# Patient Record
Sex: Female | Born: 1970 | Race: White | Hispanic: No | Marital: Single | Smoking: Current every day smoker
Health system: Southern US, Community
[De-identification: ages and names within clinical notes are randomized; demographics above are authoritative.]

## PROBLEM LIST (undated history)

## (undated) DIAGNOSIS — F32A Depression, unspecified: Secondary | ICD-10-CM

## (undated) DIAGNOSIS — J8410 Pulmonary fibrosis, unspecified: Secondary | ICD-10-CM

## (undated) DIAGNOSIS — J189 Pneumonia, unspecified organism: Secondary | ICD-10-CM

## (undated) DIAGNOSIS — E039 Hypothyroidism, unspecified: Secondary | ICD-10-CM

## (undated) DIAGNOSIS — K219 Gastro-esophageal reflux disease without esophagitis: Secondary | ICD-10-CM

## (undated) DIAGNOSIS — N3010 Interstitial cystitis (chronic) without hematuria: Secondary | ICD-10-CM

## (undated) DIAGNOSIS — IMO0002 Reserved for concepts with insufficient information to code with codable children: Secondary | ICD-10-CM

## (undated) DIAGNOSIS — F319 Bipolar disorder, unspecified: Secondary | ICD-10-CM

## (undated) DIAGNOSIS — F419 Anxiety disorder, unspecified: Secondary | ICD-10-CM

## (undated) DIAGNOSIS — F329 Major depressive disorder, single episode, unspecified: Secondary | ICD-10-CM

## (undated) DIAGNOSIS — L409 Psoriasis, unspecified: Secondary | ICD-10-CM

## (undated) DIAGNOSIS — J45909 Unspecified asthma, uncomplicated: Secondary | ICD-10-CM

## (undated) DIAGNOSIS — R569 Unspecified convulsions: Secondary | ICD-10-CM

## (undated) HISTORY — PX: WISDOM TOOTH EXTRACTION: SHX21

## (undated) HISTORY — PX: OTHER SURGICAL HISTORY: SHX169

## (undated) HISTORY — PX: TOTAL KNEE ARTHROPLASTY: SHX125

---

## 1998-02-28 ENCOUNTER — Emergency Department (HOSPITAL_COMMUNITY): Admission: EM | Admit: 1998-02-28 | Discharge: 1998-02-28 | Payer: Self-pay | Admitting: Emergency Medicine

## 1998-04-10 ENCOUNTER — Encounter: Admission: RE | Admit: 1998-04-10 | Discharge: 1998-07-09 | Payer: Self-pay | Admitting: Orthopedic Surgery

## 1998-04-12 ENCOUNTER — Other Ambulatory Visit: Admission: RE | Admit: 1998-04-12 | Discharge: 1998-04-12 | Payer: Self-pay | Admitting: *Deleted

## 1998-04-17 ENCOUNTER — Other Ambulatory Visit: Admission: RE | Admit: 1998-04-17 | Discharge: 1998-04-17 | Payer: Self-pay | Admitting: *Deleted

## 1998-04-24 ENCOUNTER — Ambulatory Visit (HOSPITAL_COMMUNITY): Admission: RE | Admit: 1998-04-24 | Discharge: 1998-04-24 | Payer: Self-pay | Admitting: *Deleted

## 1998-06-25 ENCOUNTER — Emergency Department (HOSPITAL_COMMUNITY): Admission: EM | Admit: 1998-06-25 | Discharge: 1998-06-25 | Payer: Self-pay | Admitting: Emergency Medicine

## 1998-06-27 ENCOUNTER — Other Ambulatory Visit: Admission: RE | Admit: 1998-06-27 | Discharge: 1998-06-27 | Payer: Self-pay | Admitting: *Deleted

## 1998-07-02 ENCOUNTER — Inpatient Hospital Stay: Admission: EM | Admit: 1998-07-02 | Discharge: 1998-07-03 | Payer: Self-pay | Admitting: *Deleted

## 1998-07-04 ENCOUNTER — Ambulatory Visit (HOSPITAL_COMMUNITY): Admission: RE | Admit: 1998-07-04 | Discharge: 1998-07-04 | Payer: Self-pay | Admitting: Urology

## 1998-07-13 ENCOUNTER — Ambulatory Visit (HOSPITAL_COMMUNITY): Admission: RE | Admit: 1998-07-13 | Discharge: 1998-07-13 | Payer: Self-pay | Admitting: Urology

## 1998-07-23 ENCOUNTER — Emergency Department (HOSPITAL_COMMUNITY): Admission: EM | Admit: 1998-07-23 | Discharge: 1998-07-23 | Payer: Self-pay | Admitting: Emergency Medicine

## 1998-10-12 ENCOUNTER — Emergency Department (HOSPITAL_COMMUNITY): Admission: EM | Admit: 1998-10-12 | Discharge: 1998-10-12 | Payer: Self-pay | Admitting: Emergency Medicine

## 1998-10-12 ENCOUNTER — Encounter: Payer: Self-pay | Admitting: Emergency Medicine

## 1998-10-18 ENCOUNTER — Encounter: Payer: Self-pay | Admitting: *Deleted

## 1998-10-18 ENCOUNTER — Ambulatory Visit (HOSPITAL_COMMUNITY): Admission: RE | Admit: 1998-10-18 | Discharge: 1998-10-18 | Payer: Self-pay | Admitting: *Deleted

## 1998-11-13 ENCOUNTER — Encounter: Admission: RE | Admit: 1998-11-13 | Discharge: 1999-02-11 | Payer: Self-pay

## 1999-01-24 ENCOUNTER — Ambulatory Visit (HOSPITAL_COMMUNITY): Admission: RE | Admit: 1999-01-24 | Discharge: 1999-01-24 | Payer: Self-pay | Admitting: *Deleted

## 1999-02-01 ENCOUNTER — Ambulatory Visit (HOSPITAL_COMMUNITY): Admission: RE | Admit: 1999-02-01 | Discharge: 1999-02-01 | Payer: Self-pay | Admitting: Pulmonary Disease

## 1999-02-01 ENCOUNTER — Encounter: Payer: Self-pay | Admitting: Pulmonary Disease

## 1999-02-05 ENCOUNTER — Ambulatory Visit: Admission: RE | Admit: 1999-02-05 | Discharge: 1999-02-05 | Payer: Self-pay | Admitting: Pulmonary Disease

## 1999-04-01 ENCOUNTER — Encounter: Admission: RE | Admit: 1999-04-01 | Discharge: 1999-05-10 | Payer: Self-pay | Admitting: *Deleted

## 1999-04-04 ENCOUNTER — Ambulatory Visit (HOSPITAL_COMMUNITY): Admission: RE | Admit: 1999-04-04 | Discharge: 1999-04-04 | Payer: Self-pay | Admitting: *Deleted

## 1999-04-04 ENCOUNTER — Encounter: Payer: Self-pay | Admitting: *Deleted

## 1999-04-26 ENCOUNTER — Emergency Department (HOSPITAL_COMMUNITY): Admission: EM | Admit: 1999-04-26 | Discharge: 1999-04-26 | Payer: Self-pay | Admitting: Emergency Medicine

## 1999-08-27 ENCOUNTER — Encounter: Payer: Self-pay | Admitting: Emergency Medicine

## 1999-08-27 ENCOUNTER — Emergency Department (HOSPITAL_COMMUNITY): Admission: EM | Admit: 1999-08-27 | Discharge: 1999-08-27 | Payer: Self-pay | Admitting: Emergency Medicine

## 1999-10-08 ENCOUNTER — Encounter: Payer: Self-pay | Admitting: *Deleted

## 1999-10-08 ENCOUNTER — Inpatient Hospital Stay (HOSPITAL_COMMUNITY): Admission: RE | Admit: 1999-10-08 | Discharge: 1999-10-11 | Payer: Self-pay | Admitting: *Deleted

## 1999-10-10 ENCOUNTER — Encounter: Payer: Self-pay | Admitting: Pulmonary Disease

## 1999-10-17 ENCOUNTER — Encounter: Admission: RE | Admit: 1999-10-17 | Discharge: 2000-01-15 | Payer: Self-pay | Admitting: Specialist

## 2000-03-04 ENCOUNTER — Ambulatory Visit (HOSPITAL_COMMUNITY): Admission: RE | Admit: 2000-03-04 | Discharge: 2000-03-04 | Payer: Self-pay | Admitting: Urology

## 2000-05-04 ENCOUNTER — Ambulatory Visit (HOSPITAL_COMMUNITY): Admission: RE | Admit: 2000-05-04 | Discharge: 2000-05-04 | Payer: Self-pay | Admitting: *Deleted

## 2000-05-04 ENCOUNTER — Encounter: Payer: Self-pay | Admitting: *Deleted

## 2000-06-05 ENCOUNTER — Encounter: Admission: RE | Admit: 2000-06-05 | Discharge: 2000-06-05 | Payer: Self-pay | Admitting: Specialist

## 2000-07-27 ENCOUNTER — Encounter: Payer: Self-pay | Admitting: Specialist

## 2000-07-27 ENCOUNTER — Encounter: Admission: RE | Admit: 2000-07-27 | Discharge: 2000-07-27 | Payer: Self-pay | Admitting: Specialist

## 2000-07-30 ENCOUNTER — Encounter: Admission: RE | Admit: 2000-07-30 | Discharge: 2000-07-30 | Payer: Self-pay | Admitting: Specialist

## 2000-07-30 ENCOUNTER — Encounter: Payer: Self-pay | Admitting: Specialist

## 2000-08-25 ENCOUNTER — Emergency Department (HOSPITAL_COMMUNITY): Admission: EM | Admit: 2000-08-25 | Discharge: 2000-08-25 | Payer: Self-pay | Admitting: Emergency Medicine

## 2003-05-09 ENCOUNTER — Encounter: Admission: RE | Admit: 2003-05-09 | Discharge: 2003-06-07 | Payer: Self-pay | Admitting: Family Medicine

## 2003-07-12 ENCOUNTER — Encounter: Admission: RE | Admit: 2003-07-12 | Discharge: 2003-09-13 | Payer: Self-pay | Admitting: Orthopaedic Surgery

## 2004-03-25 ENCOUNTER — Encounter: Admission: RE | Admit: 2004-03-25 | Discharge: 2004-05-20 | Payer: Self-pay | Admitting: Orthopaedic Surgery

## 2004-05-06 ENCOUNTER — Encounter: Admission: RE | Admit: 2004-05-06 | Discharge: 2004-05-06 | Payer: Self-pay | Admitting: Orthopaedic Surgery

## 2004-05-23 ENCOUNTER — Ambulatory Visit (HOSPITAL_COMMUNITY): Admission: RE | Admit: 2004-05-23 | Discharge: 2004-05-23 | Payer: Self-pay | Admitting: *Deleted

## 2004-06-04 ENCOUNTER — Ambulatory Visit (HOSPITAL_COMMUNITY): Admission: RE | Admit: 2004-06-04 | Discharge: 2004-06-04 | Payer: Self-pay | Admitting: Urology

## 2004-06-18 ENCOUNTER — Ambulatory Visit (HOSPITAL_COMMUNITY): Admission: RE | Admit: 2004-06-18 | Discharge: 2004-06-18 | Payer: Self-pay | Admitting: Urology

## 2004-09-10 ENCOUNTER — Ambulatory Visit (HOSPITAL_COMMUNITY): Admission: RE | Admit: 2004-09-10 | Discharge: 2004-09-10 | Payer: Self-pay | Admitting: Urology

## 2004-12-24 ENCOUNTER — Encounter: Admission: RE | Admit: 2004-12-24 | Discharge: 2005-02-20 | Payer: Self-pay

## 2005-03-05 ENCOUNTER — Encounter: Admission: RE | Admit: 2005-03-05 | Discharge: 2005-03-18 | Payer: Self-pay | Admitting: Orthopaedic Surgery

## 2005-04-01 ENCOUNTER — Encounter: Admission: RE | Admit: 2005-04-01 | Discharge: 2005-05-16 | Payer: Self-pay | Admitting: Orthopaedic Surgery

## 2005-05-22 ENCOUNTER — Encounter: Admission: RE | Admit: 2005-05-22 | Discharge: 2005-06-24 | Payer: Self-pay | Admitting: Orthopaedic Surgery

## 2005-08-12 ENCOUNTER — Ambulatory Visit (HOSPITAL_COMMUNITY): Admission: RE | Admit: 2005-08-12 | Discharge: 2005-08-12 | Payer: Self-pay | Admitting: Urology

## 2005-09-19 ENCOUNTER — Ambulatory Visit (HOSPITAL_COMMUNITY): Admission: RE | Admit: 2005-09-19 | Discharge: 2005-09-19 | Payer: Self-pay | Admitting: Surgery

## 2006-03-31 ENCOUNTER — Ambulatory Visit (HOSPITAL_BASED_OUTPATIENT_CLINIC_OR_DEPARTMENT_OTHER): Admission: RE | Admit: 2006-03-31 | Discharge: 2006-03-31 | Payer: Self-pay | Admitting: Urology

## 2006-04-01 ENCOUNTER — Encounter: Payer: Self-pay | Admitting: Emergency Medicine

## 2006-04-30 ENCOUNTER — Encounter: Admission: RE | Admit: 2006-04-30 | Discharge: 2006-07-28 | Payer: Self-pay | Admitting: Orthopaedic Surgery

## 2006-10-28 ENCOUNTER — Encounter: Admission: RE | Admit: 2006-10-28 | Discharge: 2006-11-25 | Payer: Self-pay | Admitting: Orthopaedic Surgery

## 2006-11-26 ENCOUNTER — Encounter: Admission: RE | Admit: 2006-11-26 | Discharge: 2007-01-04 | Payer: Self-pay | Admitting: Orthopaedic Surgery

## 2006-12-18 ENCOUNTER — Encounter: Admission: RE | Admit: 2006-12-18 | Discharge: 2006-12-18 | Payer: Self-pay | Admitting: Orthopaedic Surgery

## 2007-03-04 ENCOUNTER — Encounter: Admission: RE | Admit: 2007-03-04 | Discharge: 2007-05-04 | Payer: Self-pay | Admitting: Orthopaedic Surgery

## 2007-12-22 ENCOUNTER — Encounter: Admission: RE | Admit: 2007-12-22 | Discharge: 2007-12-22 | Payer: Self-pay | Admitting: Orthopedic Surgery

## 2008-01-06 ENCOUNTER — Encounter: Admission: RE | Admit: 2008-01-06 | Discharge: 2008-02-02 | Payer: Self-pay | Admitting: Orthopedic Surgery

## 2009-02-21 ENCOUNTER — Emergency Department (HOSPITAL_COMMUNITY): Admission: EM | Admit: 2009-02-21 | Discharge: 2009-02-22 | Payer: Self-pay | Admitting: Emergency Medicine

## 2009-05-23 ENCOUNTER — Emergency Department (HOSPITAL_BASED_OUTPATIENT_CLINIC_OR_DEPARTMENT_OTHER): Admission: EM | Admit: 2009-05-23 | Discharge: 2009-05-23 | Payer: Self-pay | Admitting: Emergency Medicine

## 2009-06-18 ENCOUNTER — Emergency Department (HOSPITAL_BASED_OUTPATIENT_CLINIC_OR_DEPARTMENT_OTHER): Admission: EM | Admit: 2009-06-18 | Discharge: 2009-06-18 | Payer: Self-pay | Admitting: Emergency Medicine

## 2009-06-21 ENCOUNTER — Emergency Department (HOSPITAL_COMMUNITY): Admission: EM | Admit: 2009-06-21 | Discharge: 2009-06-21 | Payer: Self-pay | Admitting: Emergency Medicine

## 2009-06-28 ENCOUNTER — Emergency Department (HOSPITAL_COMMUNITY): Admission: EM | Admit: 2009-06-28 | Discharge: 2009-06-28 | Payer: Self-pay | Admitting: Emergency Medicine

## 2009-06-28 ENCOUNTER — Inpatient Hospital Stay (HOSPITAL_COMMUNITY): Admission: EM | Admit: 2009-06-28 | Discharge: 2009-07-01 | Payer: Self-pay | Admitting: Emergency Medicine

## 2009-06-29 ENCOUNTER — Ambulatory Visit: Payer: Self-pay | Admitting: Internal Medicine

## 2009-07-31 ENCOUNTER — Telehealth: Payer: Self-pay | Admitting: Internal Medicine

## 2009-09-04 ENCOUNTER — Emergency Department (HOSPITAL_BASED_OUTPATIENT_CLINIC_OR_DEPARTMENT_OTHER): Admission: EM | Admit: 2009-09-04 | Discharge: 2009-09-04 | Payer: Self-pay | Admitting: Emergency Medicine

## 2009-09-04 ENCOUNTER — Ambulatory Visit: Payer: Self-pay | Admitting: Diagnostic Radiology

## 2009-12-17 ENCOUNTER — Emergency Department (HOSPITAL_COMMUNITY): Admission: EM | Admit: 2009-12-17 | Discharge: 2009-12-17 | Payer: Self-pay | Admitting: Emergency Medicine

## 2010-01-01 ENCOUNTER — Encounter: Payer: Self-pay | Admitting: Internal Medicine

## 2010-01-01 ENCOUNTER — Encounter: Admission: RE | Admit: 2010-01-01 | Discharge: 2010-01-01 | Payer: Self-pay | Admitting: Rheumatology

## 2010-01-28 ENCOUNTER — Encounter: Admission: RE | Admit: 2010-01-28 | Discharge: 2010-03-11 | Payer: Self-pay | Admitting: Orthopaedic Surgery

## 2010-01-30 ENCOUNTER — Emergency Department (HOSPITAL_COMMUNITY): Admission: EM | Admit: 2010-01-30 | Discharge: 2010-01-30 | Payer: Self-pay | Admitting: Emergency Medicine

## 2010-03-01 ENCOUNTER — Telehealth: Payer: Self-pay | Admitting: Internal Medicine

## 2010-03-04 ENCOUNTER — Ambulatory Visit: Payer: Self-pay | Admitting: Internal Medicine

## 2010-03-04 DIAGNOSIS — J8410 Pulmonary fibrosis, unspecified: Secondary | ICD-10-CM | POA: Insufficient documentation

## 2010-03-04 DIAGNOSIS — F17210 Nicotine dependence, cigarettes, uncomplicated: Secondary | ICD-10-CM | POA: Insufficient documentation

## 2010-03-04 DIAGNOSIS — M329 Systemic lupus erythematosus, unspecified: Secondary | ICD-10-CM | POA: Insufficient documentation

## 2010-03-04 DIAGNOSIS — L408 Other psoriasis: Secondary | ICD-10-CM | POA: Insufficient documentation

## 2010-03-04 DIAGNOSIS — J45909 Unspecified asthma, uncomplicated: Secondary | ICD-10-CM | POA: Insufficient documentation

## 2010-03-10 ENCOUNTER — Emergency Department (HOSPITAL_BASED_OUTPATIENT_CLINIC_OR_DEPARTMENT_OTHER): Admission: EM | Admit: 2010-03-10 | Discharge: 2010-03-11 | Payer: Self-pay | Admitting: Emergency Medicine

## 2010-04-17 ENCOUNTER — Ambulatory Visit: Payer: Self-pay | Admitting: Internal Medicine

## 2010-04-27 ENCOUNTER — Emergency Department (HOSPITAL_BASED_OUTPATIENT_CLINIC_OR_DEPARTMENT_OTHER): Admission: EM | Admit: 2010-04-27 | Discharge: 2010-04-28 | Payer: Self-pay | Admitting: Emergency Medicine

## 2010-07-17 IMAGING — CR DG WRIST COMPLETE 3+V*R*
3 series · 3 of 3 positions shown · non-contrast
Comparison: None

CLINICAL DATA: Pain radial aspect right wrist, swelling right arm,
no known injury

RIGHT WRIST - COMPLETE 3 VIEWS

[x wrist pa right]
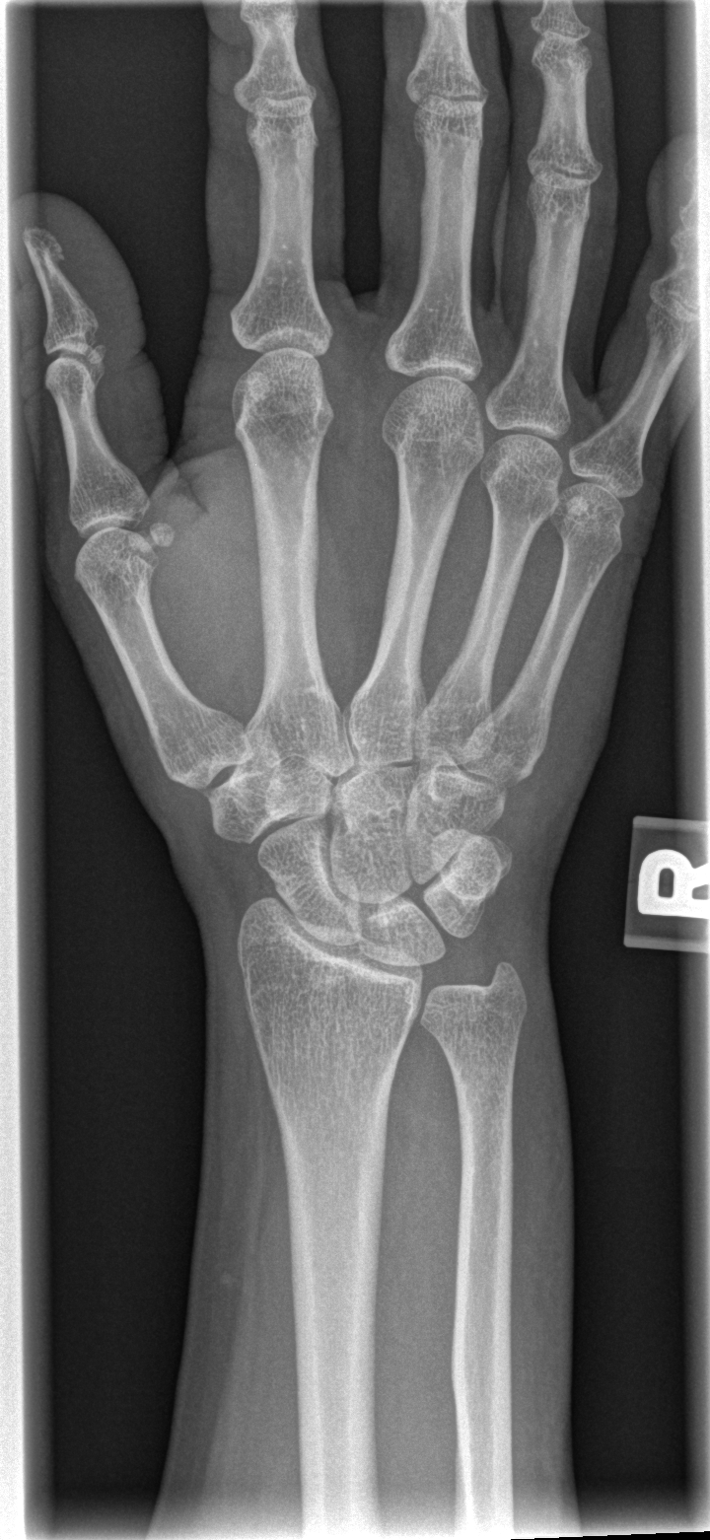

[x wrist obl right]
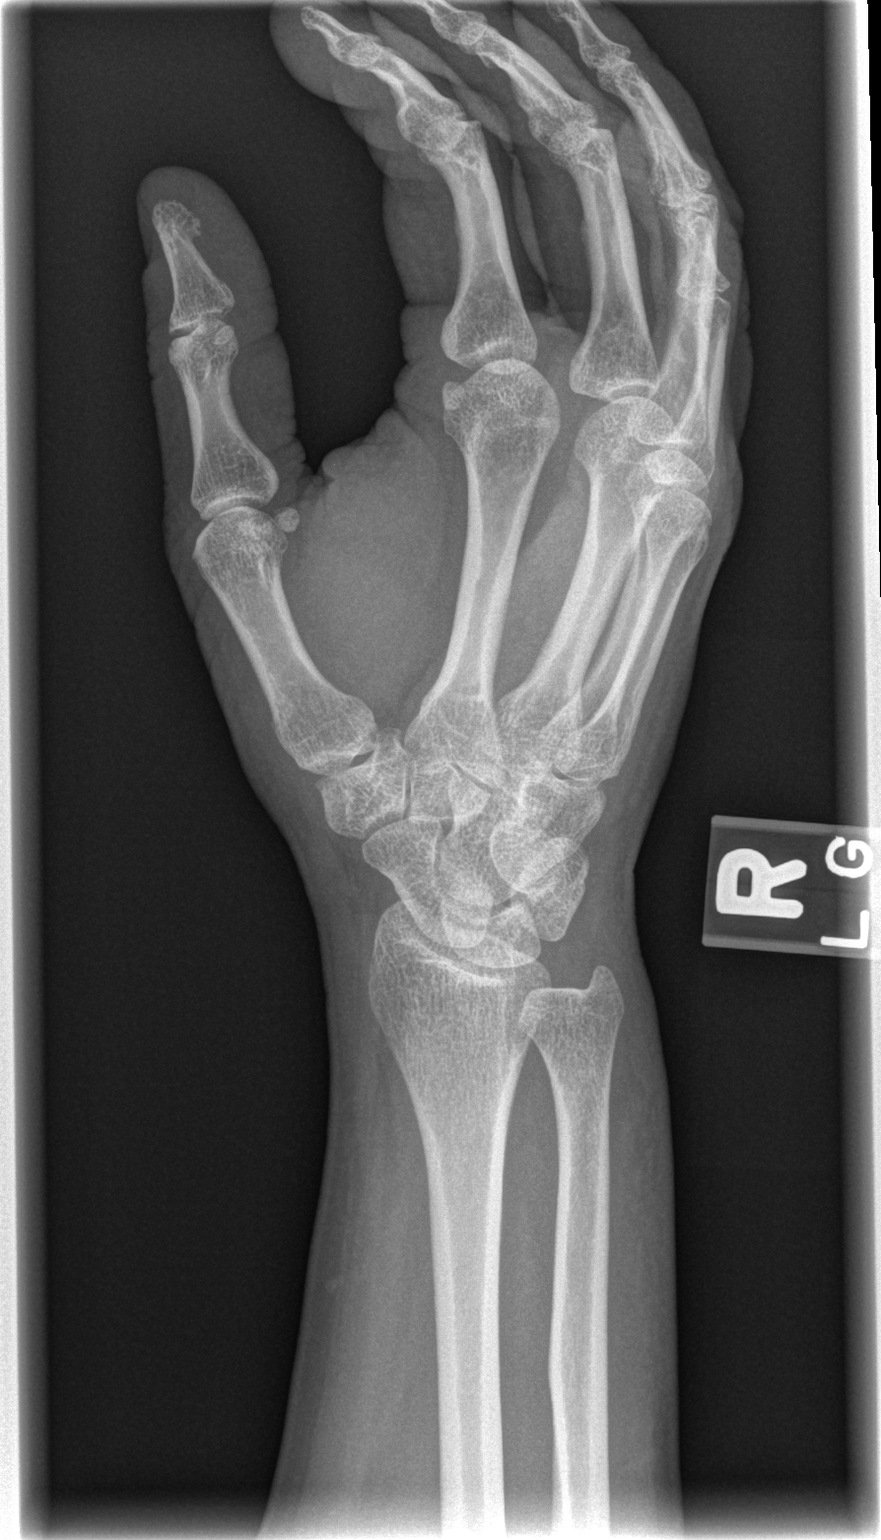

[x wrist lat right]
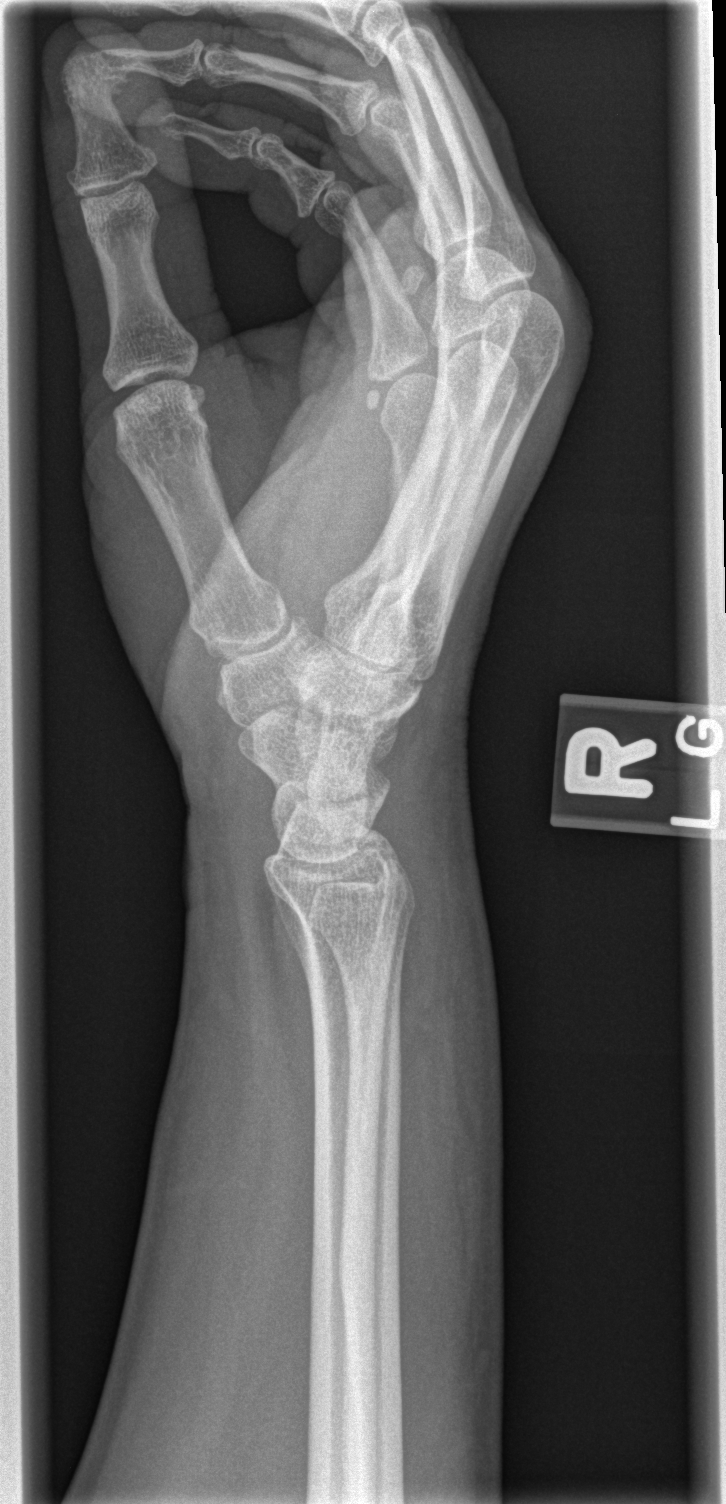

[3 of 3 positions shown; findings below may reference images not displayed]

FINDINGS: Bone mineralization normal.
Joint spaces preserved.
No acute fracture, dislocation, or bone destruction.
IMPRESSION: No acute abnormalities.

## 2010-08-14 ENCOUNTER — Encounter: Admission: RE | Admit: 2010-08-14 | Discharge: 2010-10-10 | Payer: Self-pay | Admitting: Orthopaedic Surgery

## 2010-08-14 ENCOUNTER — Encounter: Admission: RE | Admit: 2010-08-14 | Discharge: 2010-08-14 | Payer: Self-pay | Admitting: Orthopedic Surgery

## 2010-11-13 IMAGING — CR DG CHEST 2V
2 series · 2 of 2 positions shown · non-contrast
Comparison: The report of 10/10/1999

CLINICAL DATA: Chest pain.  Shortness of breath.  Possible
poisoning.  Nausea vomiting.

CHEST - 2 VIEW

[w chest pa]
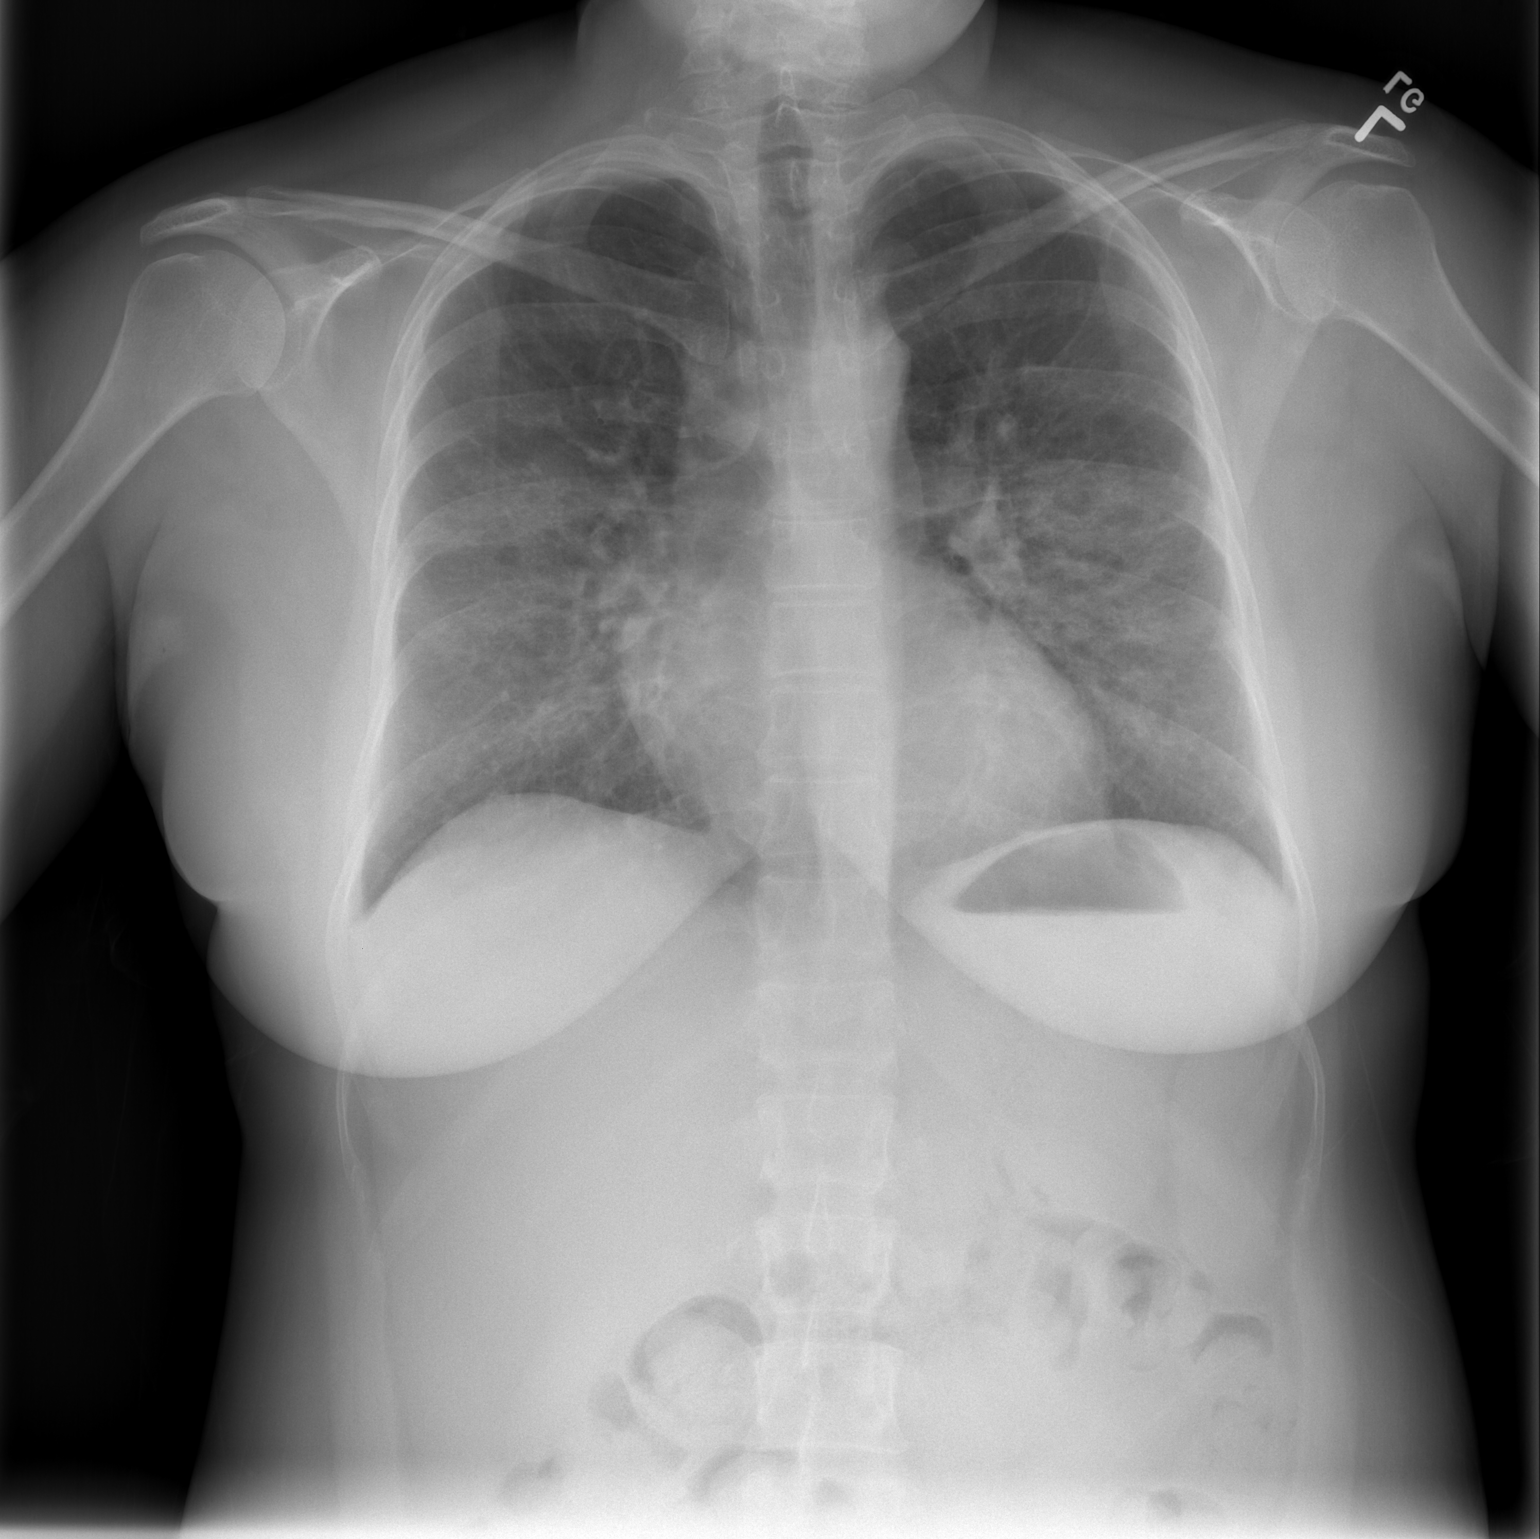

[w chest lat]
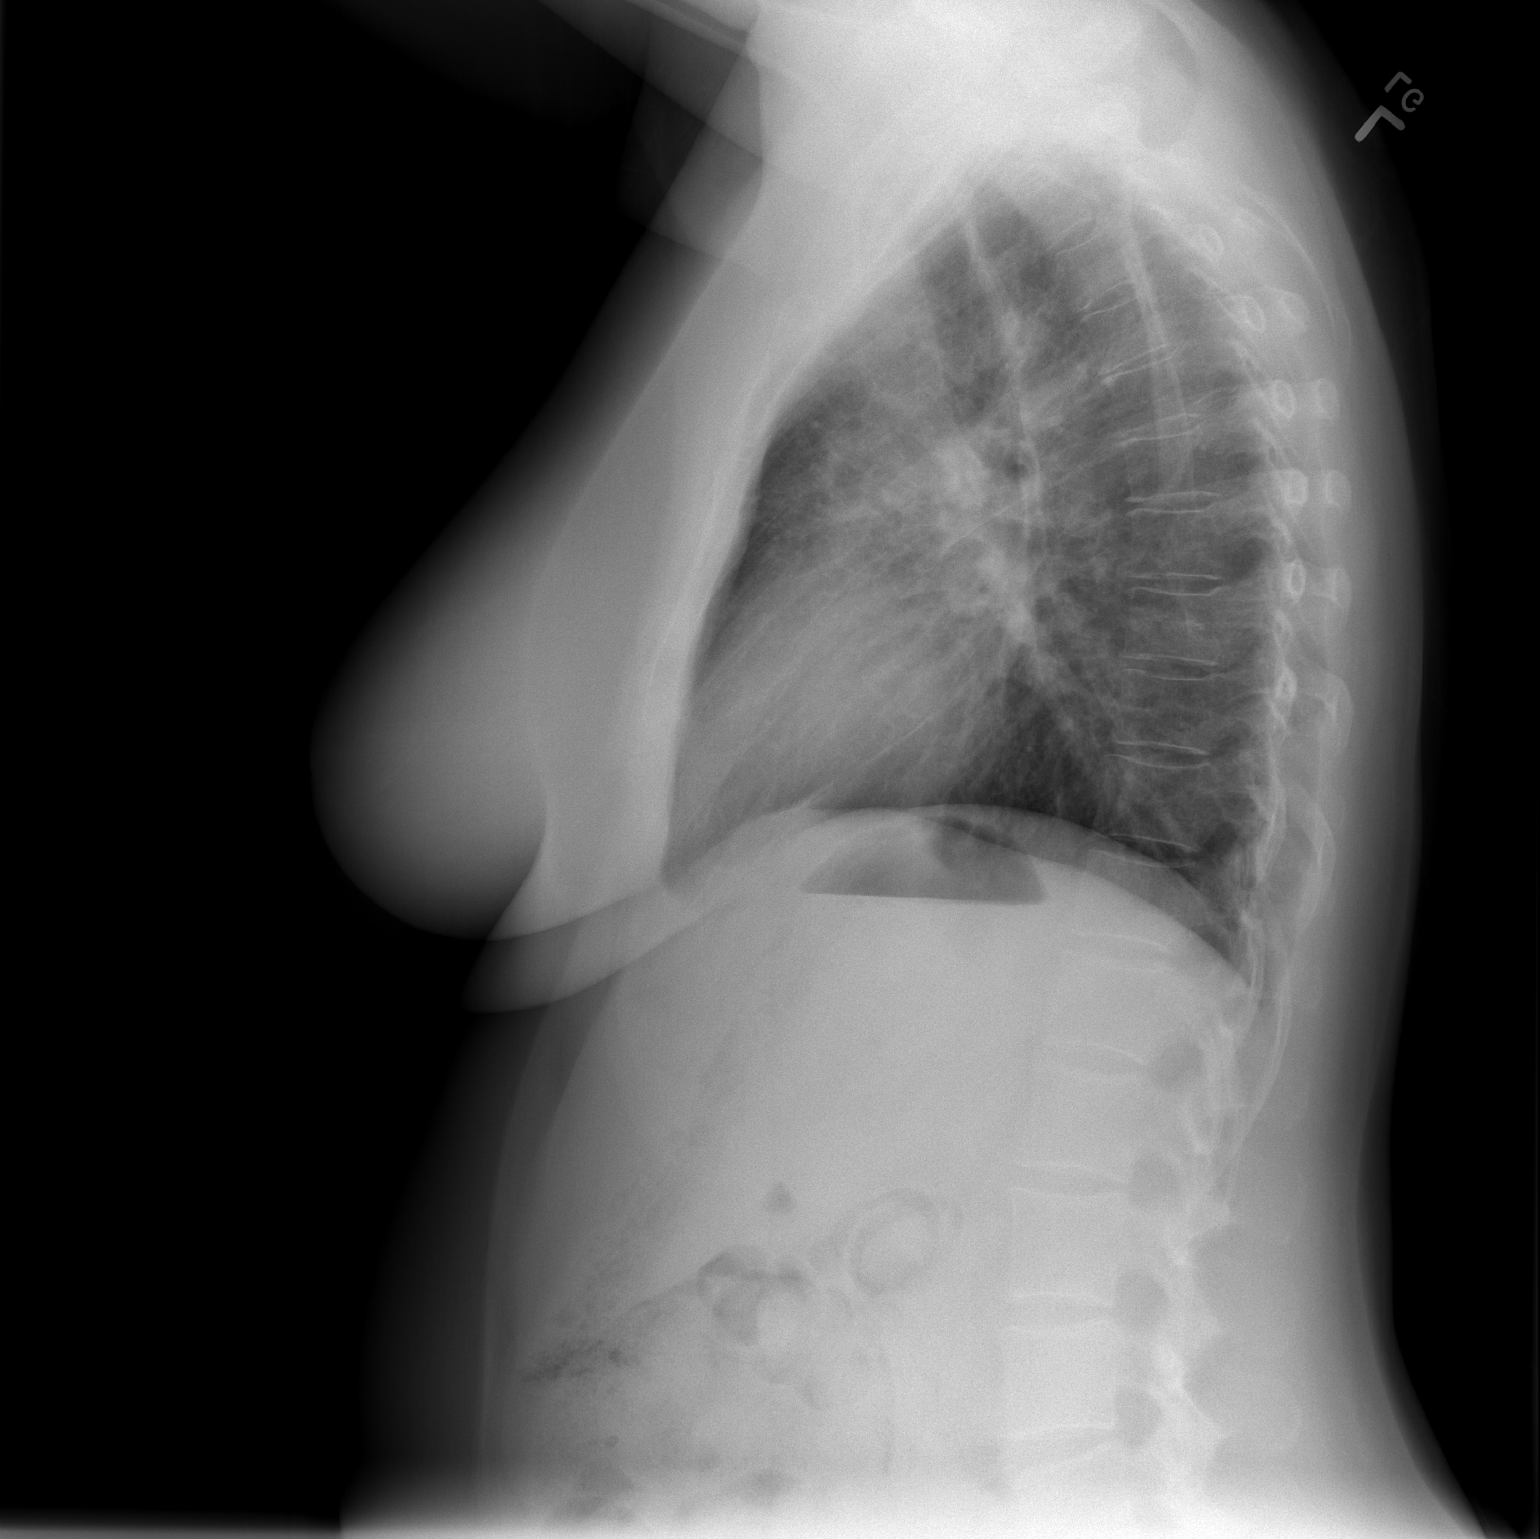

[2 of 2 positions shown; findings below may reference images not displayed]

FINDINGS: Suspect mild osteopenia. Midline trachea. Normal heart
size.  No definite mediastinal or hilar adenopathy.  Biapical
pleural thickening. No pleural effusion or pneumothorax. Bilateral,
left greater than right interstitial opacities.  These are
primarily perihilar in nature.  There may be concurrent airspace
disease in the perihilar lungs bilaterally.
IMPRESSION: Interstitial and possibly airspace opacities in a bilateral,
perihilar distribution.   Possible etiologies include  viral or
atypical bacterial (i.e. mycoplasma) infection, pulmonary edema,
and acute hypersensitivity.  If the patient's symptoms are chronic,
interstitial lung disease (sarcoidosis) should also be considered.

## 2010-11-28 ENCOUNTER — Encounter
Admission: RE | Admit: 2010-11-28 | Discharge: 2010-11-28 | Payer: Self-pay | Source: Home / Self Care | Attending: Family Medicine | Admitting: Family Medicine

## 2011-01-02 NOTE — Letter (Signed)
Summary: [facility]  [facility]   Imported By: Sherian Rein 03/13/2010 08:28:07  _____________________________________________________________________  External Attachment:    Type:   Image     Comment:   External Document

## 2011-01-02 NOTE — Progress Notes (Signed)
Summary: fax request (after 03/04/10) dr deveshwar's office  Phone Note From Other Clinic   Caller: dr deveshwar's office Call For: Tobey Lippard Summary of Call: please fax ov notes (after consult w/ dr Ada Holness 03/04/10) to dr Celine Mans deveshwar's office. fax # [phone]. contact # is [phone] Initial call taken by: Tivis Ringer, CNA,  March 01, 2010 10:25 AM  Follow-up for Phone Call        sent a reminder flag to leslie for 03-04-10 to fax ov note once pt is seen. Carron Curie CMA  March 01, 2010 10:43 AM

## 2011-01-02 NOTE — Assessment & Plan Note (Signed)
Summary: Pulmonary/ ? ILD    History of Present Illness: 38 yowf active smoker with bad arthritis being consiered for MTX with ? SLE  but ? of ILD so referred to pulmonary clinic March 04, 2010   March 04, 2010 cc ? ild no limitations can walk slow pace up hill and steps with minimal dry cough and no noct c/os.  Pt denies any significant sore throat, dysphagia, itching, sneezing,  nasal congestion or excess secretions,  fever, chills, sweats, unintended wt loss, pleuritic or exertional cp, hempoptysis, change in activity tolerance  orthopnea pnd or leg swelling.  Pt also denies any obvious fluctuation in symptoms with weather or environmental change or other alleviating or aggravating factors.    Carries dx of asthma but says can't tell ventolin helps her and doesn't use it.     Current Medications (verified): 1)  Humira Pen 40 Mg/0.80ml Kit (Adalimumab) .... As Directed 2)  Desmopressin Acetate 0.2 Mg Tabs (Desmopressin Acetate) .... 2 At Bedtime 3)  Abilify 20 Mg Tabs (Aripiprazole) .Marland Kitchen.. 1 Two Times A Day 4)  Seroquel 50 Mg Tabs (Quetiapine Fumarate) .Marland Kitchen.. 1 At Bedtime 5)  Prilosec 40 Mg Cpdr (Omeprazole) .Marland Kitchen.. 1 Once Daily 6)  Synthroid 50 Mcg Tabs (Levothyroxine Sodium) .Marland Kitchen.. 1 Once Daily 7)  Topamax 100 Mg Tabs (Topiramate) .Marland Kitchen.. 1 Once Daily 8)  Ventolin Hfa 108 (90 Base) Mcg/act Aers (Albuterol Sulfate) .... 2 Puffs Every 4-6 Hours As Needed 9)  Ms Contin 30 Mg Xr12h-Tab (Morphine Sulfate) .Marland Kitchen.. 1 Three Times A Day 10)  Norco 7.5-325 Mg Tabs (Hydrocodone-Acetaminophen) .Marland Kitchen.. 1 Every 12 Hours As Needed 11)  Xanax 0.5 Mg Tabs (Alprazolam) .Marland Kitchen.. 1 Four Times A Day  Allergies (verified): 1)  ! Doxycycline  Past History:  Past Medical History: LUPUS (ICD-710.0) ASTHMA (ICD-493.90) PSORIASIS (ICD-696.1)  Family History: Negative for respiratory diseases or atopy   Social History: Current smoker since age 37.  Smokes 1/2 ppd.  Review of Systems       The patient complains of  non-productive cough, acid heartburn, indigestion, weight change, anxiety, depression, and joint stiffness or pain.  The patient denies shortness of breath with activity, shortness of breath at rest, productive cough, coughing up blood, chest pain, irregular heartbeats, loss of appetite, abdominal pain, difficulty swallowing, sore throat, tooth/dental problems, headaches, nasal congestion/difficulty breathing through nose, sneezing, itching, ear ache, hand/feet swelling, rash, change in color of mucus, and fever.    Vital Signs:  Patient profile:   40 year old female Height:      58 inches Weight:      115.50 pounds BMI:     24.23  Vitals Entered By: Vernie Murders (March 04, 2010 11:15 AM)  O2 Flow:  Room air  Physical Exam  Additional Exam:  amb wf nad wt 115 HEENT: nl dentition, turbinates, and orophanx. Nl external ear canals without cough reflex Neck without JVD/Nodes/TM Lungs clear to A and P bilaterally without cough on insp or exp maneuvers RRR no s3 or murmur or increase in P2 Abd soft and benign with nl excursion in the supine position. 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  For now the most pressing issue is the smoking.  Needs baseline pft's     Problem # 2:  SMOKER (ICD-305.1) Discussed but not ready to committ to quit at this point - emphasized risks involved in continuing smoking and that patient should consider these in the context of the cost of smoking relative to the benefit obtained.  Had luck with chantix previously but on psych meds with roomate who smokes so reluctant to re-rx without psych/social support in place.    Medications Added to Medication List This Visit: 1)  Humira Pen 40 Mg/0.42ml Kit (Adalimumab) .... As directed 2)  Desmopressin Acetate 0.2 Mg Tabs (Desmopressin acetate) .... 2 at bedtime 3)  Abilify 20 Mg Tabs (Aripiprazole) .Marland Kitchen.. 1 two times a day 4)  Seroquel 50 Mg Tabs (Quetiapine fumarate) .Marland Kitchen.. 1 at bedtime 5)  Prilosec 40 Mg Cpdr (Omeprazole) .Marland Kitchen.. 1 once daily 6)  Synthroid 50 Mcg Tabs (Levothyroxine sodium) .Marland Kitchen.. 1 once daily 7)  Topamax 100 Mg Tabs (Topiramate) .Marland Kitchen.. 1 once daily 8)  Ventolin Hfa 108 (90 Base) Mcg/act Aers (Albuterol sulfate) .... 2 puffs every 4-6 hours as needed 9)  Ms Contin 30 Mg Xr12h-tab (Morphine sulfate) .Marland Kitchen.. 1 three times a day 10)  Norco 7.5-325 Mg Tabs (Hydrocodone-acetaminophen) .Marland Kitchen.. 1 every 12 hours as needed 11)  Xanax 0.5 Mg Tabs (Alprazolam) .Marland Kitchen.. 1 four times a day  Other Orders: New Patient Level V (40981)  Patient Instructions: 1)  Committ to quit smoking if at all possible 2)  Please schedule a follow-up appointment in 6 weeks with cxr and PFTs

## 2011-01-02 NOTE — Miscellaneous (Signed)
Summary: Orders Update pft charges  Clinical Lists Changes  Orders: Added new Service order of Spirometry (Pre & Post) (94060) - Signed 

## 2011-01-02 NOTE — Assessment & Plan Note (Signed)
Summary: Pulmonary/ ext f/u ov with no sign desats x 3 laps   Primary Provider/Referring Provider:  Dr. Knox Royalty  CC:  Followup with cxr and spirometry.  Pt states that her breathing is fine and denies any complaints today.  She stopped smoking on 03/15/10.  Marland Kitchen  History of Present Illness: 58 yowf active smoker with bad arthritis being consiered for MTX with ? SLE  but ? of ILD so referred to pulmonary clinic March 04, 2010   March 04, 2010 cc ? ild no limitations can walk slow pace up hill and steps with minimal dry cough and no noct c/os.    Carries dx of asthma but says can't tell ventolin helps her and doesn't use it much.     Apr 17, 2010 Followup with cxr and spirometry.  Pt states that her breathing is fine and denies any complaints today.  She stopped smoking on 03/15/10.  limited by arthritis.  no cough. Pt denies any significant sore throat, dysphagia, itching, sneezing,  nasal congestion or excess secretions,  fever, chills, sweats, unintended wt loss, pleuritic or exertional cp, hempoptysis, change in activity tolerance  orthopnea pnd or leg swelling. Pt also denies any obvious fluctuation in symptoms with weather or environmental change or other alleviating or aggravating factors.       . Current Medications (verified): 1)  Humira Pen 40 Mg/0.60ml Kit (Adalimumab) .... As Directed 2)  Desmopressin Acetate 0.2 Mg Tabs (Desmopressin Acetate) .... 2 At Bedtime 3)  Abilify 20 Mg Tabs (Aripiprazole) .Marland Kitchen.. 1 Two Times A Day 4)  Seroquel 50 Mg Tabs (Quetiapine Fumarate) .Marland Kitchen.. 1 At Bedtime 5)  Prilosec 40 Mg Cpdr (Omeprazole) .Marland Kitchen.. 1 Once Daily 6)  Synthroid 50 Mcg Tabs (Levothyroxine Sodium) .Marland Kitchen.. 1 Once Daily 7)  Topamax 100 Mg Tabs (Topiramate) .Marland Kitchen.. 1 Once Daily 8)  Ventolin Hfa 108 (90 Base) Mcg/act Aers (Albuterol Sulfate) .... 2 Puffs Every 4-6 Hours As Needed 9)  Ms Contin 30 Mg Xr12h-Tab (Morphine Sulfate) .Marland Kitchen.. 1 Three Times A Day 10)  Norco 7.5-325 Mg Tabs  (Hydrocodone-Acetaminophen) .Marland Kitchen.. 1 Every 12 Hours As Needed 11)  Xanax 0.5 Mg Tabs (Alprazolam) .Marland Kitchen.. 1 Four Times A Day  Allergies (verified): 1)  ! Doxycycline  Past History:  Past Medical History: LUPUS (ICD-710.0)     - Walking sats =  94 -91  rapid walk x 3 laps  Apr 17, 2010  ASTHMA (ICD-493.90)     - PFT's wnl Apr 17, 2010 no dlco PSORIASIS (ICD-696.1)  Social History: Current smoker since age 70.  Smokes 1/2 ppd. Pt states quit smoking 03/15/10-   Vital Signs:  Patient profile:   40 year old female Weight:      115 pounds BMI:     24.12 O2 Sat:      97 % on Room air Temp:     98.3 degrees F oral Pulse rate:   96 / minute BP sitting:   110 / 68  (left arm)  Vitals Entered By: Vernie Murders (Apr 17, 2010 10:56 AM)  O2 Flow:  Room air  Physical Exam  Additional Exam:  amb wf nad wt 115 > 115 Apr 17, 2010  HEENT: nl dentition, turbinates, and orophanx. Nl external ear canals without cough reflex Neck without JVD/Nodes/TM Lungs clear to A and P bilaterally without cough on insp or exp maneuvers RRR no s3 or murmur or increase in P2 Abd soft and benign with nl excursion in the supine position. 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 Contingencies discussed today including the rule of two's.   Key is no smoking  No evidence at all of copd at this point   I took this opportunity to emphasize  the consequences of smoking in airway disorders based on all the  data we have from the multiple national lung health studies indicating that smoking cessation, not choice of inhalers or physicians, is the most important aspect of care.    Other Orders: T-2 View CXR (71020TC) Pulse Oximetry, Ambulatory (16109) Est. Patient Level III (60454)  Patient Instructions: 1)  Your lung function is excellent and should serve you well unless you resume smoking 2)  Your rheumatism or medication for rheumatism can effect your lungs so you need PFT's yearly or immediately before considering any medications that can effect your lung function  Appended Document: Pulmonary/ ext f/u ov with no sign desats x 3 laps Ambulatory Pulse Oximetry  Resting; HR__96___    02 Sat__100%ra___  Lap1 (185 feet)   HR__126___   02 Sat__92%ra___ Lap2 (185 feet)   HR__126___   02 Sat__91%ra___    Lap3 (185 feet)   HR__120___   02 Sat__94%ra___  _x__Test Completed without Difficulty ___Test Stopped due to:

## 2011-01-11 ENCOUNTER — Emergency Department (INDEPENDENT_AMBULATORY_CARE_PROVIDER_SITE_OTHER): Payer: Medicare Other

## 2011-01-11 ENCOUNTER — Emergency Department (HOSPITAL_BASED_OUTPATIENT_CLINIC_OR_DEPARTMENT_OTHER)
Admission: EM | Admit: 2011-01-11 | Discharge: 2011-01-11 | Disposition: A | Discharge status: Home / Self Care | Payer: Medicare Other | Attending: Emergency Medicine | Admitting: Emergency Medicine

## 2011-01-11 DIAGNOSIS — F17200 Nicotine dependence, unspecified, uncomplicated: Secondary | ICD-10-CM | POA: Insufficient documentation

## 2011-01-11 DIAGNOSIS — X500XXA Overexertion from strenuous movement or load, initial encounter: Secondary | ICD-10-CM | POA: Insufficient documentation

## 2011-01-11 DIAGNOSIS — Z79899 Other long term (current) drug therapy: Secondary | ICD-10-CM | POA: Insufficient documentation

## 2011-01-11 DIAGNOSIS — G8929 Other chronic pain: Secondary | ICD-10-CM | POA: Insufficient documentation

## 2011-01-11 DIAGNOSIS — Y92009 Unspecified place in unspecified non-institutional (private) residence as the place of occurrence of the external cause: Secondary | ICD-10-CM | POA: Insufficient documentation

## 2011-01-11 DIAGNOSIS — W010XXA Fall on same level from slipping, tripping and stumbling without subsequent striking against object, initial encounter: Secondary | ICD-10-CM

## 2011-01-11 DIAGNOSIS — S93609A Unspecified sprain of unspecified foot, initial encounter: Secondary | ICD-10-CM

## 2011-01-11 DIAGNOSIS — F319 Bipolar disorder, unspecified: Secondary | ICD-10-CM | POA: Insufficient documentation

## 2011-01-11 DIAGNOSIS — J45909 Unspecified asthma, uncomplicated: Secondary | ICD-10-CM | POA: Insufficient documentation

## 2011-02-03 ENCOUNTER — Emergency Department (INDEPENDENT_AMBULATORY_CARE_PROVIDER_SITE_OTHER): Payer: Medicare Other

## 2011-02-03 ENCOUNTER — Emergency Department (HOSPITAL_BASED_OUTPATIENT_CLINIC_OR_DEPARTMENT_OTHER)
Admission: EM | Admit: 2011-02-03 | Discharge: 2011-02-03 | Disposition: A | Discharge status: Home / Self Care | Payer: Medicare Other | Attending: Emergency Medicine | Admitting: Emergency Medicine

## 2011-02-03 DIAGNOSIS — M545 Low back pain, unspecified: Secondary | ICD-10-CM | POA: Insufficient documentation

## 2011-02-03 DIAGNOSIS — S335XXA Sprain of ligaments of lumbar spine, initial encounter: Secondary | ICD-10-CM | POA: Insufficient documentation

## 2011-02-03 DIAGNOSIS — Y92410 Unspecified street and highway as the place of occurrence of the external cause: Secondary | ICD-10-CM | POA: Insufficient documentation

## 2011-02-03 DIAGNOSIS — Z79899 Other long term (current) drug therapy: Secondary | ICD-10-CM | POA: Insufficient documentation

## 2011-02-03 DIAGNOSIS — F3130 Bipolar disorder, current episode depressed, mild or moderate severity, unspecified: Secondary | ICD-10-CM | POA: Insufficient documentation

## 2011-02-03 DIAGNOSIS — J45909 Unspecified asthma, uncomplicated: Secondary | ICD-10-CM | POA: Insufficient documentation

## 2011-02-03 DIAGNOSIS — M549 Dorsalgia, unspecified: Secondary | ICD-10-CM

## 2011-02-03 DIAGNOSIS — S63509A Unspecified sprain of unspecified wrist, initial encounter: Secondary | ICD-10-CM | POA: Insufficient documentation

## 2011-02-03 DIAGNOSIS — M1990 Unspecified osteoarthritis, unspecified site: Secondary | ICD-10-CM | POA: Insufficient documentation

## 2011-02-03 DIAGNOSIS — M25539 Pain in unspecified wrist: Secondary | ICD-10-CM | POA: Insufficient documentation

## 2011-02-03 DIAGNOSIS — K219 Gastro-esophageal reflux disease without esophagitis: Secondary | ICD-10-CM | POA: Insufficient documentation

## 2011-02-03 DIAGNOSIS — IMO0002 Reserved for concepts with insufficient information to code with codable children: Secondary | ICD-10-CM | POA: Insufficient documentation

## 2011-02-17 LAB — URINALYSIS, ROUTINE W REFLEX MICROSCOPIC
Glucose, UA: NEGATIVE mg/dL
pH: 6 (ref 5.0–8.0)

## 2011-02-17 LAB — BASIC METABOLIC PANEL
Chloride: 96 mEq/L (ref 96–112)
GFR calc Af Amer: >60 mL/min (ref >60)
Potassium: 3.9 mEq/L (ref 3.5–5.1)

## 2011-02-19 LAB — URINE MICROSCOPIC-ADD ON

## 2011-02-19 LAB — URINALYSIS, ROUTINE W REFLEX MICROSCOPIC
Glucose, UA: NEGATIVE mg/dL
Hgb urine dipstick: NEGATIVE
Protein, ur: NEGATIVE mg/dL
Specific Gravity, Urine: 1.006 (ref 1.005–1.030)
pH: 8 (ref 5.0–8.0)

## 2011-02-19 LAB — URINE CULTURE

## 2011-03-06 LAB — D-DIMER, QUANTITATIVE: D-Dimer, Quant: 0.33 ug/mL-FEU (ref 0.00–0.48)

## 2011-03-08 LAB — CBC
HCT: 38 % (ref 36.0–46.0)
MCV: 91.6 fL (ref 78.0–100.0)
Platelets: 320 10*3/uL (ref 150–400)
RBC: 4.15 MIL/uL (ref 3.87–5.11)
WBC: 13 10*3/uL — ABNORMAL HIGH (ref 4.0–10.5)

## 2011-03-09 LAB — COMPREHENSIVE METABOLIC PANEL
ALT: 31 U/L (ref 0–35)
AST: 49 U/L — ABNORMAL HIGH (ref 0–37)
Alkaline Phosphatase: 144 U/L — ABNORMAL HIGH (ref 39–117)
BUN: 7 mg/dL (ref 6–23)
CO2: 19 mEq/L (ref 19–32)
CO2: 21 mEq/L (ref 19–32)
Chloride: 102 mEq/L (ref 96–112)
Creatinine, Ser: 0.71 mg/dL (ref 0.4–1.2)
GFR calc Af Amer: >60 mL/min (ref >60)
GFR calc non Af Amer: >60 mL/min (ref >60)
GFR calc non Af Amer: >60 mL/min (ref >60)
Glucose, Bld: 154 mg/dL — ABNORMAL HIGH (ref 70–99)
Glucose, Bld: 182 mg/dL — ABNORMAL HIGH (ref 70–99)
Potassium: 4.6 mEq/L (ref 3.5–5.1)
Sodium: 138 mEq/L (ref 135–145)
Total Bilirubin: 0.3 mg/dL (ref 0.3–1.2)
Total Bilirubin: 0.5 mg/dL (ref 0.3–1.2)

## 2011-03-09 LAB — DIFFERENTIAL
Basophils Absolute: 0 10*3/uL (ref 0.0–0.1)
Eosinophils Absolute: 0.2 10*3/uL (ref 0.0–0.7)
Eosinophils Absolute: 0.4 10*3/uL (ref 0.0–0.7)
Eosinophils Relative: 1 % (ref 0–5)
Eosinophils Relative: 3 % (ref 0–5)
Lymphocytes Relative: 19 % (ref 12–46)
Lymphs Abs: 0.5 10*3/uL — ABNORMAL LOW (ref 0.7–4.0)
Lymphs Abs: 2.8 10*3/uL (ref 0.7–4.0)
Lymphs Abs: 3.3 10*3/uL (ref 0.7–4.0)
Monocytes Absolute: 0.5 10*3/uL (ref 0.1–1.0)
Monocytes Relative: 1 % — ABNORMAL LOW (ref 3–12)
Monocytes Relative: 4 % (ref 3–12)
Neutrophils Relative %: 97 % — ABNORMAL HIGH (ref 43–77)

## 2011-03-09 LAB — POCT I-STAT, CHEM 8
BUN: 6 mg/dL (ref 6–23)
Calcium, Ion: 1.16 mmol/L (ref 1.12–1.32)
Creatinine, Ser: 0.6 mg/dL (ref 0.4–1.2)
Glucose, Bld: 87 mg/dL (ref 70–99)
Hemoglobin: 13.6 g/dL (ref 12.0–15.0)
TCO2: 20 mmol/L (ref 0–100)

## 2011-03-09 LAB — CBC
HCT: 34.3 % — ABNORMAL LOW (ref 36.0–46.0)
HCT: 38.7 % (ref 36.0–46.0)
HCT: 39.7 % (ref 36.0–46.0)
Hemoglobin: 13.3 g/dL (ref 12.0–15.0)
Hemoglobin: 13.6 g/dL (ref 12.0–15.0)
Hemoglobin: 13.8 g/dL (ref 12.0–15.0)
MCHC: 33.7 g/dL (ref 30.0–36.0)
MCHC: 34.3 g/dL (ref 30.0–36.0)
MCV: 90.8 fL (ref 78.0–100.0)
MCV: 91 fL (ref 78.0–100.0)
MCV: 91.1 fL (ref 78.0–100.0)
Platelets: 312 10*3/uL (ref 150–400)
RBC: 3.76 MIL/uL — ABNORMAL LOW (ref 3.87–5.11)
RBC: 4.37 MIL/uL (ref 3.87–5.11)
RBC: 4.38 MIL/uL (ref 3.87–5.11)
WBC: 14.7 10*3/uL — ABNORMAL HIGH (ref 4.0–10.5)
WBC: 17.5 10*3/uL — ABNORMAL HIGH (ref 4.0–10.5)
WBC: 26.3 10*3/uL — ABNORMAL HIGH (ref 4.0–10.5)

## 2011-03-09 LAB — LEGIONELLA ANTIGEN, URINE: Legionella Antigen, Urine: NEGATIVE

## 2011-03-09 LAB — ANTI-NEUTROPHIL ANTIBODY

## 2011-03-09 LAB — BASIC METABOLIC PANEL
BUN: 12 mg/dL (ref 6–23)
CO2: 23 mEq/L (ref 19–32)
GFR calc Af Amer: >60 mL/min (ref >60)
Sodium: 133 mEq/L — ABNORMAL LOW (ref 135–145)

## 2011-03-09 LAB — CULTURE, BLOOD (ROUTINE X 2)
Culture: NO GROWTH
Culture: NO GROWTH

## 2011-03-09 LAB — HIV ANTIBODY (ROUTINE TESTING W REFLEX): HIV: NONREACTIVE

## 2011-03-09 LAB — SEDIMENTATION RATE: Sed Rate: 45 mm/hr — ABNORMAL HIGH (ref 0–22)

## 2011-04-02 ENCOUNTER — Ambulatory Visit: Payer: No Typology Code available for payment source | Admitting: Physical Therapy

## 2011-04-15 NOTE — H&P (Signed)
[name], [name]            ACCOUNT NO.:  [id number]   MEDICAL RECORD NO.:  [id number]          PATIENT TYPE:  INP   LOCATION:  1444                         FACILITY:  [facility]   PHYSICIAN:  Joylene John, MD       DATE OF BIRTH:  1971/01/15   DATE OF ADMISSION:  06/28/2009  DATE OF DISCHARGE:                              HISTORY & PHYSICAL   REASON FOR ADMISSION:  Increasing difficulty breathing, cough, and  pneumonia not getting better with antibiotics.   HISTORY OF PRESENT ILLNESS:  This is a 40 year old white female who came  into the ER at 3:00 this morning for increasing cough.  The patient  stated that she has been having cough with difficulty breathing over the  last week.  She was here in the ER on July 22.  At that time, she was  given Z-Pak.  The patient tells me she completed the course of Z-Pak and  did not have any improvement in her symptoms.  She went to see her PCP.  The PCP repeated the chest x-ray and placed the patient on Avelox.  She  was supposed to take a total of 5 doses.  She had taken 3 doses up until  now.  The patient came in the ER this morning and was discharged and  told to come back if she did not feel better.  The patient came back  saying that she is not feeling better.  She denies any sick contacts,  any recent travels.  She did have a pneumonia in 2000, which required  hospitalization.   PAST MEDICAL HISTORY:  1. Asthma.  2. Degenerative joint disease.  3. Depression.  4. Gout.  5. Knee surgery.   FAMILY HISTORY:  No family history of infection, pneumonia or  rheumatologic disease in the family.   SOCIAL HISTORY:  The patient is on disability and currently living with  her mother.  She is sexually active with one partner who is a female and  currently present in the room.  The patient was last tested for HIV 2  and 1/2 years ago.  It was negative at the time.  Note, she is an active  smoker, one pack per day for 25 years, one beer every  1-2 months, no  drugs.   ALLERGIES:  DOXYCYCLINE CAUSES NAUSEA.   Chest x-ray today shows patchy perihilar infiltrates with mild hilar  enlargement consistent with atypical infection.  CT did not show any  filling defects consistent with PE.  There was diffuse patchy ground-  glass attenuation.   LABS:  Show a hemoglobin of 13.6, hematocrit 39.7, platelets 333.  White  count is high at 26.3, neutrophils 97%.  Sodium 131, potassium 4.3,  chloride 102, bicarb 19, BUN 7, creatinine 0.7, glucose 182, anion gap  of 10.  Alk-phos is elevated at 144.  AST is 49, ALT is 34, calcium is  9.8, albumin is 3.8.  EKG shows normal sinus rhythm at 109.   PHYSICAL EXAMINATION:  VITAL SIGNS:  Show a temperature of 98.5, pulse  of 115,  blood pressure ranges from 113/66.  The patient's saturation is  94% on 2 L of oxygen.  The patient is in no acute distress.  She is awake and alert and able to  talk in full sentences.  There is no icterus, pallor or JVD appreciated.  No neck bruits heard.  She does have coarse breath sounds bilaterally at the bases.  No wheezes  heard.  CARDIOVASCULAR EXAM:  She is tachycardic.  Regular rate and rhythm.  LOWER EXTREMITIES:  No edema appreciated.   ASSESSMENT/PLAN:  The plan is to admit the patient to a telemetry bed.  We will continue the ceftriaxone and azithromycin started in the  emergency room.  We will also continue the Bactrim at two double-  strength tablets t.i.d. for 48 hours until the human immunodeficiency  virus test results come back.  The patient has agreed to be tested for  human immunodeficiency virus.  If there is no improvement then to  consider an infectious disease or a pulmonary consult to help manage the  infection.  We will continue the Xanax, Seroquel and Abilify.  The  patient is not sure about the complete list of the medication and the  doses.  The partner is supposed to bring the medications in the morning  with doses.  At that time,  the medicines should be reconciled by the  nurse and then by the M.D. and meds should be started appropriately,  including her Synthroid.  We will need to follow up on the blood  cultures.      Joylene John, MD  Electronically Signed     RP/MEDQ  D:  06/28/2009  T:  06/29/2009  Job:  [id number]

## 2011-04-15 NOTE — Discharge Summary (Signed)
NAMEMarland Kitchen  Amanda, Brady NO.:  [id number]   MEDICAL RECORD NO.:  [id number]          PATIENT TYPE:  INP   LOCATION:  1444                         [facility]ACILITY:  [facility]   PHYSICIAN:  [name], M.D.   DATE OF BIRTH:  1971-07-17   DATE OF ADMISSION:  06/28/2009  DATE OF DISCHARGE:  07/01/2009                               DISCHARGE SUMMARY   PRIMARY CARE PHYSICIAN:  Dr. Knox Royalty   PAIN CLINIC SPECIALIST:  Dr. Cannon Kettle at [facility] in [place], [place].   DISCHARGE DIAGNOSES:  1. Pneumonia.  2. Pneumonitis.  3. Asthma.  4. Tobacco abuse.  5. Chronic pain syndrome.  6. Depression.  7. Anxiety.  8. Hypermagnesemia.  9. Hypophosphatasemia.  10.Hypothyroidism.   DISCHARGE MEDICATIONS:  1. Advair 500/50 1 puff b.i.d.  2. Desmopressin 0.2 mg 2 tablets p.o. q.h.s.  3. Abilify 20 mg p.o. b.i.d.  4. Lamotrigine 100 mg p.o. daily.  5. Seroquel 50 mg p.o. q.h.s.  6. Hydromet syrup 1-2 teaspoons p.o. q.4 h. p.r.n.  7. Omeprazole 40 mg p.o. daily.  8. Buspirone 15 mg p.o. b.i.d.  9. Levothyroxine 0.05 mg p.o. daily.  10.Diphenoxylate/atropine 2.5 mg p.o. t.i.d. p.r.n. diarrhea.  11.Clarinex D12 one tablet p.o. b.i.d. p.r.n. congestion.  12.Albuterol 2 puffs q.4 h. p.r.n. dyspnea.  13.MS Contin 30 mg p.o. t.i.d.  14.Norco 7.5/325 1 tablet p.o. b.i.d. p.r.n. pain.  15.Xanax 0.5 mg p.o. t.i.d. p.r.n. anxiety.  16.Xanax XR 1 mg p.o. daily.  17.Humira pen 40 mg subcutaneously every Wednesday (hold times one).  18.Avelox 400 mg p.o. daily times 6 more days.  19.Tussionex 1 teaspoon p.o. q.8 H. p.r.n. cough.  20.Nicotine patch 21 mg daily times 1 week, then 14 mg daily times 1      week, then 7 mg daily times 1 week, then discontinue.   CONSULTATIONS:  Dr. Marchelle Gearing of Pulmonology.   BRIEF ADMISSION HPI:  The patient is a 40 year old female who presented  to the hospital with non-resolution of pneumonia.  She had been treated  with two outpatient courses of antibiotics and, despite this, her  symptoms progressed.  Upon initial evaluation in the emergency  department, she was noted to be hypoxic and subsequently was referred to  the hospitalist service for further evaluation and inpatient treatment.  For full details, please see the dictated report done by Dr. Allena Katz.   PROCEDURES AND DIAGNOSTIC STUDIES:  1. Chest x-ray on June 28, 2009 showed patchy perihilar infiltrates      and mild hilar enlargement, question adenopathy.  2. CT angiogram of the chest on June 28, 2009 showed diffuse patchy      ground glass attenuation, likely reflecting pneumonia.  No      intralobular septal thickening to suggest edema.  No areas of focal      consolidation.  No evidence for a pulmonary embolus.  3. Chest x-ray on June 29, 2009 showed progression of bilateral air      space disease.  4. Chest x-ray on June 30, 2009 showed no change in aeration to the      lungs.  DISCHARGE LABORATORY VALUES:  CBC showed a white blood cell count of 13,  hemoglobin 12.8, hematocrit 38, platelets 320.  Phosphorus was 3.5.  Magnesium was 2.0.  Chemistries were normal with the exception of a  slightly low sodium at 133 and a slightly elevated glucose at 127.  Strep pneumoniae urinary antigen was negative.  ESR was 45.  HIV  antibody was nonreactive.  Blood cultures were negative to date.   PENDING LABS:  Include urinary Legionella antigen, c-ANCA, ANA,  angiotensin converting enzyme levels, and sputum cultures.   HOSPITAL COURSE:  #1.  Pneumonia/pneumonitis:  The patient was admitted  after failing outpatient therapy.  She was initially put on IV  antibiotics consisting of Rocephin and azithromycin.  Because of  concerns for a possible autoimmune process, a pulmonology consult was  requested and kindly provided by Dr. Marchelle Gearing.  She has multiple  outstanding lab values at discharge but is clinically doing better and  stable for discharge.   She has been cleared for discharge by the  pulmonologist.  She can follow up with her primary care physician or  with Dr. Marchelle Gearing for her test results.  #2.  Asthma:  The patient has not had any evidence of bronchospasm  during her hospital stay.  She was maintained on her usual home regimen.  #3.  Tobacco abuse:  The patient was counseled extensively to understand  the importance of cessation given her underlying comorbidities.  She was  provided with a nicotine patch and will be discharged with  recommendations to continue the patch and taper over time until off.  #4.  Chronic pain syndrome:  The patient is followed by Dr. Cannon Kettle at the [facility] in [place].  She should  follow up with him.  #5.  Depression/anxiety:  The patient is on multiple psychotropics which  were continued during her hospital stay.  The patient's mood was stable.  #6.  Hypermagnesemia/Hypophosphatasemia:  Resolved with repletion of  phosphorus IV.   DISPOSITION:  The patient is medically stable for discharge.  She was  able to ambulate in the hallway with her room air saturation between 94  and 96%.  She is instructed to follow up with her primary care physician  in 1-2 weeks.  She is instructed to return to the hospital for any  recurrent fever or deterioration of symptoms.   Time spent coordinating care for discharge and discharge instructions  equals 35 minutes.      [name], M.D.  Electronically Signed     CR/MEDQ  D:  07/01/2009  T:  07/01/2009  Job:  [id number]   cc:   Modena Slater, MD? Anson Fret, MD [facility] in [place]

## 2011-04-15 NOTE — Consult Note (Signed)
[name], [name]            ACCOUNT NO.:  [id number]   MEDICAL RECORD NO.:  [id number]          PATIENT TYPE:  INP   LOCATION:  1444                         FACILITY:  [facility]   PHYSICIAN:  Kalman Shan, MD   DATE OF BIRTH:  Dec 07, 1970   DATE OF CONSULTATION:  06/29/2009  DATE OF DISCHARGE:                                 CONSULTATION   REASON FOR CONSULTATION:  Pneumonia.   HISTORY OF PRESENT ILLNESS:  This is a 40 year old female who was in her  usual state of health until approximately 2 weeks ago on June 19, 2009  when she had an abscessed tooth.  After dental surgery for this, she  felt weak and tired and also complained of chills.  Several days later  on approximately June 21, 2009, she saw her primary care physician for  shortness of breath and cough and was given Avelox.  She returned on  June 28, 2009 to the ER with increased cough.  Chest x-ray showed patchy  perihilar infiltrates, and a chest CT was negative for PE but had  diffuse ground glass appearance and PCCM was consulted on 06/29/2009.  Of note, the patient prior to her dental extraction several weeks ago  did have a sick contact with a cousin who had a suspected upper  respiratory infection.  She also complains that she had 1 episode of  vomiting bile sometime between her dental extraction and when she  returned to her PCP for shortness of breath.   REVIEW OF SYSTEMS:  The patient complains of chills, shortness of breath  and cough.  All other systems were reviewed and were negative.   ALLERGIES:  THE PATIENT IS ALLERGIC TO DOXYCYCLINE.   PAST MEDICAL HISTORY:  1. Asthma.  2. Degenerative joint disease.  3. Depression.  4. Gout.  5. Interstitial cystitis.  6. The patient does have a history in 2000 of a CT with similar      appearance of diffuse infiltrates which was diagnosed as pneumonia.   SOCIAL HISTORY:  The patient lives in [place] with her mother.  She  is currently disabled.  She was  previously in [employer].  She has 1 female  sex partner.  She was checked for HIV over 2 years ago and was  reportedly negative at that time.  She does have pets in the home, a  dog, a cat, no birds and no history of IV drug use.  She is a current  smoker.  She smokes approximately 1 pack per day for the last 25 years  and occasionally uses alcohol.   FAMILY HISTORY:  No known history of cardiopulmonary disease.   HOME MEDICATIONS:  1. Advair 500/50 one puff b.i.d.  2. Desmopressin 0.4 mg p.o. q.h.s.  3. Abilify 20 mg p.o. b.i.d.  4. Lamotrigine 100 mg p.o. daily.  5. Seroquel 50 mg p.o. q.h.s.  6. Hydromet syrup 1-2 teaspoons p.o. q.4 hours.  7. Omeprazole 40 mg p.o. daily.  8. Buspirone 15 mg p.o. twice daily.  9. Levothyroxine 0.5 mg p.o. daily.  10.Topiramate 100 mg p.o. daily.  11.Clarinex D 1 tablet p.o.  b.i.d.  12.Albuterol HFA 2 puffs q.4 hours as needed.  13.MS Contin 30 mg p.o. t.i.d.  14.Norco 7.5/325 mg p.o. b.i.d. p.r.n.  15.Xanax 0.5 mg p.o. t.i.d. p.r.n.  16.Humira pen 40 mg subcu weekly.   PHYSICAL EXAMINATION:  VITAL SIGNS:  Maximum temperature 97.8, blood  pressure 116/63, heart rate 88, respiratory rate 14, O2 sats 94% on 2  liters.  GENERAL:  This is a well-developed, well-nourished female in no acute  distress.  NEURO:  She is alert and oriented x3.  Cranial nerves are grossly  intact.  Strength is 5/5 in all extremities.  Speech is clear.  HEENT:  Mucous membranes are moist.  Dentition is good.  Pupils are  equal, round, and reactive to light.  Sclerae are clear.  NECK:  Supple without lymphadenopathy.  No thyromegaly, no JVD.  CARDIOVASCULAR:  S1 and S2.  Regular rate and rhythm.  No murmurs, rubs,  or gallops.  She is normal sinus rhythm on telemetry.  EXTREMITIES:  Bilateral pedal pulses are palpable.  Warm and dry without  edema.  PULMONARY:  Breaths are even and nonlabored.  Lungs are decreased  bilaterally in the bases with faint expiratory wheeze.   She does have a  frequent cough.  GI:  The abdomen is soft, nontender, nondistended.  Bowel sounds are  active x4.  SKIN:  Without rashes or lesions.   RADIOLOGY DATA:  Chest x-ray from 06/29/2009 shows progression of right  greater than left bilateral airspace disease.  CT of the chest is  negative for PE and shows diffuse patchy ground glass infiltrates with  no focal consolidation.   LABORATORY DATA:  Basic metabolic panel:  Sodium 138, potassium 4.6, BUN  9, creatinine 0.71, glucose 154.  Phosphorus 1.5, mag 2.7.  CBC:  White  blood cells 26.8, hemoglobin 13.8, hematocrit 39.9, platelets 332.  Of  note, her white blood cell count on 06/21/2009 was 14.7.   MICRO DATA:  Blood cultures x2 are pending.  HIV was negative.  Sputum  culture is also pending.   IMPRESSION:  Atypical pneumonia.  At this time, her cough is still  worsening.  Doubt that this is Pneumocystis carinii pneumonia.  Her HIV  is negative.  She does have an asthma history but no pulmonary function  tests are available.  Differential diagnoses include aspiration  pneumonitis, viral pneumonitis, autoimmune disorder or some interstitial  lung disease including BOOP, AIP, DEP.   PLAN:  Plan at this time will be to continue her current antibiotics,  Rocephin and azithromycin.  Bactrim has been stopped, as there is low  suspicion for PCP.  Will do full autoimmune workup including ACE level,  ANCA, ANA and ESR.  We will check a urine strep and Legionella.  Bronchodilators as needed.  Followup chest x-ray.  We will check an  H1N1, although low suspicion that this would be flu, but given her  clinical status, we will begin prophylactic Tamiflu.  Will check a BNP.  If this is elevated, the patient will need a 2-D echo.  If no  improvement, the patient may ultimately need a fiberoptic bronchoscopy  for BAL cultures as well as biopsies but again would not  do this unless the patient deteriorates clinically.  Would also hold  off  on beginning any steroids unless the patient significantly deteriorates.   Thank you very much for this consult.  PCCM will continue to follow.      Dirk Dress, NP  Kalman Shan, MD  Electronically Signed    KW/MEDQ  D:  06/29/2009  T:  06/29/2009  Job:  [id number]

## 2011-04-18 NOTE — Op Note (Signed)
NAME:  [name], [name]            ACCOUNT NO.:  [id number]   MEDICAL RECORD NO.:  [id number]          PATIENT TYPE:  AMB   LOCATION:  DAY                          FACILITY:  [facility]   PHYSICIAN:  Jamison Neighbor, M.D.  DATE OF BIRTH:  10-Dec-1970   DATE OF PROCEDURE:  09/10/2004  DATE OF DISCHARGE:                                 OPERATIVE REPORT   PREOPERATIVE DIAGNOSIS:  Painful InterStim.   POSTOPERATIVE DIAGNOSIS:  Painful InterStim.   PROCEDURE:  Replacement of InterStim generator with low profile bilateral  stimulators.   SURGEON:  Dr. Logan Bores   ANESTHESIA:  General.   COMPLICATIONS:  None.   DRAINS:  None.   BRIEF HISTORY:  This 40 year old female had an outstanding response to  InterStim stimulation.  She had a bilateral stimulator placed and had  terrific sensation on both sides.  When the patient used each side  individually, she did get a reasonable response and felt that the  combination was much better than either one individually.  The patient had  developed a lot of pain at the operative site.  There are no signs of  infection.  There has been no cellulitis.  There is no sign of a hematoma  but because of her very small body habitus, she is uncomfortable from the  generator itself.  The patient was offered the possibility of having either  a single side generator or the new low profile smaller generator, and she  has elected to undergo the conversion to the smaller generator.  She  understands the risks and benefits of the procedure and gave full informed  consent.   DESCRIPTION OF PROCEDURE:  After the successful induction of general  anesthesia, the patient was placed in dorsal lithotomy position, prepped  with Betadine, and draped in the usual sterile fashion.  An incision was  made in the area of the old incision and carried down through the  subcutaneous tissues until the generator was identified.  It was elevated.  There was a modest seroma but no  hematoma and no evidence of infection.  The  connections to the wires were disconnected, and the generator was given off  to [name] from [place] to be sent away to check and see if there  was a reason for the patient to have had pain at the generator site.  The  new lead connectors were made.  The original left side to the lead was  connected to a new lead connector with a clear booty and a silk tie.  This  was attached to the generator in the lower position.  The right side of the  lead was attached with Prolene ties and a white booty to the upper position.  The patient was programmed so that these settings were identical to what she  had had with the previous device.  The device was placed as far lateral as  possible given her small body habitus and was anchored in place with a  single Vicryl stitch.  The area was inspected.  Hemostasis had been  obtained.  The incision was then closed with a  running suture of 2-0 Vicryl.  The incision was closed with staples.  The patient will  be sent home with Tylox as well as Cipro and will return to see me in follow  up in one week's time.  At that point, she will be given the new programmer  which does have the ability for four separate programs that the patient can  switch at will.      RJE/MEDQ  D:  09/10/2004  T:  09/10/2004  Job:  [id number]

## 2011-04-18 NOTE — Op Note (Signed)
NAME:  [name], [name]            ACCOUNT NO.:  [id number]   MEDICAL RECORD NO.:  [id number]          PATIENT TYPE:  AMB   LOCATION:  DAY                          FACILITY:  [facility]   PHYSICIAN:  Jamison Neighbor, M.D.  DATE OF BIRTH:  10-Nov-1971   DATE OF PROCEDURE:  08/12/2005  DATE OF DISCHARGE:                                 OPERATIVE REPORT   PREOPERATIVE DIAGNOSIS:  Malfunctioning interstem.   POSTOPERATIVE DIAGNOSIS:  Malfunctioning interstem.   PROCEDURE:  Second stage interstem replacement.   SURGEON:  Jamison Neighbor, M.D.   ANESTHESIA:  General.   COMPLICATIONS:  None.   DRAINS:  None.   BRIEF HISTORY:  This is a 40 year old female has an interstem in place.  It  has been functioning very nicely, but due to her small body habitus, the  standard InterStim has been very painful for her.  She has discomfort from  the box itself and is not tolerating it.  We note that the patient weighs  less than 100 pounds, and the standard interstem is quite large within her  body frame.  The new InterStim II system is significantly smaller,  approximately one third smaller than the Synergy device that she has in  place, and for that reason, we have elected to replace with a smaller lead.  She understands that there is a possibility we will have to do additional  revision surgery to replace the first stage, but at this point, our plan is  to replace simply the second stage, which is where she seems to have her  discomfort.  The patient understands the risks and benefits of the procedure  and gave full, informed consent.   PROCEDURE:  After successful induction of general anesthesia, patient was  placed in a prone position.  She was fully prepped and padded.  She had a 10  minute scrub and paint with Betadine.  The Viadrape was placed over the  operative field.  The previous incision was opened, and the generator was  identified and brought out of the incision.  The generator was  cut away from  the two previously placed leads.  The new InterStim II is only a unilateral  stimulator.  The patient decided that the best stimulator for her was the  right-hand side.  The right-hand side was identified by the white protective  booty and the Prolene ties.  The left side of the lead was left in place in  case in the future the patient should wish to have her leads changed.  The  protective booty was cut away from the right lead.  The lead was inspected.  It appeared to be intact.  This was then attached to the new InterStim II  stimulator.  The connection was made, and the lead was placed down within  the incision.  The incision was irrigated with antibiotic solution prior to  closure and prior to placement of the device within the cavity.  The  incision was then closed.  The  device was placed as far laterally as possible.  The capsule was closed with  a running  suture of 2-0 Vicryl, and the skin was closed with staples. The  patient tolerated the procedure well and was taken to the recovery room in  good condition.           ______________________________  Jamison Neighbor, M.D.  Electronically Signed     RJE/MEDQ  D:  08/12/2005  T:  08/12/2005  Job:  [id number]

## 2011-04-18 NOTE — Op Note (Signed)
NAME:  [name], [name]                      ACCOUNT NO.:  [id number]   MEDICAL RECORD NO.:  [id number]                   PATIENT TYPE:  AMB   LOCATION:  DAY                                  FACILITY:  [facility]   PHYSICIAN:  Jamison Neighbor, M.D.               DATE OF BIRTH:  [birth year]   DATE OF PROCEDURE:  06/18/2004  DATE OF DISCHARGE:                                 OPERATIVE REPORT   PREOPERATIVE DIAGNOSIS:  Urgency incontinence.   POSTOPERATIVE DIAGNOSIS:  Urgency incontinence.   PROCEDURE:  Second stage bilateral InterStim implantation.   SURGEON:  Jamison Neighbor, M.D.   ANESTHESIA:  General.   COMPLICATIONS:  None.   DRAINS:  None.   BRIEF HISTORY:  This 40 year old female underwent bilateral testing using  first stage InterStim implants.  The patient had an excellent response on  both sides.  We have elected to give her the Synergy bilateral stimulator.  The patient understands the risks and benefits of the procedure.  Informed  consent was obtained.   After adequate general anesthesia was obtained, the patient was placed in  the prone position, prepped with Betadine for a full 10 minutes and then  draped in the usual sterile fashion.  A Vi-Drape was placed to cover the  entire field.  The previous incision in the left upper buttocks was opened,  and a pocket was created.  The nylon sutures that had closed that were  removed.  The connection between the previously placed quadripolar leads and  the external leads was identified bilaterally.  On the left-hand side, this  was tied down with silk ties.  On the right-hand side, it was tied down with  Prolene ties.  The protective booty was cut away on each side and removed.  The lead to the outside was cut away as well.  After everything had been  disconnected, the new quadripolar leads were attached to new lead extenders  and once again the left side booty was attached with silk, and the right  side was attached with  Prolene.  The bilateral stimulator was then obtained.  The left lead was placed in the lower position; the right lead was placed in  the upper position and appropriately secured.  The pocket was then  irrigated.  Appropriate hemostasis was obtained.  The device placed within  the pocket, and impedance testing showed that both systems worked perfectly.  The incision was then closed with a running suture of 2-0 Vicryl followed by  a running double-crossed suture of 3-0 nylon.  The patient tolerated the  procedure well and was taken to the recovery room in good condition.                                               Marveen Reeks.  Logan Bores, M.D.    RJE/MEDQ  D:  06/18/2004  T:  06/18/2004  Job:  [id number]

## 2011-04-18 NOTE — Op Note (Signed)
[name], [name]            ACCOUNT NO.:  [id number]   MEDICAL RECORD NO.:  [id number]          PATIENT TYPE:  AMB   LOCATION:  DAY                          FACILITY:  [facility]   PHYSICIAN:  Jamison Neighbor, M.D.  DATE OF BIRTH:  12-25-1970   DATE OF PROCEDURE:  09/19/2005  DATE OF DISCHARGE:                                 OPERATIVE REPORT   PREOPERATIVE DIAGNOSIS:  Infected InterStim.   POSTOPERATIVE DIAGNOSIS:  Infected InterStim.   PROCEDURE:  Removal of first and second stage InterStim.   SURGEON:  Jamison Neighbor, M.D.   ANESTHESIA:  General.   COMPLICATIONS:  None.   DRAINS:  None.   BRIEF HISTORY:  This patient had an InterStim II placed.  The patient had  this placed at the site of a previous InterStim, which had to be removed  secondary to the severe pain.  The patient had a beautiful response, but  again developed a draining sinus.  We treated this with antibiotic therapy.  I have had a chance to review the literature, which indicates that if a  sinus does develop, almost certainly the device will need to be removed.  When the outflow became purulent, we decided that this patient had a  subclinical infection and needed to be removed.  The patient understands the  risks and benefits and realizes that in a few months, we can replace the  device.  She gave full informed consent.   PROCEDURE:  After successful induction of general anesthesia, the patient  was placed in the prone position, prepped with Betadine, and draped in a  usual sterile fashion.  The incision was opened.  There was not frank pus,  but it was cloudy fluid within the wound.  This was not cultured since the  patient has already been on extensive antibiotic therapy.  The pulse  generator was elevated, and with application of force, the first stage was  removed without having to make a second incision.  The patient had bilateral  first stage placed in order to determine which was the best for her,  and the  second lead  was removed as well.  The wound was irrigated.  It was left  opened.  It was packed with Betadine-soaked gauze.  The patient will be  given a prescription for Tylox.  She will return to the office on Monday.  She will take the gauze out on Sunday.  We will teach her how to begin  packing at her follow-up visit.           ______________________________  Jamison Neighbor, M.D.  Electronically Signed    RJE/MEDQ  D:  09/19/2005  T:  09/19/2005  Job:  [id number]

## 2011-04-18 NOTE — Op Note (Signed)
NAME:  [name], [name]            ACCOUNT NO.:  [id number]   MEDICAL RECORD NO.:  [id number]          PATIENT TYPE:  AMB   LOCATION:  [facility]                         FACILITY:  [facility]   PHYSICIAN:  Jamison Neighbor, M.D.  DATE OF BIRTH:  26-Dec-1970   DATE OF PROCEDURE:  03/31/2006  DATE OF DISCHARGE:                                 OPERATIVE REPORT   PREOPERATIVE DIAGNOSIS:  Urgency incontinence.   POSTOPERATIVE DIAGNOSIS:  Urgency incontinence.   PROCEDURE:  First and second stage InterStim implantation.   SURGEON:  Jamison Neighbor, M.D.   ANESTHESIA:  Local plus IV sedation.   COMPLICATIONS:  None.   DRAINS:  None.   BRIEF HISTORY:  This 40 year old female has severe urinary incontinence due  to urgency and frequency.  The patient had previously undergone InterStim  testing and had successful placement of an InterStim.  She had good results,  but then because of her very small body habitus began to have problems at  the operative site due to the large size of the generator.  When the new IPG  2 smaller generator was developed, she requested that this be placed.  The  patient underwent revision surgery, had the new IPG placed, with initially  good results, but then began to develop an infection at the site of that  second operation and had to have the device removed.  She is fully healed in  by second intent, but as long as the InterStim has not been functional she  has had a return of her symptoms.  She now requests a new InterStim be  placed.  Since we know that the patient does respond to neuromodulation and  that there is no need to prove that this will work for her, she is to  undergo simultaneous first and second stage InterStim implantation.  She  understands the risks and benefits of the procedure and gave full informed  consent.   PROCEDURE:  After successful induction of adequate IV sedation, the patient  was placed in the prone position.  Tape was used to pull the  buttocks apart  in order to expose the rectum.  She then received a full 10-minute scrub and  paint with Betadine and was covered with a Vi-Drape.  Fluoroscopy was used  to identify the approximate level of the third sacral nerve.  Without much  difficulty, a needle was passed into the third sacral foramen bilaterally.  Electrical stimulation showed an excellent effect in terms of both great toe  dorsiflexion as well as the bellows effect on the rectum with both right and  left side stimulation.  The angle of the needle appeared to be much more  favorable on the left hand side.  In order to confirm this was in the third  sacral foramen, a needle was passed one level higher, and this gave an S2  effect.  The fluoroscopy also identified this was in the third sacral  foramen.  An incision was made, and a guide wire was passed down through the  needle, which was removed.  The dilating trocar was then passed over the  guide wire, and the quadripolar lead was then passed through the dilating  system.  This was then positioned so that it gave the optimal effect on  leads 2 and 3.  The patient is so slender that actually the lead could not  be advanced any farther for fear that it would not hold, and the large #1  lead could not be positioned to give the best effect.  Clearly, the best  effect was on leads 2 and 3.  Final imaging study showed a nice medial to  lateral curve for the lead, and it was appropriately placed at S3.  The lead  was then tunneled over to a stab incision that was made in the top portion  of the right buttock.  A small pocket was developed, and the incision was  enlarged enough to the point where the generator could be placed.  The  connection was made to the IPG, which was then placed down within the  pocket, after the pocket had been irrigated.  The impedance test showed that  all appropriate connections were made, and that everything appeared to be  functioning normally.   The incision was closed with 2-0 Vicryl in the  subcutaneous tissues and nylon sutures to close the skin.  The single  puncture hole was also closed with a nylon.  The patient tolerated the  procedure well and was taken to the respiratory rate in good condition.  She  will be sent home with Lorcet Plus as well as doxycycline, and will return  to see me in followup in one week's time.           ______________________________  Jamison Neighbor, M.D.  Electronically Signed     RJE/MEDQ  D:  03/31/2006  T:  03/31/2006  Job:  [id number]

## 2011-04-18 NOTE — Op Note (Signed)
NAME:  [name], GELBER                      ACCOUNT NO.:  [id number]   MEDICAL RECORD NO.:  [id number]                   PATIENT TYPE:  AMB   LOCATION:  DAY                                  FACILITY:  [facility]   PHYSICIAN:  Jamison Neighbor, M.D.               DATE OF BIRTH:  [birth year]   DATE OF PROCEDURE:  06/04/2004  DATE OF DISCHARGE:                                 OPERATIVE REPORT   PREOPERATIVE DIAGNOSIS:  Urgency incontinence.   POSTOPERATIVE DIAGNOSIS:  Urgency incontinence.   PROCEDURE:  Bilateral first stage InterStim implantation with fluoroscopic  guidance.   SURGEON:  Jamison Neighbor, M.D.   ANESTHESIA:  Local plus sedation.   COMPLICATIONS:  None.   DRAINS:  None.   BRIEF HISTORY:  This 40 year old female has interstitial cystitis with  associated urgency, frequency and urgency incontinence.  She voided 37 times  the day prior to this procedure as based on her voiding diary.  The patient  has severe problems with uncontrolled urgency and frequency and is actually  disabled from this condition.  The patient has failed to respond to standard  therapy including anticholinergics, Elmiron, antihistamines,  antidepressants, hydrodistention and installation therapy.  The patient  desperately would like to gain control of her urinary frequency and has  agreed to try an InterStim implant.  In order to improve her chances of a  good response, she is going to have bilateral leads placed, and if there is  good response on both sides will consider placing the new Synergy stimulator  in order to stimulate the S3 nerve bilaterally.  The patient is aware of the  fact this is a staged procedure and will need to be done under local in  order to ensure that she can give [place] feedback during the placement of the  leads; the patient gave full informed consent.   PROCEDURE:  After successful induction of adequate IV sedation, the patient  was placed in the prone position.  She was  given a full 10 minute prep with  Betadine scrub and paint and she was secured to the bed with the buttocks  pulled apart in order to expose the rectal sphincter.  A Vi-Drape was placed  to cover the entire operative field.  Fluoroscopy was used to identify the  level of the third sacral foramen on each side.  Local anesthetic was  infiltrated over that area.  Search needles were placed down into the third  sacral foramen and this was ascertained with fluoroscopy. The second foramen  was also cannulated on the right hand side in order to ensure that we were  in the appropriate level, and on this side there was the normal rotation of  the foot.  With stimulation of the third sacral nerve needle on each side,  there was much less in the way of dorsiflexion of the great toe or bellows  of the rectal sphincter than  one normally sees.  However, it was clear based  on fluoroscopy that these leads were both appropriately placed at a good  angle in the third sacral foramen and it should be noted the patient felt  the stimulation right in the rectum on each side.  Given this good sensory  response as well as the good images, it was felt that these were  appropriately placed and a permanent lead should be inserted.  On each side,  a small stab incision was made, a guide wire was passed through the foramen  needle which was then removed.  The dilating trocar was then passed over the  guidewire.  The quadripolar lead was then inserted and was positioned so  that the patient felt stimulation on as many leads as possible.  It appeared  that on each side she felt stimulation on leads 1, 2 and 3, but not so much  on lead 0.  The tines were deployed and the leads on fluoroscopy to be well  positioned both on the PA and the lateral films.  The patient had a small  incision made in the lateral aspect of the left buttock.  The two  quadripolar leads were then brought over to the small incision using the   tunneling tool.  In order to avoid confusion, decision was made to attach  the left lead to the external lead with silk ties and to bring this out in  the lower of the two stab wounds and the right lead was brought out in the  upper position and that bootie was tied down with Prolene sutures.  The  protective booties and all of the wire were then placed down within that  small incision which was irrigated with antibiotic solution.  The small  incision was then closed with a Vicryl suture plus three vertical mattress  sutures of nylon.  The two small puncture holes were closed with nylon  sutures as well.  The patient tolerated the procedure and was taken to the  recovery room in good condition.  She will be sent home with Lorcet tab as  well as Keflex.  She will be asked to use one lead for 3 days and the other  lead for 4 days and will return to see me at the end of 1 week.  At that  point, we will make a decision as to whether to proceed with unilateral  stimulation, bilateral stimulation, or with removal of the leads if the test  is unsuccessful.                                               Jamison Neighbor, M.D.    RJE/MEDQ  D:  06/04/2004  T:  06/04/2004  Job:  [id number]

## 2011-04-18 NOTE — Consult Note (Signed)
[place]  Patient:    [name]                    MRN: [id number] Proc. Date: 10/11/99 Adm. Date:  [id number] Attending:  Ammie Dalton                          Consultation Report  REASON FOR CONSULTATION:  Frequency and nocturia every hour.  HISTORY OF PRESENT ILLNESS:  The patient is a 40 year old female who has a long  history of frequency and anuresis.  She has now been complaining of a nocturia every hour.  She was on DDAVP, but it has been discontinued by Dr. Theda Belfast because of low sodium.  She denies urgency or stress incontinence.  Her sodium on admission was 115, BUN 7, creatinine 0.5.  She also has a history of asthma.  PHYSICAL EXAMINATION:  ABDOMEN:  She has no CVA tenderness.  Her kidneys are not palpable.  Her bladder is not distended.  There is a tenderness in the suprapubic area.  IMPRESSION:  Her symptoms are probably secondary to neurogenic bladder.  I will need to rule out urinary tract infection.  SUGGESTIONS:  Obtain a urine culture and sensitivity.  We can put her on Ditropan 5 mg p.o. 3 times a day and observe her symptoms.  She will also need urodynamic studies.  We will follow the patient with you.  Thank you. DD:  10/11/99 TD:  10/11/99 Job: [id number]

## 2011-04-18 NOTE — H&P (Signed)
[place]  Patient:    [name]                    MRN: [id number] Adm. Date:  [id number] Attending:  Ammie Dalton                         History and Physical  CHIEF COMPLAINT:  Acute shortness of breath, respiratory distress, hypoxemia.  HISTORY OF PRESENT ILLNESS:  This is a 40 year old white female, well-known to y practice, with history of asthma, bipolar depression, hypothyroidism, reflux disease, urinary incontinence and migraines, who presented to my office as a walk-in with acute respiratory distress.  She says she woke up suddenly at 7 a.m. short of breath with chest pressure and diaphoresis.  It has continued without relief.  Upon entering my office, her pulse oximetry was 79% on room air and she had labored breathing without accessory muscle use.  She had poor air movement nd a few expiratory wheezes but no crackles, rhonchi or rales.  Two albuterol nebulizers were given with improved air movement but still significant shortness of breath.  Patient appeared dyspneic and somewhat somnolent.  This is how she looked when she was on her higher-dose medications for her psychiatric illnesses.  She  denies any recent infections, problems or anything; she actually states she has  been doing well from a psychiatric standpoint with Gypsy Balsam, M.D. and ad surgery on her knee about two weeks ago.  Since she had had the recent surgery n her knee, I was concerned about a pulmonary embolism, even though her leg did not have an apparent DVT on either side.  I obtained a spiral CT to rule out a pulmonary embolus, which it did, but it was concerning for pulmonary edema, mild cardiomegaly and alveolar infiltrates.  I brought the patient in for acute admission for respiratory distress, probably pneumonia, with question of underlying illness and asthma exacerbation.  Of note, I also gave her Rocephin and Solu-Medrol in my  office.  PAST MEDICAL HISTORY:  Hypothyroidism; asthma; depression; reflux disease; urinary incontinence (followed by Lindaann Slough, M.D.); migraines; bipolar disorder; anxiety; lower back pain; chronic knee pain, bilateral, status post multiple arthroscopies; tobacco abuse; problems with prescribed medications with abuse potential, especially pain medications.  MEDICATIONS:  (Of note, I am not sure this is an accurate account, since she did not bring her medications and she was groggy.) 1. Klonopin 4 mg p.o. q.d. 2. BuSpar 45 mg p.o. q.a.m. and 30 mg p.o. q.h.s. 3. Celexa 40 mg p.o. q.d. 4. Elavil 100 mg p.o. q.h.s. 5. Flovent 110 mcg two puffs q.12h. 6. DDAVP two puffs q.h.s. 7. Synthroid unknown dose q.d. 8. Neurontin 600 mg p.o. q.i.d. 9. Prevacid 30 mg p.o. q.d.  ALLERGIES:  No known drug allergies.  SOCIAL HISTORY:  Now living with mom.  Recently separated from her lesbian lover. No children.  Not currently working due to disability.  FAMILY HISTORY:  Unremarkable.  REVIEW OF SYSTEMS:  No fevers, chills, cough, cold symptoms, nausea, vomiting, abdominal pain, pelvic pain, abnormal periods, pregnancy or other complaints other than listed in the HPI.  PHYSICAL EXAMINATION:  VITAL SIGNS:  Temperature 100; pulse 100; pulse oximetry 79% on room air, 98% on 2 L; respirations 20 to 25 and labored, without accessory muscle use; blood pressure 100/50 and stable.  GENERAL:  Patient appears in mild distress.  HEENT:  [place]/AT.  EOMI.  PERRLA.  Clear posterior pharynx.  Normal TMs.  NECK:  Supple without adenopathy, JVD, thyromegaly or bruits.  CHEST:  A few expiratory wheezes but poor air movement as a whole.  No crackles, rhonchi or rales.  CARDIOVASCULAR:  Regular rate, without murmur, gallop or rub.  Non-displaced PMI.  ABDOMEN:  Soft, nontender, nondistended.  Bowel sounds positive.  Without hepatosplenomegaly or masses.  EXTREMITIES:  Without clubbing,  cyanosis, or edema.  Knee:  Healing arthroscopy  ______ without purulence.  Negative Homans sign.  No evidence of cord or tenderness.  No evidence of Bakers cyst.  RECTAL:  Deferred.  GU:  Deferred.  ASSESSMENT AND PLAN:  This is a 40 year old white female with multiple medical problems and polypharmacy, who presents in acute respiratory distress with asthma exacerbation and hypoxemia.  Will admit to floor and follow closely.  1. Respiratory distress:  As above, will use albuterol nebulizers q.2-4h. p.r.n.    shortness of breath and oxygen, titrated.  Will start intravenous Levaquin to    cover for potential pneumonia.  If does not improve, will consider continuing    the steroids.  At this point, I do not think she needs it.  I consulted    Onalee Hua B. Simonds, M.D., since he had seen her before, for further evaluation of    potential underlying pulmonary process other than her asthma, with pulmonary    edema and alveolar infiltrates.  Will obtain repeat chest x-rays and baseline    arterial blood gas on room air and laboratory work. 2. Shortness of breath, as above. 3. Asthma, as above. 4. Hyponatremia:  Laboratories revealed a sodium of 115.  I repeated it and it as    still 115.  I considered using 3% normal saline to bring her about, but    considering the patient looked well and was not symptomatic with her low sodium,    I just elected to bring it up with normal saline at strong doses and I question    whether it was truly that low and will recheck it in the morning.  I will stop    her DDAVP. 5. Pulmonary edema:  I will diurese her with Lasix and check an echocardiogram o    see why this was potentially caused and see if we need to get a cardiac consult. 6. Hypothyroidism:  Continue current dose of Synthroid.  I will have to check in    her chart and see what it says. 7. Psychiatric disease with bipolar and depression:  Will continue her current    medications at  the discretion of Dr. Sung Amabile.  Will also have to adjust them    because the medication doses I gave, some of the are not appropriate doses. I    think she does not remember. 8. Reflux disease:  Continue Prevacid.  9. Urinary incontinence:  I will have to talk to Dr. Brunilda Payor about other alternatives,    since she cannot use the DDAVP, including surgery. DD:  10/10/99 TD:  10/10/99 Job: [id number]

## 2011-04-21 ENCOUNTER — Ambulatory Visit: Payer: Medicare Other | Admitting: Physical Therapy

## 2011-04-22 ENCOUNTER — Ambulatory Visit: Payer: Medicare Other | Attending: Orthopaedic Surgery | Admitting: Physical Therapy

## 2011-04-22 DIAGNOSIS — M25676 Stiffness of unspecified foot, not elsewhere classified: Secondary | ICD-10-CM | POA: Insufficient documentation

## 2011-04-22 DIAGNOSIS — M6281 Muscle weakness (generalized): Secondary | ICD-10-CM | POA: Insufficient documentation

## 2011-04-22 DIAGNOSIS — M25673 Stiffness of unspecified ankle, not elsewhere classified: Secondary | ICD-10-CM | POA: Insufficient documentation

## 2011-04-22 DIAGNOSIS — R5381 Other malaise: Secondary | ICD-10-CM | POA: Insufficient documentation

## 2011-04-22 DIAGNOSIS — IMO0001 Reserved for inherently not codable concepts without codable children: Secondary | ICD-10-CM | POA: Insufficient documentation

## 2011-04-22 DIAGNOSIS — M25579 Pain in unspecified ankle and joints of unspecified foot: Secondary | ICD-10-CM | POA: Insufficient documentation

## 2011-04-24 ENCOUNTER — Ambulatory Visit: Payer: Medicare Other | Admitting: Physical Therapy

## 2011-04-29 ENCOUNTER — Ambulatory Visit: Payer: Medicare Other | Attending: Orthopedic Surgery | Admitting: Physical Therapy

## 2011-04-29 DIAGNOSIS — IMO0001 Reserved for inherently not codable concepts without codable children: Secondary | ICD-10-CM | POA: Insufficient documentation

## 2011-04-29 DIAGNOSIS — R269 Unspecified abnormalities of gait and mobility: Secondary | ICD-10-CM | POA: Insufficient documentation

## 2011-04-29 DIAGNOSIS — R5381 Other malaise: Secondary | ICD-10-CM | POA: Insufficient documentation

## 2011-04-29 DIAGNOSIS — M25569 Pain in unspecified knee: Secondary | ICD-10-CM | POA: Insufficient documentation

## 2011-04-29 DIAGNOSIS — M25669 Stiffness of unspecified knee, not elsewhere classified: Secondary | ICD-10-CM | POA: Insufficient documentation

## 2011-05-01 ENCOUNTER — Ambulatory Visit: Payer: Medicare Other | Admitting: Physical Therapy

## 2011-05-02 ENCOUNTER — Ambulatory Visit: Payer: Medicare Other | Attending: Orthopedic Surgery | Admitting: Physical Therapy

## 2011-05-02 DIAGNOSIS — IMO0001 Reserved for inherently not codable concepts without codable children: Secondary | ICD-10-CM | POA: Insufficient documentation

## 2011-05-02 DIAGNOSIS — M25669 Stiffness of unspecified knee, not elsewhere classified: Secondary | ICD-10-CM | POA: Insufficient documentation

## 2011-05-02 DIAGNOSIS — R5381 Other malaise: Secondary | ICD-10-CM | POA: Insufficient documentation

## 2011-05-02 DIAGNOSIS — M25569 Pain in unspecified knee: Secondary | ICD-10-CM | POA: Insufficient documentation

## 2011-05-02 DIAGNOSIS — R269 Unspecified abnormalities of gait and mobility: Secondary | ICD-10-CM | POA: Insufficient documentation

## 2011-05-05 ENCOUNTER — Ambulatory Visit: Payer: Medicare Other | Admitting: Physical Therapy

## 2011-05-05 DIAGNOSIS — R5381 Other malaise: Secondary | ICD-10-CM | POA: Insufficient documentation

## 2011-05-05 DIAGNOSIS — M25676 Stiffness of unspecified foot, not elsewhere classified: Secondary | ICD-10-CM | POA: Insufficient documentation

## 2011-05-05 DIAGNOSIS — M6281 Muscle weakness (generalized): Secondary | ICD-10-CM | POA: Insufficient documentation

## 2011-05-05 DIAGNOSIS — M25579 Pain in unspecified ankle and joints of unspecified foot: Secondary | ICD-10-CM | POA: Insufficient documentation

## 2011-05-05 DIAGNOSIS — IMO0001 Reserved for inherently not codable concepts without codable children: Secondary | ICD-10-CM | POA: Insufficient documentation

## 2011-05-05 DIAGNOSIS — M25673 Stiffness of unspecified ankle, not elsewhere classified: Secondary | ICD-10-CM | POA: Insufficient documentation

## 2011-05-07 ENCOUNTER — Ambulatory Visit: Payer: Medicare Other | Admitting: Physical Therapy

## 2011-05-12 ENCOUNTER — Encounter: Payer: Medicare Other | Admitting: Physical Therapy

## 2011-05-14 ENCOUNTER — Ambulatory Visit: Payer: Medicare Other | Admitting: Physical Therapy

## 2011-05-19 ENCOUNTER — Encounter: Payer: Medicare Other | Admitting: Physical Therapy

## 2011-05-20 ENCOUNTER — Ambulatory Visit: Payer: Medicare Other | Admitting: Physical Therapy

## 2011-05-22 ENCOUNTER — Ambulatory Visit: Payer: Medicare Other | Admitting: Physical Therapy

## 2011-05-26 ENCOUNTER — Encounter: Payer: Medicare Other | Admitting: Physical Therapy

## 2011-05-27 ENCOUNTER — Ambulatory Visit: Payer: Medicare Other | Admitting: Physical Therapy

## 2011-05-28 ENCOUNTER — Ambulatory Visit: Payer: Medicare Other | Admitting: Physical Therapy

## 2011-06-03 ENCOUNTER — Encounter: Payer: Medicare Other | Admitting: Physical Therapy

## 2011-06-11 ENCOUNTER — Ambulatory Visit: Payer: Medicare Other | Attending: Orthopaedic Surgery | Admitting: Physical Therapy

## 2011-06-11 DIAGNOSIS — M25673 Stiffness of unspecified ankle, not elsewhere classified: Secondary | ICD-10-CM | POA: Insufficient documentation

## 2011-06-11 DIAGNOSIS — M25676 Stiffness of unspecified foot, not elsewhere classified: Secondary | ICD-10-CM | POA: Insufficient documentation

## 2011-06-11 DIAGNOSIS — M25579 Pain in unspecified ankle and joints of unspecified foot: Secondary | ICD-10-CM | POA: Insufficient documentation

## 2011-06-11 DIAGNOSIS — IMO0001 Reserved for inherently not codable concepts without codable children: Secondary | ICD-10-CM | POA: Insufficient documentation

## 2011-06-11 DIAGNOSIS — R5381 Other malaise: Secondary | ICD-10-CM | POA: Insufficient documentation

## 2011-06-11 DIAGNOSIS — M6281 Muscle weakness (generalized): Secondary | ICD-10-CM | POA: Insufficient documentation

## 2011-06-12 ENCOUNTER — Ambulatory Visit: Payer: Medicare Other | Admitting: Physical Therapy

## 2011-06-17 ENCOUNTER — Encounter: Payer: Medicare Other | Admitting: Physical Therapy

## 2011-06-19 ENCOUNTER — Ambulatory Visit: Payer: Medicare Other | Admitting: Physical Therapy

## 2011-06-24 ENCOUNTER — Encounter: Payer: Medicare Other | Admitting: Physical Therapy

## 2011-06-26 ENCOUNTER — Ambulatory Visit: Payer: Medicare Other | Admitting: Physical Therapy

## 2011-07-01 ENCOUNTER — Ambulatory Visit: Payer: Medicare Other | Admitting: Physical Therapy

## 2011-07-03 ENCOUNTER — Encounter: Payer: Medicare Other | Admitting: Physical Therapy

## 2011-07-08 ENCOUNTER — Encounter: Payer: Medicare Other | Admitting: Physical Therapy

## 2011-07-10 ENCOUNTER — Ambulatory Visit: Payer: Medicare Other | Attending: Orthopaedic Surgery | Admitting: Physical Therapy

## 2011-07-10 DIAGNOSIS — M25676 Stiffness of unspecified foot, not elsewhere classified: Secondary | ICD-10-CM | POA: Insufficient documentation

## 2011-07-10 DIAGNOSIS — R5381 Other malaise: Secondary | ICD-10-CM | POA: Insufficient documentation

## 2011-07-10 DIAGNOSIS — IMO0001 Reserved for inherently not codable concepts without codable children: Secondary | ICD-10-CM | POA: Insufficient documentation

## 2011-07-10 DIAGNOSIS — M25673 Stiffness of unspecified ankle, not elsewhere classified: Secondary | ICD-10-CM | POA: Insufficient documentation

## 2011-07-10 DIAGNOSIS — M25579 Pain in unspecified ankle and joints of unspecified foot: Secondary | ICD-10-CM | POA: Insufficient documentation

## 2011-07-10 DIAGNOSIS — M6281 Muscle weakness (generalized): Secondary | ICD-10-CM | POA: Insufficient documentation

## 2011-09-03 ENCOUNTER — Ambulatory Visit: Payer: Medicare Other | Attending: Orthopedic Surgery | Admitting: Physical Therapy

## 2011-09-03 DIAGNOSIS — M25539 Pain in unspecified wrist: Secondary | ICD-10-CM | POA: Insufficient documentation

## 2011-09-03 DIAGNOSIS — M6281 Muscle weakness (generalized): Secondary | ICD-10-CM | POA: Insufficient documentation

## 2011-09-03 DIAGNOSIS — IMO0001 Reserved for inherently not codable concepts without codable children: Secondary | ICD-10-CM | POA: Insufficient documentation

## 2011-09-10 ENCOUNTER — Ambulatory Visit: Payer: Medicare Other | Admitting: Physical Therapy

## 2011-09-15 ENCOUNTER — Ambulatory Visit: Payer: Medicare Other | Admitting: Physical Therapy

## 2011-09-18 ENCOUNTER — Ambulatory Visit: Payer: Medicare Other | Admitting: Physical Therapy

## 2011-09-22 ENCOUNTER — Ambulatory Visit: Payer: Medicare Other | Admitting: Physical Therapy

## 2011-09-24 ENCOUNTER — Encounter: Payer: Medicare Other | Admitting: Physical Therapy

## 2011-09-29 ENCOUNTER — Ambulatory Visit: Payer: Medicare Other | Admitting: Physical Therapy

## 2011-10-06 ENCOUNTER — Ambulatory Visit: Payer: Medicare Other | Attending: Orthopedic Surgery | Admitting: Physical Therapy

## 2011-10-06 DIAGNOSIS — M25539 Pain in unspecified wrist: Secondary | ICD-10-CM | POA: Insufficient documentation

## 2011-10-06 DIAGNOSIS — M6281 Muscle weakness (generalized): Secondary | ICD-10-CM | POA: Insufficient documentation

## 2011-10-06 DIAGNOSIS — IMO0001 Reserved for inherently not codable concepts without codable children: Secondary | ICD-10-CM | POA: Insufficient documentation

## 2011-10-08 ENCOUNTER — Ambulatory Visit: Payer: Medicare Other | Admitting: Physical Therapy

## 2011-10-22 ENCOUNTER — Other Ambulatory Visit: Payer: Self-pay | Admitting: Family Medicine

## 2011-10-22 DIAGNOSIS — Z1231 Encounter for screening mammogram for malignant neoplasm of breast: Secondary | ICD-10-CM

## 2011-12-01 ENCOUNTER — Ambulatory Visit
Admission: RE | Admit: 2011-12-01 | Discharge: 2011-12-01 | Disposition: A | Discharge status: Home / Self Care | Payer: Medicare Other | Source: Ambulatory Visit | Attending: Family Medicine | Admitting: Family Medicine

## 2011-12-01 DIAGNOSIS — Z1231 Encounter for screening mammogram for malignant neoplasm of breast: Secondary | ICD-10-CM

## 2011-12-20 ENCOUNTER — Emergency Department (HOSPITAL_COMMUNITY)
Admission: EM | Admit: 2011-12-20 | Discharge: 2011-12-20 | Disposition: A | Discharge status: Home / Self Care | Payer: Medicare Other | Attending: Emergency Medicine | Admitting: Emergency Medicine

## 2011-12-20 ENCOUNTER — Encounter (HOSPITAL_COMMUNITY): Payer: Self-pay | Admitting: *Deleted

## 2011-12-20 ENCOUNTER — Emergency Department (HOSPITAL_COMMUNITY): Payer: Medicare Other

## 2011-12-20 ENCOUNTER — Other Ambulatory Visit: Payer: Self-pay

## 2011-12-20 DIAGNOSIS — R209 Unspecified disturbances of skin sensation: Secondary | ICD-10-CM | POA: Insufficient documentation

## 2011-12-20 DIAGNOSIS — M329 Systemic lupus erythematosus, unspecified: Secondary | ICD-10-CM | POA: Insufficient documentation

## 2011-12-20 DIAGNOSIS — R0789 Other chest pain: Secondary | ICD-10-CM | POA: Insufficient documentation

## 2011-12-20 DIAGNOSIS — Z79899 Other long term (current) drug therapy: Secondary | ICD-10-CM | POA: Insufficient documentation

## 2011-12-20 LAB — POCT I-STAT, CHEM 8
BUN: 8 mg/dL (ref 6–23)
Calcium, Ion: 1.26 mmol/L (ref 1.12–1.32)
Creatinine, Ser: 0.7 mg/dL (ref 0.50–1.10)
Glucose, Bld: 99 mg/dL (ref 70–99)
TCO2: 22 mmol/L (ref 0–100)

## 2011-12-20 LAB — CBC
Hemoglobin: 14.1 g/dL (ref 12.0–15.0)
MCH: 31.4 pg (ref 26.0–34.0)
MCV: 89.1 fL (ref 78.0–100.0)
RBC: 4.49 MIL/uL (ref 3.87–5.11)

## 2011-12-20 MED ORDER — IBUPROFEN 200 MG PO TABS
600.0000 mg | ORAL_TABLET | Freq: Once | ORAL | Status: AC
  Administered 2011-12-20: 600 mg via ORAL
  Filled 2011-12-20: qty 3

## 2011-12-20 MED ORDER — ASPIRIN 81 MG PO CHEW
324.0000 mg | CHEWABLE_TABLET | Freq: Once | ORAL | Status: AC
  Administered 2011-12-20: 324 mg via ORAL
  Filled 2011-12-20: qty 4

## 2011-12-20 NOTE — ED Notes (Signed)
Dr.Rancour given copy of ecg. 

## 2011-12-20 NOTE — ED Provider Notes (Signed)
History     CSN: [id number]  Arrival date & time 12/20/11  2725   First MD Initiated Contact with Patient 12/20/11 [phone]      Chief Complaint  Patient presents with  . Chest Pain    (Consider location/radiation/quality/duration/timing/severity/associated sxs/prior treatment) HPI  Patient presents to emergency department complaining of chest discomfort and right arm tingling. Patient states that she woke this morning feeling a tingling sensation in her right arm and then noticed a "subtle chest discomfort." Patient states she's been having a mild aching in the left side of her chest onset of pain this morning. Patient denies any associated nausea, vomiting, diaphoresis, shortness of breath, or abdominal pain. Patient denies aggravating or alleviating factors. Patient denies similar symptoms in the past. Patient states she quit smoking a week ago. Patient denies any early family history of heart disease or heart attack. Patient denies any personal history of heart disease or lung disease. Patient states she has "knee and back problems" for which she takes medication and history of lupus. Patient denies any extremity weakness, slurred speech, facial droop, dizziness, headache, or difficulty ambulating. Patient denies history of PE or DVT he denies lower extremity pain or swelling. She has not taken anything for pain prior to arrival and denies daily aspirin use.  No past medical history on file.  No past surgical history on file.  No family history on file.  History  Substance Use Topics  . Smoking status: Not on file  . Smokeless tobacco: Not on file  . Alcohol Use: Not on file    OB History    No data available      Review of Systems  All other systems reviewed and are negative.    Allergies  Doxycycline  Home Medications   Current Outpatient Rx  Name Route Sig Dispense Refill  . ALPRAZOLAM 0.5 MG PO TABS Oral Take 0.5 mg by mouth 4 (four) times daily as needed. For  anxiety    . ARIPIPRAZOLE 20 MG PO TABS Oral Take 20 mg by mouth daily.    . DESMOPRESSIN ACETATE 0.2 MG PO TABS Oral Take 0.2 mg by mouth daily.    . DESVENLAFAXINE SUCCINATE ER 100 MG PO TB24 Oral Take 100 mg by mouth daily.    Marland Kitchen ESOMEPRAZOLE MAGNESIUM 40 MG PO CPDR Oral Take 40 mg by mouth daily before breakfast.    . HYDROCODONE-ACETAMINOPHEN 10-325 MG PO TABS Oral Take 1 tablet by mouth 3 (three) times daily.    Marland Kitchen LEVOTHYROXINE SODIUM 50 MCG PO TABS Oral Take 50 mcg by mouth daily.    . MORPHINE SULFATE ER 30 MG PO TB12 Oral Take 30 mg by mouth 3 (three) times daily.    . TOPIRAMATE 100 MG PO TABS Oral Take 100 mg by mouth daily.      BP 105/55  Pulse 65  Temp(Src) 98.2 F (36.8 C) (Oral)  Resp 20  SpO2 99%  Physical Exam  Nursing note and vitals reviewed. Constitutional: She is oriented to person, place, and time. She appears well-developed and well-nourished. No distress.  HENT:  Head: Normocephalic and atraumatic.  Eyes: Conjunctivae are normal.  Neck: Normal range of motion. Neck supple.  Cardiovascular: Normal rate, regular rhythm, normal heart sounds and intact distal pulses.  Exam reveals no gallop and no friction rub.   No murmur heard. Pulmonary/Chest: Effort normal and breath sounds normal. No respiratory distress. She has no wheezes. She has no rales. She exhibits no tenderness.  Abdominal: Bowel  sounds are normal. She exhibits no distension and no mass. There is no tenderness. There is no rebound and no guarding.  Musculoskeletal: Normal range of motion. She exhibits no edema and no tenderness.  Neurological: She is alert and oriented to person, place, and time. She has normal reflexes. No cranial nerve deficit.  Skin: Skin is warm and dry. No rash noted. She is not diaphoretic. No erythema.  Psychiatric: She has a normal mood and affect.    ED Course  Procedures (including critical care time)  PO ASA   Date: 12/20/2011  Rate: 62  Rhythm: normal sinus  rhythm  QRS Axis: normal  Intervals: normal  ST/T Wave abnormalities: normal  Conduction Disutrbances:none  Narrative Interpretation: no significant changes from June 28, 2009  Old EKG Reviewed: unchanged     Labs Reviewed  CBC  POCT I-STAT, CHEM 8  POCT I-STAT TROPONIN I  I-STAT, CHEM 8  I-STAT TROPONIN I   Dg Chest 2 View  12/20/2011  *RADIOLOGY REPORT*  Clinical Data: Left chest pain  CHEST - 2 VIEW  Comparison: 04/17/2010  Findings: Lungs are essentially clear. No pleural effusion or pneumothorax.  Cardiomediastinal silhouette is within normal limits.  Mild degenerative changes of the visualized thoracolumbar spine.  IMPRESSION: No evidence of acute cardiopulmonary disease.  Original Report Authenticated By: Charline Bills, M.D.     1. Chest discomfort       MDM  Patient is very low risk for acute coronary syndrome and is also low risk for pulmonary embolism as well as being PERC negative. Patient had no significant findings on her EKG, chest x-ray, or labs. Patient has full range of motion of bilateral upper extremities which are neurovascularly intact has no neuro focal findings. Patient ambulating without difficulty.        Lenon Oms [place], [place] 12/20/11 [phone]

## 2011-12-20 NOTE — ED Provider Notes (Signed)
Medical screening examination/treatment/procedure(s) were conducted as a shared visit with non-physician practitioner(s) and myself.  I personally evaluated the patient during the encounter  R arm tingling with "chest discomfort"  Constant. No SOB, cough, nausea, fever, diaphoresis. EKG nonischemic, PERC neg.  TIMI 0  Glynn Octave, MD 12/20/11 1750

## 2011-12-20 NOTE — ED Notes (Signed)
Pt. Woke up with a discomfortt in her chest and a tingling and heaviness into her rt. Arm. Pt. Is in NAD, skin is w/d, resp. E/u. Pt. Denies any n/v/ or sob

## 2011-12-21 ENCOUNTER — Encounter (HOSPITAL_COMMUNITY): Payer: Self-pay | Admitting: Emergency Medicine

## 2011-12-21 ENCOUNTER — Emergency Department (HOSPITAL_COMMUNITY)
Admission: EM | Admit: 2011-12-21 | Discharge: 2011-12-21 | Disposition: A | Discharge status: Home / Self Care | Payer: Medicare Other | Attending: Emergency Medicine | Admitting: Emergency Medicine

## 2011-12-21 ENCOUNTER — Other Ambulatory Visit: Payer: Self-pay

## 2011-12-21 DIAGNOSIS — N3010 Interstitial cystitis (chronic) without hematuria: Secondary | ICD-10-CM | POA: Insufficient documentation

## 2011-12-21 DIAGNOSIS — R209 Unspecified disturbances of skin sensation: Secondary | ICD-10-CM | POA: Insufficient documentation

## 2011-12-21 DIAGNOSIS — R202 Paresthesia of skin: Secondary | ICD-10-CM

## 2011-12-21 DIAGNOSIS — R6884 Jaw pain: Secondary | ICD-10-CM | POA: Insufficient documentation

## 2011-12-21 HISTORY — DX: Interstitial cystitis (chronic) without hematuria: N30.10

## 2011-12-21 NOTE — ED Notes (Signed)
Pt resting quietly at the time. Denies pain. Vital signs stable. No signs of distress noted. Friend at bedside.

## 2011-12-21 NOTE — ED Provider Notes (Signed)
History     CSN: [id number]  Arrival date & time 12/21/11  1527   First MD Initiated Contact with Patient 12/21/11 1752      Chief Complaint  Patient presents with  . Jaw Pain    (Consider location/radiation/quality/duration/timing/severity/associated sxs/prior treatment) HPI Comments: Patient with history of cardiac workup yesterday in the emergency department, discharged after normal workup. Patient presents today with the complaints of left facial numbness and left arm numbness. Patient states this started approximately 10 AM. It has improved slightly since onset. Nothing makes the symptoms better or worse. Patient is not taking any medications prior to arrival. Patient does not report any facial droop, trouble with speech. No involvement of extremities. No trouble walking. Patient has not had any recent fevers, nausea or vomiting. No headache, loss of consciousness.  The history is provided by the patient and medical records.    Past Medical History  Diagnosis Date  . Interstitial cystitis     History reviewed. No pertinent past surgical history.  No family history on file.  History  Substance Use Topics  . Smoking status: Former Games developer  . Smokeless tobacco: Not on file  . Alcohol Use: No    OB History    Grav Para Term Preterm Abortions TAB SAB Ect Mult Living                  Review of Systems  Constitutional: Negative for fever and chills.  HENT: Negative for sore throat, facial swelling, rhinorrhea, neck pain and neck stiffness.   Eyes: Negative for discharge.  Respiratory: Negative for shortness of breath.   Cardiovascular: Negative for chest pain.  Gastrointestinal: Negative for nausea, vomiting, abdominal pain, diarrhea and constipation.  Genitourinary: Negative for dysuria.  Musculoskeletal: Negative for myalgias.  Skin: Negative for rash.  Neurological: Positive for numbness. Negative for syncope, facial asymmetry, speech difficulty, weakness,  light-headedness and headaches.  Psychiatric/Behavioral: Negative for confusion.    Allergies  Doxycycline  Home Medications   Current Outpatient Rx  Name Route Sig Dispense Refill  . ALPRAZOLAM 0.5 MG PO TABS Oral Take 0.5 mg by mouth 4 (four) times daily as needed. For anxiety    . ARIPIPRAZOLE 20 MG PO TABS Oral Take 20 mg by mouth 2 (two) times daily.     . DESMOPRESSIN ACETATE 0.2 MG PO TABS Oral Take 0.4 mg by mouth at bedtime.     . DESVENLAFAXINE SUCCINATE ER 100 MG PO TB24 Oral Take 100 mg by mouth daily.    Marland Kitchen ESOMEPRAZOLE MAGNESIUM 40 MG PO CPDR Oral Take 40 mg by mouth daily before breakfast.    . HYDROCODONE-ACETAMINOPHEN 10-325 MG PO TABS Oral Take 1 tablet by mouth 3 (three) times daily.    Marland Kitchen LEVOTHYROXINE SODIUM 50 MCG PO TABS Oral Take 50 mcg by mouth daily.    . MORPHINE SULFATE ER 30 MG PO TB12 Oral Take 30 mg by mouth 3 (three) times daily.    . TOPIRAMATE 100 MG PO TABS Oral Take 100 mg by mouth daily.      BP 119/71  Pulse 79  Temp(Src) 98.5 F (36.9 C) (Oral)  Resp 16  SpO2 96%  LMP 12/17/2011  Physical Exam  Nursing note and vitals reviewed. Constitutional: She is oriented to person, place, and time. She appears well-developed and well-nourished.  HENT:  Head: Normocephalic and atraumatic.  Eyes: Conjunctivae and EOM are normal. Pupils are equal, round, and reactive to light. Right eye exhibits no discharge. Left eye exhibits  no discharge.  Neck: Normal range of motion. Neck supple.  Cardiovascular: Normal rate, regular rhythm and normal heart sounds.   Pulmonary/Chest: Effort normal and breath sounds normal. No respiratory distress. She has no wheezes.  Abdominal: Soft. There is no tenderness.  Musculoskeletal: Normal range of motion. She exhibits no edema and no tenderness.  Neurological: She is alert and oriented to person, place, and time. She has normal strength. She displays normal reflexes. A sensory deficit is present. No cranial nerve deficit.  She exhibits normal muscle tone. Coordination normal. GCS eye subscore is 4. GCS verbal subscore is 5. GCS motor subscore is 6.       Patient states decreased sensation on the left face extending from the chin to approximately the level of the eye and extending posteriorly towards the ear. Patient has normal sensation in her left forehead. Patient also states abnormal sensation on left occiput.  Skin: Skin is warm and dry. No rash noted.  Psychiatric: She has a normal mood and affect.    ED Course  Procedures (including critical care time)  Labs Reviewed - No data to display Dg Chest 2 View  12/20/2011  *RADIOLOGY REPORT*  Clinical Data: Left chest pain  CHEST - 2 VIEW  Comparison: 04/17/2010  Findings: Lungs are essentially clear. No pleural effusion or pneumothorax.  Cardiomediastinal silhouette is within normal limits.  Mild degenerative changes of the visualized thoracolumbar spine.  IMPRESSION: No evidence of acute cardiopulmonary disease.  Original Report Authenticated By: Charline Bills, M.D.     1. Paresthesias     Date: 12/21/2011  Rate: 74  Rhythm: normal sinus rhythm  QRS Axis: normal  Intervals: normal  ST/T Wave abnormalities: normal  Conduction Disutrbances:none  Narrative Interpretation:   Old EKG Reviewed: unchanged  Patient was seen and examined. Patient was discussed with Dr. Alto Denver who saw the patient. EKG was checked and is unchanged. Patient reassured that findings yesterday were negative. Reassured patient that she does not have any signs of stroke or other concerning etiology at this point. Patient urged to return with worsening symptoms, weakness in extremities or face, slurred speech, loss of consciousness or confusion. She verbalizes understanding and agrees with plan. Patient urged to follow up with her primary care physician in the next week given her recent episode of chest pain and facial numbness.   MDM  Facial numbness/paresthesia. Numbness is not in a  definite cranial nerve distribution. She has some involvement of occiput. Numbness spares the forehead. Doubt Bell's palsy. There is no paralysis. The remainder of the patient's neurological exam is normal. Doubt stroke or TIA. No headache. EKG is unchanged and normal. Patient appears well and is stable for discharge to home.        Eustace Moore [place], [place] 12/21/11 2326

## 2011-12-21 NOTE — ED Notes (Signed)
Pt presents to department for evaluation of L sided jaw pain and L sided tongue numbness. Onset this morning @ 08:00 while at home. Pt states she was seen here yesterday for chest pain and work up was negative. Denies pain at the time. Pt ambulatory to exam room. Respirations unlabored. Lung sounds clear and equal bilaterally. Pt conscious alert and oriented x4. No signs of distress noted.

## 2011-12-21 NOTE — ED Notes (Signed)
C/o L jaw pain that started at 8am and lasted approx 2 hours.  Reports numbness to L side of tongue that started around 10am.  Pt states she is unsure about time but states when she got up this morning it was normal.  No neuro deficits noted on triage exam.

## 2011-12-21 NOTE — ED Notes (Signed)
Pt states that she was in on 12/20/2011 for chest pain - started experiencing jaw pain today.

## 2011-12-21 NOTE — ED Notes (Signed)
Pt stated understanding of discharge instruction 

## 2011-12-22 NOTE — ED Provider Notes (Signed)
Medical screening examination/treatment/procedure(s) were conducted as a shared visit with non-physician practitioner(s) and myself.  I personally evaluated the patient during the encounter  Cyndra Numbers, MD 12/22/11 [phone]

## 2012-02-15 ENCOUNTER — Emergency Department (HOSPITAL_COMMUNITY)
Admission: EM | Admit: 2012-02-15 | Discharge: 2012-02-15 | Disposition: A | Discharge status: Home / Self Care | Payer: Medicare Other | Attending: Emergency Medicine | Admitting: Emergency Medicine

## 2012-02-15 ENCOUNTER — Encounter (HOSPITAL_COMMUNITY): Payer: Self-pay | Admitting: Emergency Medicine

## 2012-02-15 DIAGNOSIS — Z79899 Other long term (current) drug therapy: Secondary | ICD-10-CM | POA: Insufficient documentation

## 2012-02-15 DIAGNOSIS — L299 Pruritus, unspecified: Secondary | ICD-10-CM | POA: Insufficient documentation

## 2012-02-15 DIAGNOSIS — R220 Localized swelling, mass and lump, head: Secondary | ICD-10-CM | POA: Insufficient documentation

## 2012-02-15 DIAGNOSIS — X58XXXA Exposure to other specified factors, initial encounter: Secondary | ICD-10-CM | POA: Insufficient documentation

## 2012-02-15 DIAGNOSIS — T783XXA Angioneurotic edema, initial encounter: Secondary | ICD-10-CM | POA: Insufficient documentation

## 2012-02-15 DIAGNOSIS — T7840XA Allergy, unspecified, initial encounter: Secondary | ICD-10-CM

## 2012-02-15 HISTORY — DX: Bipolar disorder, unspecified: F31.9

## 2012-02-15 HISTORY — DX: Hypothyroidism, unspecified: E03.9

## 2012-02-15 HISTORY — DX: Gastro-esophageal reflux disease without esophagitis: K21.9

## 2012-02-15 LAB — POCT I-STAT, CHEM 8
BUN: 6 mg/dL (ref 6–23)
Calcium, Ion: 1.25 mmol/L (ref 1.12–1.32)
Chloride: 107 mEq/L (ref 96–112)
Glucose, Bld: 112 mg/dL — ABNORMAL HIGH (ref 70–99)

## 2012-02-15 MED ORDER — OXYCODONE-ACETAMINOPHEN 5-325 MG PO TABS
ORAL_TABLET | ORAL | Status: AC
  Administered 2012-02-15: 1
  Filled 2012-02-15: qty 1

## 2012-02-15 MED ORDER — HYDROXYZINE HCL 50 MG/ML IM SOLN
50.0000 mg | Freq: Once | INTRAMUSCULAR | Status: AC
  Administered 2012-02-15: 50 mg via INTRAMUSCULAR
  Filled 2012-02-15: qty 1

## 2012-02-15 MED ORDER — HYDROXYZINE HCL 25 MG PO TABS: 25.0000 mg | ORAL_TABLET | Freq: Four times a day (QID) | ORAL | Status: AC

## 2012-02-15 MED ORDER — PREDNISONE 50 MG PO TABS: 50.0000 mg | ORAL_TABLET | Freq: Every day | ORAL | Status: AC

## 2012-02-15 MED ORDER — FAMOTIDINE 20 MG PO TABS: 20.0000 mg | ORAL_TABLET | Freq: Two times a day (BID) | ORAL | Status: AC

## 2012-02-15 MED ORDER — FAMOTIDINE IN NACL 20-0.9 MG/50ML-% IV SOLN
20.0000 mg | Freq: Once | INTRAVENOUS | Status: AC
  Administered 2012-02-15: 20 mg via INTRAVENOUS
  Filled 2012-02-15: qty 50

## 2012-02-15 MED ORDER — METHYLPREDNISOLONE SODIUM SUCC 125 MG IJ SOLR
125.0000 mg | Freq: Once | INTRAMUSCULAR | Status: AC
  Administered 2012-02-15: 125 mg via INTRAVENOUS
  Filled 2012-02-15: qty 2

## 2012-02-15 NOTE — Discharge Instructions (Signed)
Allergic Reaction Allergic reactions can be caused by anything your body is sensitive to. Your body may be sensitive to food, medicines, molds, pollens, cockroaches, dust mites, pets, insect stings, and other things around you. An allergic reaction may cause puffiness (swelling), itching, sneezing, coughing, or problems breathing.   Allergies cannot be cured, but they can be controlled with medicine. Some allergies happen only at certain times of the year. Try to stay away from what causes your reaction if possible. Sometimes, it is hard to tell what causes your reaction. HOME CARE If you have a rash or red patches (hives) on your skin:  Take medicines as told by your doctor.   Do not drive or drink alcohol after taking medicines. They can make you sleepy.   Put cold cloths on your skin. Take baths in cool water. This will help your itching. Do not take hot baths or showers. Heat will make the itching worse.   If your allergies get worse, your doctor might give you other medicines. Talk to your doctor if problems continue.  GET HELP RIGHT AWAY IF:    You have trouble breathing.   You have a tight feeling in your chest or throat.   Your mouth gets puffy (swollen).   You have red, itchy patches on your skin (hives) that get worse.   You have itching all over your body.  MAKE SURE YOU:    Understand these instructions.   Will watch your condition.   Will get help right away if you are not doing well or get worse.  Document Released: 11/05/2009 Document Revised: 11/06/2011 Document Reviewed: 11/05/2009 [employer] Patient Information 2012 [employer]. 

## 2012-02-15 NOTE — ED Provider Notes (Signed)
History     CSN: [id number]  Arrival date & time 02/15/12  0806   First MD Initiated Contact with Patient 02/15/12 0825      Chief Complaint  Patient presents with  . Allergic Reaction    HPI Pt started with an allergic reaction yesterday.  She noticed some itching of the skin and took a benadryl.  Shortly after that she developed itching and hives all over.  Pt then took a claritin and it got worse.  Pt has been taking pain medications percocet and dilaudid for knee surgery and had not had a reaction before this.  Patient states that her face is swollen and itchy. She feels like there may be a lump in her throat but she's not having any difficulty swallowing or breathing.   Past Medical History  Diagnosis Date  . Interstitial cystitis   . Bipolar 1 disorder   . Acid reflux   . Hypothyroidism     No past surgical history on file.  No family history on file.  History  Substance Use Topics  . Smoking status: Former Games developer  . Smokeless tobacco: Not on file  . Alcohol Use: No    OB History    Grav Para Term Preterm Abortions TAB SAB Ect Mult Living                  Review of Systems  All other systems reviewed and are negative.    Allergies  Doxycycline  Home Medications   Current Outpatient Rx  Name Route Sig Dispense Refill  . ALPRAZOLAM 0.5 MG PO TABS Oral Take 0.5 mg by mouth 4 (four) times daily as needed. For anxiety    . ARIPIPRAZOLE 20 MG PO TABS Oral Take 20 mg by mouth 2 (two) times daily.     Marland Kitchen CETIRIZINE HCL 10 MG PO TABS Oral Take 10 mg by mouth daily as needed. For itching    . DESMOPRESSIN ACETATE 0.2 MG PO TABS Oral Take 0.4 mg by mouth at bedtime.     . DESVENLAFAXINE SUCCINATE ER 100 MG PO TB24 Oral Take 100 mg by mouth daily.    Marland Kitchen DIPHENHYDRAMINE HCL 25 MG PO TABS Oral Take 50 mg by mouth every 6 (six) hours as needed. For itching    . ENOXAPARIN SODIUM 40 MG/0.4ML [place] SOLN Subcutaneous Inject 40 mg into the skin daily.    Marland Kitchen ESOMEPRAZOLE  MAGNESIUM 40 MG PO CPDR Oral Take 40 mg by mouth daily before breakfast.    . FERROUS SULFATE 325 (65 FE) MG PO TABS Oral Take 325 mg by mouth daily with breakfast.    . HYDROMORPHONE HCL 2 MG PO TABS Oral Take 2-4 mg by mouth every 4 (four) hours as needed. For pain    . LEVOTHYROXINE SODIUM 50 MCG PO TABS Oral Take 50 mcg by mouth daily.    . OXYCODONE-ACETAMINOPHEN 5-325 MG PO TABS Oral Take 1-2 tablets by mouth every 4 (four) hours as needed. For pain    . TOPIRAMATE 100 MG PO TABS Oral Take 100 mg by mouth daily.      BP 133/80  Pulse 108  Temp(Src) 97.7 F (36.5 C) (Oral)  Resp 20  SpO2 98%  Physical Exam  Nursing note and vitals reviewed. Constitutional: She appears well-developed and well-nourished. No distress.  HENT:  Head: Normocephalic and atraumatic.  Right Ear: External ear normal.  Left Ear: External ear normal.  Mouth/Throat: No uvula swelling. No oropharyngeal exudate or posterior oropharyngeal  edema.  Eyes: Conjunctivae are normal. Right eye exhibits no discharge. Left eye exhibits no discharge. No scleral icterus.  Neck: Neck supple. No tracheal deviation present.  Cardiovascular: Normal rate, regular rhythm and intact distal pulses.   Pulmonary/Chest: Effort normal and breath sounds normal. No stridor. No respiratory distress. She has no wheezes. She has no rales.  Abdominal: Soft. Bowel sounds are normal. She exhibits no distension. There is no tenderness. There is no rebound and no guarding.  Musculoskeletal: She exhibits no edema and no tenderness.  Neurological: She is alert. She has normal strength. No sensory deficit. Cranial nerve deficit:  no gross defecits noted. She exhibits normal muscle tone. She displays no seizure activity. Coordination normal.  Skin: Skin is warm and dry. Rash noted. Rash is urticarial.       Periorbital edema with mild erythema, angioedema of the left upper lip,  Psychiatric: She has a normal mood and affect.    ED Course    Procedures (including critical care time)  Medications  diphenhydrAMINE (BENADRYL) 25 MG tablet (not administered)  oxyCODONE-acetaminophen (PERCOCET) 5-325 MG per tablet (not administered)  HYDROmorphone (DILAUDID) 2 MG tablet (not administered)  cetirizine (ZYRTEC) 10 MG tablet (not administered)  ferrous sulfate 325 (65 FE) MG tablet (not administered)  enoxaparin (LOVENOX) 40 MG/0.4ML injection (not administered)  hydrOXYzine (ATARAX/VISTARIL) 25 MG tablet (not administered)  predniSONE (DELTASONE) 50 MG tablet (not administered)  famotidine (PEPCID) 20 MG tablet (not administered)  hydrOXYzine (VISTARIL) injection 50 mg (50 mg Intramuscular Given 02/15/12 0908)  famotidine (PEPCID) IVPB 20 mg (20 mg Intravenous Given 02/15/12 0900)  methylPREDNISolone sodium succinate (SOLU-MEDROL) 125 mg/2 mL injection 125 mg (125 mg Intravenous Given 02/15/12 0856)    Labs Reviewed  POCT I-STAT, CHEM 8 - Abnormal; Notable for the following:    Potassium 3.4 (*)    Glucose, Bld 112 (*)    Hemoglobin 16.0 (*)    HCT 47.0 (*)    All other components within normal limits   No results found.   1. Allergic reaction       MDM  The patient appears to have an allergic reaction to some type of medication.  There does not appear to be evidence of anaphylaxis or any respiratory symptoms. It is unclear at this time as to the cause. Overall I doubt that it was an antihistamine, however it is possible that she could be allergic to some other substance in the formulation. Patient did tolerate Vistaril here in the emergency department. I will discharge her home with hydroxyzine, prednisone, and Pepcid.        Celene Kras, MD 02/15/12 [phone]

## 2012-02-15 NOTE — ED Notes (Signed)
Pt is resting and hives look better and swelling in face looks better .  VSS

## 2012-02-15 NOTE — ED Notes (Signed)
Pt. Stated, I took some Benadryl yesterday and then took some Claritin,  I'm not sure what I'm allergic to. My face stated swelling, and I have a lump in my throat.

## 2012-05-09 ENCOUNTER — Emergency Department (INDEPENDENT_AMBULATORY_CARE_PROVIDER_SITE_OTHER): Payer: Medicaid Other

## 2012-05-09 ENCOUNTER — Emergency Department (INDEPENDENT_AMBULATORY_CARE_PROVIDER_SITE_OTHER)
Admission: EM | Admit: 2012-05-09 | Discharge: 2012-05-09 | Disposition: A | Discharge status: Home / Self Care | Payer: Medicare Other | Source: Home / Self Care | Attending: Emergency Medicine | Admitting: Emergency Medicine

## 2012-05-09 ENCOUNTER — Encounter (HOSPITAL_COMMUNITY): Payer: Self-pay | Admitting: *Deleted

## 2012-05-09 DIAGNOSIS — M19049 Primary osteoarthritis, unspecified hand: Secondary | ICD-10-CM

## 2012-05-09 DIAGNOSIS — M19041 Primary osteoarthritis, right hand: Secondary | ICD-10-CM

## 2012-05-09 HISTORY — DX: Psoriasis, unspecified: L40.9

## 2012-05-09 HISTORY — DX: Hypothyroidism, unspecified: E03.9

## 2012-05-09 HISTORY — DX: Reserved for concepts with insufficient information to code with codable children: IMO0002

## 2012-05-09 MED ORDER — MELOXICAM 15 MG PO TABS: 15.0000 mg | ORAL_TABLET | Freq: Every day | ORAL | Status: AC

## 2012-05-09 NOTE — ED Notes (Signed)
Pt is here with complaints of right hand/thumb pain with onset approx 10 months ago.  Pt reports pain is getting worse, and increases with movement of the thumb.  Denies known injury.

## 2012-05-09 NOTE — ED Provider Notes (Signed)
History     CSN: [id number]  Arrival date & time 05/09/12  1121   First MD Initiated Contact with Patient 05/09/12 1123      Chief Complaint  Patient presents with  . Hand Pain    (Consider location/radiation/quality/duration/timing/severity/associated sxs/prior treatment) HPI Comments: This patient is left-handed female who reports pain in her right CMC joints and along MC of her index finger starting about 10 months ago. States it became constant 3 weeks ago. It is worse with finger extension, gripping. Pain is worse during the day. It is not waking her up at night at night. No numbness, redness, deformity, bruising, weakness. Does not recall any recent or remote history of trauma to her hand. She states she is "clumsy", and has had repeated falls, so she is unsure if she is injured her hand in one of these falls. She does not work with her hands on a regular basis. She is a smoker. She is on chronic narcotics for back pain, which is managed by the pain clinic in [place]. She states that she is taking Norco and MS Contin as prescribed her without  any relief in her hand pain. She's not tried any other therapies for her pain.  ROS as noted in HPI. All other ROS negative.   Patient is a 41 y.o. female presenting with hand pain. The history is provided by the patient. No language interpreter was used.  Hand Pain This is a chronic problem. The current episode started more than 1 week ago. The problem occurs constantly. The problem has been gradually worsening. Exacerbated by: use. She has tried nothing for the symptoms. The treatment provided no relief.    Past Medical History  Diagnosis Date  . Interstitial cystitis   . Bipolar 1 disorder   . Acid reflux   . Hypothyroidism   . Hypothyroid   . DDD (degenerative disc disease)   . Psoriasis     Past Surgical History  Procedure Date  . Total knee arthroplasty   . Foot surgery     History reviewed. No pertinent family  history.  History  Substance Use Topics  . Smoking status: Current Everyday Smoker  . Smokeless tobacco: Not on file  . Alcohol Use: No    OB History    Grav Para Term Preterm Abortions TAB SAB Ect Mult Living                  Review of Systems  Constitutional: Negative.  Negative for fever.  Musculoskeletal: Positive for arthralgias. Negative for joint swelling.  Skin: Negative for rash.  Neurological: Negative for weakness and numbness.    Allergies  Zyrtec; Benadryl; Chantix; Doxycycline; and Silicone  Home Medications   Current Outpatient Rx  Name Route Sig Dispense Refill  . ESCITALOPRAM OXALATE 10 MG PO TABS Oral Take 10 mg by mouth daily.    Marland Kitchen GABAPENTIN 100 MG PO CAPS Oral Take 100 mg by mouth 3 (three) times daily.    Marland Kitchen HYDROCODONE-ACETAMINOPHEN 10-325 MG PO TABS Oral Take 1 tablet by mouth every 6 (six) hours as needed.    . MORPHINE SULFATE ER 30 MG PO TBCR Oral Take 30 mg by mouth 2 (two) times daily.    Marland Kitchen ALPRAZOLAM 0.5 MG PO TABS Oral Take 0.5 mg by mouth 4 (four) times daily as needed. For anxiety    . ARIPIPRAZOLE 20 MG PO TABS Oral Take 20 mg by mouth 2 (two) times daily.     . DESMOPRESSIN  ACETATE 0.2 MG PO TABS Oral Take 0.4 mg by mouth at bedtime.     . DESVENLAFAXINE SUCCINATE ER 100 MG PO TB24 Oral Take 100 mg by mouth daily.    Marland Kitchen ESOMEPRAZOLE MAGNESIUM 40 MG PO CPDR Oral Take 40 mg by mouth daily before breakfast.    . FAMOTIDINE 20 MG PO TABS Oral Take 1 tablet (20 mg total) by mouth 2 (two) times daily. 14 tablet 0  . LEVOTHYROXINE SODIUM 50 MCG PO TABS Oral Take 50 mcg by mouth daily.    . MELOXICAM 15 MG PO TABS Oral Take 1 tablet (15 mg total) by mouth daily. 14 tablet 0  . TOPIRAMATE 100 MG PO TABS Oral Take 100 mg by mouth daily.      BP 103/69  Pulse 81  Temp(Src) 98.5 F (36.9 C) (Oral)  Resp 18  SpO2 97%  LMP 03/09/2012  Physical Exam  Nursing note and vitals reviewed. Constitutional: She is oriented to person, place, and time.  She appears well-developed and well-nourished. No distress.  HENT:  Head: Normocephalic and atraumatic.  Eyes: Conjunctivae and EOM are normal.  Neck: Normal range of motion.  Cardiovascular: Normal rate.   Pulmonary/Chest: Effort normal.  Abdominal: She exhibits no distension.  Musculoskeletal: Normal range of motion.       Tenderness at the right [facility] joint, and along the second metacarpal. Pain is worse with finger extension. thumb joint stable on varus/valgus stress. Neurovascularly intact in median/radial/ulnar distribution. Hand with intact motor strength 5/5 flexion / extension in all fingers against resistance and 2-point discrimination intact at 5mm in all fingers. Skin intact. No signs of trauma. Wrist WNL.    Neurological: She is alert and oriented to person, place, and time.  Skin: Skin is warm and dry.  Psychiatric: She has a normal mood and affect. Her behavior is normal. Judgment and thought content normal.    ED Course  Procedures (including critical care time)  Labs Reviewed - No data to display Dg Hand Complete Right  05/09/2012  *RADIOLOGY REPORT*  Clinical Data: Right hand pain worsening over last 3 weeks  RIGHT HAND - COMPLETE 3+ VIEW  Comparison: 02/22/2009  Findings: Three views of the right hand submitted.  No acute fracture or subluxation.  No radiopaque foreign body.  No periosteal reaction or bony erosion.  IMPRESSION: No acute fracture or subluxation.  No periosteal reaction or bony erosion.  Original Report Authenticated By: Natasha Mead, M.D.     1. Osteoarthritis of hand, right     MDM  X-ray reviewed by myself. Full report per radiologist. Will place in thumb spica, sent home on Mobic. She has plenty of narcotics given to her by her pain management clinic. Discussed imaging results, plan with patient, who agrees with plan. She will follow with Dr. Amanda Pea, hand surgeon on call in 10 days if no improvement with conservative therapy.  Luiz Blare,  MD 05/09/12 1315

## 2012-05-09 NOTE — Discharge Instructions (Signed)
Take the Mobic once daily. You may take 1 g of Tylenol up to 4 times a as needed for pain. This with the Mobic is  a good combination. Make sure you do not take the Norco and Tylenol, as they both have Tylenol in them. Do not exceed 4 g of Tylenol from all sources. Wear the hand splint until you evaluated by Dr. Amanda Pea. Return for fever above 100.4, or any other concerns.

## 2013-03-22 ENCOUNTER — Encounter (HOSPITAL_COMMUNITY): Payer: Self-pay | Admitting: *Deleted

## 2013-03-22 ENCOUNTER — Emergency Department (HOSPITAL_COMMUNITY)
Admission: EM | Admit: 2013-03-22 | Discharge: 2013-03-22 | Disposition: A | Discharge status: Home / Self Care | Payer: Medicare Other | Attending: Emergency Medicine | Admitting: Emergency Medicine

## 2013-03-22 DIAGNOSIS — E039 Hypothyroidism, unspecified: Secondary | ICD-10-CM | POA: Insufficient documentation

## 2013-03-22 DIAGNOSIS — K219 Gastro-esophageal reflux disease without esophagitis: Secondary | ICD-10-CM | POA: Insufficient documentation

## 2013-03-22 DIAGNOSIS — F17200 Nicotine dependence, unspecified, uncomplicated: Secondary | ICD-10-CM | POA: Insufficient documentation

## 2013-03-22 DIAGNOSIS — F319 Bipolar disorder, unspecified: Secondary | ICD-10-CM | POA: Insufficient documentation

## 2013-03-22 DIAGNOSIS — Z87448 Personal history of other diseases of urinary system: Secondary | ICD-10-CM | POA: Insufficient documentation

## 2013-03-22 DIAGNOSIS — M65849 Other synovitis and tenosynovitis, unspecified hand: Secondary | ICD-10-CM | POA: Insufficient documentation

## 2013-03-22 DIAGNOSIS — Z79899 Other long term (current) drug therapy: Secondary | ICD-10-CM | POA: Insufficient documentation

## 2013-03-22 DIAGNOSIS — M778 Other enthesopathies, not elsewhere classified: Secondary | ICD-10-CM

## 2013-03-22 DIAGNOSIS — Z8739 Personal history of other diseases of the musculoskeletal system and connective tissue: Secondary | ICD-10-CM | POA: Insufficient documentation

## 2013-03-22 DIAGNOSIS — M65839 Other synovitis and tenosynovitis, unspecified forearm: Secondary | ICD-10-CM | POA: Insufficient documentation

## 2013-03-22 DIAGNOSIS — Z872 Personal history of diseases of the skin and subcutaneous tissue: Secondary | ICD-10-CM | POA: Insufficient documentation

## 2013-03-22 NOTE — ED Notes (Signed)
Applied thumb spika splint to right hand.  Pt had good movement and sensations in fingers.

## 2013-03-22 NOTE — ED Notes (Signed)
Reports right thumb pain x 3 months- denies known injury- states pain worse over last 2 days
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
## 2013-03-23 NOTE — ED Provider Notes (Signed)
Medical screening examination/treatment/procedure(s) were performed by non-physician practitioner and as supervising physician I was immediately available for consultation/collaboration.  Sunnie Nielsen, MD 03/23/13 [phone]

## 2013-10-05 ENCOUNTER — Inpatient Hospital Stay (HOSPITAL_COMMUNITY)
Admission: EM | Admit: 2013-10-05 | Discharge: 2013-10-07 | DRG: 193 | Disposition: A | Discharge status: Home / Self Care | Payer: Medicare Other | Attending: Internal Medicine | Admitting: Internal Medicine

## 2013-10-05 ENCOUNTER — Encounter (HOSPITAL_COMMUNITY): Payer: Self-pay | Admitting: Emergency Medicine

## 2013-10-05 ENCOUNTER — Emergency Department (HOSPITAL_COMMUNITY): Payer: Medicare Other

## 2013-10-05 DIAGNOSIS — J9601 Acute respiratory failure with hypoxia: Secondary | ICD-10-CM | POA: Diagnosis present

## 2013-10-05 DIAGNOSIS — G8929 Other chronic pain: Secondary | ICD-10-CM | POA: Diagnosis present

## 2013-10-05 DIAGNOSIS — K219 Gastro-esophageal reflux disease without esophagitis: Secondary | ICD-10-CM | POA: Diagnosis present

## 2013-10-05 DIAGNOSIS — M549 Dorsalgia, unspecified: Secondary | ICD-10-CM | POA: Diagnosis present

## 2013-10-05 DIAGNOSIS — F17210 Nicotine dependence, cigarettes, uncomplicated: Secondary | ICD-10-CM | POA: Diagnosis present

## 2013-10-05 DIAGNOSIS — J45901 Unspecified asthma with (acute) exacerbation: Secondary | ICD-10-CM | POA: Diagnosis present

## 2013-10-05 DIAGNOSIS — J45909 Unspecified asthma, uncomplicated: Secondary | ICD-10-CM | POA: Diagnosis present

## 2013-10-05 DIAGNOSIS — N3010 Interstitial cystitis (chronic) without hematuria: Secondary | ICD-10-CM | POA: Diagnosis present

## 2013-10-05 DIAGNOSIS — R000 Tachycardia, unspecified: Secondary | ICD-10-CM | POA: Diagnosis present

## 2013-10-05 DIAGNOSIS — E039 Hypothyroidism, unspecified: Secondary | ICD-10-CM | POA: Diagnosis present

## 2013-10-05 DIAGNOSIS — F319 Bipolar disorder, unspecified: Secondary | ICD-10-CM | POA: Diagnosis present

## 2013-10-05 DIAGNOSIS — J9600 Acute respiratory failure, unspecified whether with hypoxia or hypercapnia: Secondary | ICD-10-CM | POA: Diagnosis present

## 2013-10-05 DIAGNOSIS — M329 Systemic lupus erythematosus, unspecified: Secondary | ICD-10-CM

## 2013-10-05 DIAGNOSIS — I498 Other specified cardiac arrhythmias: Secondary | ICD-10-CM | POA: Diagnosis present

## 2013-10-05 DIAGNOSIS — Z96659 Presence of unspecified artificial knee joint: Secondary | ICD-10-CM

## 2013-10-05 DIAGNOSIS — F17200 Nicotine dependence, unspecified, uncomplicated: Secondary | ICD-10-CM | POA: Diagnosis present

## 2013-10-05 DIAGNOSIS — L408 Other psoriasis: Secondary | ICD-10-CM | POA: Diagnosis present

## 2013-10-05 DIAGNOSIS — R0902 Hypoxemia: Secondary | ICD-10-CM

## 2013-10-05 DIAGNOSIS — E876 Hypokalemia: Secondary | ICD-10-CM | POA: Diagnosis present

## 2013-10-05 DIAGNOSIS — J189 Pneumonia, unspecified organism: Principal | ICD-10-CM | POA: Diagnosis present

## 2013-10-05 DIAGNOSIS — Z9981 Dependence on supplemental oxygen: Secondary | ICD-10-CM

## 2013-10-05 DIAGNOSIS — J8410 Pulmonary fibrosis, unspecified: Secondary | ICD-10-CM | POA: Diagnosis present

## 2013-10-05 HISTORY — DX: Unspecified asthma, uncomplicated: J45.909

## 2013-10-05 LAB — PRO B NATRIURETIC PEPTIDE: Pro B Natriuretic peptide (BNP): 135.8 pg/mL — ABNORMAL HIGH (ref 0–125)

## 2013-10-05 LAB — BASIC METABOLIC PANEL
BUN: 5 mg/dL — ABNORMAL LOW (ref 6–23)
Calcium: 9.1 mg/dL (ref 8.4–10.5)
Creatinine, Ser: 0.52 mg/dL (ref 0.50–1.10)
GFR calc Af Amer: >90 mL/min (ref >90)
GFR calc non Af Amer: >90 mL/min (ref >90)

## 2013-10-05 LAB — POCT I-STAT TROPONIN I

## 2013-10-05 LAB — CBC
HCT: 36.2 % (ref 36.0–46.0)
MCHC: 33.7 g/dL (ref 30.0–36.0)
MCV: 81.9 fL (ref 78.0–100.0)
RDW: 13.1 % (ref 11.5–15.5)

## 2013-10-05 MED ORDER — OXYCODONE-ACETAMINOPHEN 10-325 MG PO TABS: 1.0000 | ORAL_TABLET | Freq: Two times a day (BID) | ORAL | Status: DC

## 2013-10-05 MED ORDER — MELOXICAM 15 MG PO TABS
15.0000 mg | ORAL_TABLET | Freq: Every day | ORAL | Status: DC
  Administered 2013-10-06 – 2013-10-07 (×2): 15 mg via ORAL
  Filled 2013-10-05 (×3): qty 1

## 2013-10-05 MED ORDER — LEVOTHYROXINE SODIUM 50 MCG PO TABS
50.0000 ug | ORAL_TABLET | Freq: Every day | ORAL | Status: DC
  Administered 2013-10-06 – 2013-10-07 (×2): 50 ug via ORAL
  Filled 2013-10-05 (×3): qty 1

## 2013-10-05 MED ORDER — DESMOPRESSIN ACETATE 0.2 MG PO TABS
0.4000 mg | ORAL_TABLET | Freq: Every day | ORAL | Status: DC
  Administered 2013-10-06 (×2): 0.4 mg via ORAL
  Filled 2013-10-05 (×3): qty 2

## 2013-10-05 MED ORDER — GABAPENTIN 300 MG PO CAPS
300.0000 mg | ORAL_CAPSULE | Freq: Every day | ORAL | Status: DC
  Administered 2013-10-06 (×2): 300 mg via ORAL
  Filled 2013-10-05 (×3): qty 1

## 2013-10-05 MED ORDER — ALBUTEROL (5 MG/ML) CONTINUOUS INHALATION SOLN
10.0000 mg/h | INHALATION_SOLUTION | RESPIRATORY_TRACT | Status: AC
  Administered 2013-10-05: 10 mg/h via RESPIRATORY_TRACT
  Filled 2013-10-05: qty 20

## 2013-10-05 MED ORDER — DOXEPIN HCL 25 MG PO CAPS
25.0000 mg | ORAL_CAPSULE | Freq: Every day | ORAL | Status: DC
  Administered 2013-10-06 (×2): 25 mg via ORAL
  Filled 2013-10-05 (×3): qty 1

## 2013-10-05 MED ORDER — DULOXETINE HCL 60 MG PO CPEP
60.0000 mg | ORAL_CAPSULE | Freq: Every day | ORAL | Status: DC
  Administered 2013-10-06 – 2013-10-07 (×2): 60 mg via ORAL
  Filled 2013-10-05 (×2): qty 1

## 2013-10-05 MED ORDER — TEMAZEPAM 15 MG PO CAPS
30.0000 mg | ORAL_CAPSULE | Freq: Every evening | ORAL | Status: DC | PRN
  Administered 2013-10-06 (×2): 30 mg via ORAL
  Filled 2013-10-05 (×2): qty 4

## 2013-10-05 MED ORDER — AZITHROMYCIN 500 MG IV SOLR: 500.0000 mg | INTRAVENOUS | Status: DC

## 2013-10-05 MED ORDER — METHYLPREDNISOLONE SODIUM SUCC 125 MG IJ SOLR
80.0000 mg | Freq: Two times a day (BID) | INTRAMUSCULAR | Status: DC
  Administered 2013-10-06 – 2013-10-07 (×4): 80 mg via INTRAVENOUS
  Filled 2013-10-05 (×6): qty 1.28

## 2013-10-05 MED ORDER — POTASSIUM CHLORIDE IN NACL 20-0.9 MEQ/L-% IV SOLN
INTRAVENOUS | Status: AC
  Administered 2013-10-06: via INTRAVENOUS
  Filled 2013-10-05: qty 1000

## 2013-10-05 MED ORDER — GABAPENTIN 100 MG PO CAPS
100.0000 mg | ORAL_CAPSULE | Freq: Every day | ORAL | Status: DC
  Administered 2013-10-06 – 2013-10-07 (×2): 100 mg via ORAL
  Filled 2013-10-05 (×2): qty 1

## 2013-10-05 MED ORDER — ARIPIPRAZOLE 15 MG PO TABS
15.0000 mg | ORAL_TABLET | Freq: Every evening | ORAL | Status: DC
  Administered 2013-10-06 (×2): 15 mg via ORAL
  Filled 2013-10-05 (×3): qty 1

## 2013-10-05 MED ORDER — POTASSIUM CHLORIDE CRYS ER 20 MEQ PO TBCR
40.0000 meq | EXTENDED_RELEASE_TABLET | Freq: Once | ORAL | Status: AC
  Administered 2013-10-05: 40 meq via ORAL
  Filled 2013-10-05: qty 2

## 2013-10-05 MED ORDER — OXYCODONE-ACETAMINOPHEN 5-325 MG PO TABS
1.0000 | ORAL_TABLET | Freq: Two times a day (BID) | ORAL | Status: DC
  Administered 2013-10-06 – 2013-10-07 (×4): 1 via ORAL
  Filled 2013-10-05 (×4): qty 1

## 2013-10-05 MED ORDER — ENOXAPARIN SODIUM 40 MG/0.4ML ~~LOC~~ SOLN
40.0000 mg | Freq: Every day | SUBCUTANEOUS | Status: DC
  Administered 2013-10-06 (×2): 40 mg via SUBCUTANEOUS
  Filled 2013-10-05 (×3): qty 0.4

## 2013-10-05 MED ORDER — NICOTINE 21 MG/24HR TD PT24
21.0000 mg | MEDICATED_PATCH | Freq: Every day | TRANSDERMAL | Status: DC
  Administered 2013-10-05 – 2013-10-07 (×3): 21 mg via TRANSDERMAL
  Filled 2013-10-05 (×3): qty 1

## 2013-10-05 MED ORDER — GABAPENTIN 100 MG PO CAPS
100.0000 mg | ORAL_CAPSULE | Freq: Two times a day (BID) | ORAL | Status: DC
  Filled 2013-10-05: qty 1

## 2013-10-05 MED ORDER — OXYCODONE HCL 5 MG PO TABS
5.0000 mg | ORAL_TABLET | Freq: Two times a day (BID) | ORAL | Status: DC
  Administered 2013-10-06 (×3): 5 mg via ORAL
  Filled 2013-10-05 (×3): qty 1

## 2013-10-05 MED ORDER — ALBUTEROL SULFATE HFA 108 (90 BASE) MCG/ACT IN AERS
2.0000 | INHALATION_SPRAY | RESPIRATORY_TRACT | Status: DC | PRN
Start: 2013-10-05 — End: 2013-10-07

## 2013-10-05 MED ORDER — LEVALBUTEROL HCL 1.25 MG/0.5ML IN NEBU
1.2500 mg | INHALATION_SOLUTION | Freq: Four times a day (QID) | RESPIRATORY_TRACT | Status: DC | PRN
  Administered 2013-10-06: 1.25 mg via RESPIRATORY_TRACT
  Filled 2013-10-05 (×2): qty 0.5

## 2013-10-05 MED ORDER — DEXTROSE 5 % IV SOLN: 1.0000 g | INTRAVENOUS | Status: DC

## 2013-10-05 MED ORDER — DEXTROSE 5 % IV SOLN
1.0000 g | Freq: Once | INTRAVENOUS | Status: AC
  Administered 2013-10-05: 1 g via INTRAVENOUS
  Filled 2013-10-05: qty 10

## 2013-10-05 MED ORDER — OXYMORPHONE HCL ER 15 MG PO TB12
15.0000 mg | ORAL_TABLET | Freq: Two times a day (BID) | ORAL | Status: DC
  Filled 2013-10-05 (×2): qty 1

## 2013-10-05 MED ORDER — AZITHROMYCIN 250 MG PO TABS
500.0000 mg | ORAL_TABLET | Freq: Once | ORAL | Status: AC
  Administered 2013-10-05: 500 mg via ORAL
  Filled 2013-10-05: qty 2

## 2013-10-05 NOTE — ED Notes (Signed)
Dr. David at bedside. 

## 2013-10-05 NOTE — ED Notes (Signed)
Patient instructed to notify nurse if she felt her heart was beating too quickly. Patient denies this at this time.

## 2013-10-05 NOTE — ED Notes (Signed)
Patient requesting nicotine patch. Dr Onalee Hua notified. Orders received.

## 2013-10-05 NOTE — ED Notes (Signed)
Bed: WA01 Expected date:  Expected time:  Means of arrival:  Comments: asthma

## 2013-10-05 NOTE — ED Provider Notes (Signed)
CSN: [id number]     Arrival date & time 10/05/13  1753 History   First MD Initiated Contact with Patient 10/05/13 1801     Chief Complaint  Patient presents with  . Asthma  . Shortness of Breath   (Consider location/radiation/quality/duration/timing/severity/associated sxs/prior Treatment) HPI Comments: Patient is a 42 year old female past medical history significant for asthma, hypothyroidism, bipolar 1 disorder presented to the emergency department for 2 days of gradually shortness of breath with associated wheezing. Patient states she was seen by her primary care physician yesterday and was sent home with a refill on her inhaler, nasal spray, and cough drops. She states it is nontender home with antibiotics and she did not understand that because she "had a fever of 99.60F" patient states she only used her inhaler 2 puffs once yesterday, states she woke up this morning and has been taking 2 puffs every 4 hour as prescribed. She states she went to urgent care prior to arrival with increased shortness of breath and wheezing where she received a breathing treatment and was noted to have oxygen saturations in the 70s. She was transferred over to the emergency department she has received 5 mg total of albuterol nebulizer treatments with 1.5 mg of Atrovent and 125 mg of IV Solu-Medrol. Patient has never been hospitalized for an asthma exacerbation and never required intubation. PERC negative.   Patient is a 42 y.o. female presenting with asthma and shortness of breath.  Asthma Pertinent negatives include no chest pain or fever.  Shortness of Breath Associated symptoms: wheezing   Associated symptoms: no chest pain and no fever     Past Medical History  Diagnosis Date  . Interstitial cystitis   . Bipolar 1 disorder   . Acid reflux   . Hypothyroidism   . Hypothyroid   . DDD (degenerative disc disease)   . Psoriasis   . Asthma    Past Surgical History  Procedure Laterality Date  . Total knee  arthroplasty    . Foot surgery     No family history on file. History  Substance Use Topics  . Smoking status: Current Every Day Smoker    Types: Cigarettes  . Smokeless tobacco: Never Used  . Alcohol Use: No   OB History   Grav Para Term Preterm Abortions TAB SAB Ect Mult Living                 Review of Systems  Constitutional: Negative for fever.  Respiratory: Positive for chest tightness, shortness of breath and wheezing.   Cardiovascular: Negative for chest pain.  All other systems reviewed and are negative.    Allergies  Zyrtec; Benadryl; Chantix; Clindamycin/lincomycin; Doxycycline; Silicone; and Amoxicillin  Home Medications   No current outpatient prescriptions on file. BP 120/56  Pulse 108  Temp(Src) 98.8 F (37.1 C) (Oral)  Resp 22  Ht 4\' 10"  (1.473 m)  Wt 130 lb 4.7 oz (59.1 kg)  BMI 27.24 kg/m2  SpO2 98%  LMP 09/12/2013 Physical Exam  Constitutional: She is oriented to person, place, and time. She appears well-developed and well-nourished.  HENT:  Head: Normocephalic and atraumatic.  Right Ear: External ear normal.  Left Ear: External ear normal.  Nose: Nose normal.  Eyes: Conjunctivae are normal.  Neck: Neck supple.  Cardiovascular: Regular rhythm and normal heart sounds.  Tachycardia present.   Pulmonary/Chest: Accessory muscle usage present. Tachypnea noted. Not bradypneic. She has decreased breath sounds. She has no rales. She exhibits no tenderness.  Faint expiratory wheezes  appreciated diffusely.   Abdominal: Soft. There is no tenderness.  Musculoskeletal: Normal range of motion. She exhibits no edema.  Neurological: She is alert and oriented to person, place, and time.  Skin: Skin is warm and dry. No cyanosis.    ED Course  Procedures (including critical care time) Medications  albuterol (PROVENTIL,VENTOLIN) solution continuous neb (0 mg/hr Nebulization Stopped 10/05/13 2008)  levothyroxine (SYNTHROID, LEVOTHROID) tablet 50 mcg (not  administered)  desmopressin (DDAVP) tablet 0.4 mg (0.4 mg Oral Given 10/06/13 0030)  DULoxetine (CYMBALTA) DR capsule 60 mg (not administered)  meloxicam (MOBIC) tablet 15 mg (not administered)  ARIPiprazole (ABILIFY) tablet 15 mg (15 mg Oral Given 10/06/13 0031)  doxepin (SINEQUAN) capsule 25 mg (25 mg Oral Given 10/06/13 0034)  albuterol (PROVENTIL HFA;VENTOLIN HFA) 108 (90 BASE) MCG/ACT inhaler 2 puff (not administered)  temazepam (RESTORIL) capsule 30 mg (30 mg Oral Given 10/06/13 0030)  oxymorphone (OPANA ER) 12 hr tablet 15 mg (not administered)  enoxaparin (LOVENOX) injection 40 mg (40 mg Subcutaneous Given 10/06/13 0033)  0.9 % NaCl with KCl 20 mEq/ L  infusion ( Intravenous New Bag/Given 10/06/13 0012)  cefTRIAXone (ROCEPHIN) 1 g in dextrose 5 % 50 mL IVPB (not administered)  azithromycin (ZITHROMAX) 500 mg in dextrose 5 % 250 mL IVPB (not administered)  levalbuterol (XOPENEX) nebulizer solution 1.25 mg (not administered)  methylPREDNISolone sodium succinate (SOLU-MEDROL) 125 mg/2 mL injection 80 mg (80 mg Intravenous Given 10/06/13 0028)  nicotine (NICODERM CQ - dosed in mg/24 hours) patch 21 mg (21 mg Transdermal Patch Applied 10/05/13 2210)  oxyCODONE-acetaminophen (PERCOCET/ROXICET) 5-325 MG per tablet 1 tablet (1 tablet Oral Given 10/06/13 0032)    And  oxyCODONE (Oxy IR/ROXICODONE) immediate release tablet 5 mg (5 mg Oral Given 10/06/13 0031)  gabapentin (NEURONTIN) capsule 100 mg (not administered)  gabapentin (NEURONTIN) capsule 300 mg (300 mg Oral Given 10/06/13 0032)  cefTRIAXone (ROCEPHIN) 1 g in dextrose 5 % 50 mL IVPB (0 g Intravenous Stopped 10/05/13 2148)  azithromycin (ZITHROMAX) tablet 500 mg (500 mg Oral Given 10/05/13 2051)  potassium chloride SA (K-DUR,KLOR-CON) CR tablet 40 mEq (40 mEq Oral Given 10/05/13 2159)    Labs Review Labs Reviewed  CBC - Abnormal; Notable for the following:    WBC 13.6 (*)    All other components within normal limits  BASIC METABOLIC PANEL -  Abnormal; Notable for the following:    Potassium 2.9 (*)    Glucose, Bld 237 (*)    BUN 5 (*)    All other components within normal limits  PRO B NATRIURETIC PEPTIDE - Abnormal; Notable for the following:    Pro B Natriuretic peptide (BNP) 135.8 (*)    All other components within normal limits  CULTURE, BLOOD (ROUTINE X 2)  CULTURE, BLOOD (ROUTINE X 2)  LEGIONELLA ANTIGEN, URINE  STREP PNEUMONIAE URINARY ANTIGEN  BASIC METABOLIC PANEL  CBC WITH DIFFERENTIAL  POCT I-STAT TROPONIN I   Imaging Review Dg Chest Portable 1 View  10/05/2013   CLINICAL DATA:  Chest pain, shortness of breath, asthmatic  EXAM: PORTABLE CHEST - 1 VIEW  COMPARISON:  12/20/2011  FINDINGS: Diffuse patchy airspace process throughout both lungs could represent acute alveolar edema versus pneumonia. Normal heart size. Slightly lower lung volumes. No effusion or pneumothorax. Slight rotation to the right. No osseous abnormality.  IMPRESSION: Patchy diffuse bilateral airspace process, pneumonia is favored over edema.   Electronically Signed   By: Ruel Favors M.D.   On: 10/05/2013 19:19    EKG Interpretation  Ventricular Rate:  118 PR Interval:  109 QRS Duration: 88 QT Interval:  315 QTC Calculation: 441 R Axis:   80 Text Interpretation:  Sinus tachycardia RSR' in V1 or V2, right VCD or RVH No significant change since last tracing other than increased rate            MDM   1. Hypoxia   2. Community acquired pneumonia   3. Other psoriasis   4. Tobacco use disorder   5. Unspecified asthma(493.90)    Patient w/ asthma exacerbation and hypoxia. No improvement in work of breathing after 2 Albuterol nebulizer treatments, 1.5mg  Atrovent, and 1 hour continuous albuterol nebulizer therapy, along with 125 IV Solumedrol. CXR concerning for pneumonia. Will admit patient. Rocephin and Azithromycin given in ED. The patient appears reasonably stabilized for admission considering the current resources, flow, and  capabilities available in the ED at this time, and I doubt any other [facility] requiring further screening and/or treatment in the ED prior to admission.    Jeannetta Ellis, PA-C 10/06/13 0103

## 2013-10-05 NOTE — ED Notes (Signed)
Lab at bedside

## 2013-10-05 NOTE — ED Notes (Signed)
Patient ok for POs per Dr Blinda Leatherwood. Patient given sandwich and coke.

## 2013-10-05 NOTE — ED Notes (Signed)
Patient placed on 4L [place]

## 2013-10-05 NOTE — ED Notes (Signed)
Patient was not on room air at the time vital signs were obtained, patient on 15L nebulized.

## 2013-10-05 NOTE — H&P (Signed)
PCP:   Paulino Rily, MD   Chief Complaint:  sob  HPI: 42 yo female with h/o asthma, psoriasis, bipolar disorfer, chronic back pain comes in from urgent care for hypoxia in the 70s, sob wheezing for several days.  Low grade temp.  Dry nonprod cough.  No chest pain.  No le edema or swelling.  Has been using her alb inhaler a lot of the last several days and not really happening.  No n/v/d.  No abd pain.  No h/o heart failure.  Smokes.  Feels much better with treatment she has already received.  Review of Systems:  Positive and negative as per HPI otherwise all other systems are negative  Past Medical History: Past Medical History  Diagnosis Date  . Interstitial cystitis   . Bipolar 1 disorder   . Acid reflux   . Hypothyroidism   . Hypothyroid   . DDD (degenerative disc disease)   . Psoriasis   . Asthma    Past Surgical History  Procedure Laterality Date  . Total knee arthroplasty    . Foot surgery      Medications: Prior to Admission medications   Medication Sig Start Date End Date Taking? Authorizing Provider  ABILIFY 15 MG tablet Take 15 mg by mouth every evening.  09/07/13  Yes Historical Provider, MD  clorazepate (TRANXENE) 15 MG tablet Take 15 mg by mouth 2 (two) times daily as needed for anxiety.  09/06/13  Yes Historical Provider, MD  desmopressin (DDAVP) 0.2 MG tablet Take 0.4 mg by mouth at bedtime.    Yes Historical Provider, MD  doxepin (SINEQUAN) 25 MG capsule Take 25 mg by mouth at bedtime.  09/15/13  Yes Historical Provider, MD  DULoxetine (CYMBALTA) 60 MG capsule Take 60 mg by mouth daily.  09/21/13  Yes Historical Provider, MD  esomeprazole (NEXIUM) 40 MG capsule Take 40 mg by mouth daily before breakfast.   Yes Historical Provider, MD  gabapentin (NEURONTIN) 100 MG capsule Take 100 mg by mouth 2 (two) times daily. 100mg  in the AM 300 mg in the evening   Yes Historical Provider, MD  ibuprofen (ADVIL,MOTRIN) 200 MG tablet Take 400 mg by mouth every 6 (six)  hours as needed.   Yes Historical Provider, MD  levothyroxine (SYNTHROID, LEVOTHROID) 50 MCG tablet Take 50 mcg by mouth daily.   Yes Historical Provider, MD  meloxicam (MOBIC) 15 MG tablet Take 15 mg by mouth daily.  09/05/13  Yes Historical Provider, MD  oxyCODONE-acetaminophen (PERCOCET) 10-325 MG per tablet Take 1 tablet by mouth 2 (two) times daily.   Yes Historical Provider, MD  oxymorphone (OPANA ER) 15 MG 12 hr tablet Take 15 mg by mouth every 12 (twelve) hours.   Yes Historical Provider, MD  temazepam (RESTORIL) 30 MG capsule Take 30 mg by mouth at bedtime as needed for sleep.  09/03/13  Yes Historical Provider, MD  VENTOLIN HFA 108 (90 BASE) MCG/ACT inhaler Inhale 2 puffs into the lungs every 4 (four) hours as needed for wheezing or shortness of breath.  10/04/13  Yes Historical Provider, MD    Allergies:   Allergies  Allergen Reactions  . Zyrtec [Cetirizine Hcl] Shortness Of Breath    Throat swelling  . Benadryl [Diphenhydramine Hcl] Hives  . Chantix [Varenicline]   . Clindamycin/Lincomycin Hives  . Doxycycline     REACTION: NAUSEA  . Silicone   . Amoxicillin Rash    Social History:  reports that she has been smoking Cigarettes.  She has been smoking  about 0.00 packs per day. She has never used smokeless tobacco. She reports that she does not drink alcohol or use illicit drugs.  Family History: none  Physical Exam: Filed Vitals:   10/05/13 1807 10/05/13 1910 10/05/13 1941 10/05/13 2014  BP:   120/56   Pulse:   130 129  Temp:   99.1 F (37.3 C)   TempSrc:   Oral   Resp:   19 31  SpO2: 97% 92% 93% 94%   General appearance: alert, cooperative and no distress speaks in full sentences without difficulty Head: Normocephalic, without obvious abnormality, atraumatic Eyes: negative Nose: Nares normal. Septum midline. Mucosa normal. No drainage or sinus tenderness. Neck: no JVD and supple, symmetrical, trachea midline Lungs: diminished breath sounds bibasilar crackles  mild, no wheezing, good air movement Heart: regular rate and rhythm tachycardic.  No m/r/g.   Abdomen: soft, non-tender; bowel sounds normal; no masses,  no organomegaly Extremities: extremities normal, atraumatic, no cyanosis or edema Pulses: 2+ and symmetric Skin: Skin color, texture, turgor normal. No rashes or lesions Neurologic: Grossly normal    Labs on Admission:   Recent Labs  10/05/13 2020  NA 138  K 2.9*  CL 101  CO2 23  GLUCOSE 237*  BUN 5*  CREATININE 0.52  CALCIUM 9.1    Recent Labs  10/05/13 2020  WBC 13.6*  HGB 12.2  HCT 36.2  MCV 81.9  PLT 255    Radiological Exams on Admission: Dg Chest Portable 1 View  10/05/2013   CLINICAL DATA:  Chest pain, shortness of breath, asthmatic  EXAM: PORTABLE CHEST - 1 VIEW  COMPARISON:  12/20/2011  FINDINGS: Diffuse patchy airspace process throughout both lungs could represent acute alveolar edema versus pneumonia. Normal heart size. Slightly lower lung volumes. No effusion or pneumothorax. Slight rotation to the right. No osseous abnormality.  IMPRESSION: Patchy diffuse bilateral airspace process, pneumonia is favored over edema.   Electronically Signed   By: Ruel Favors M.D.   On: 10/05/2013 19:19    Assessment/Plan  42 yo female with h/o asthma with CAP  Principal Problem:   PNA (pneumonia) with underlying asthma.  pna pathway on rocephin and azithro.  Steroids.  freq nebs, change to xoponex due to sinus tach from albuterol.  Oxygen supplementation.    Active Problems:   SMOKER   ASTHMA   PULMONARY FIBROSIS ILD POST INFLAMMATORY CHRONIC   PSORIASIS   Acute respiratory failure with hypoxia   Hypokalemia   Sinus tachycardia    Hulet Ehrmann A 10/05/2013, 9:39 PM

## 2013-10-05 NOTE — ED Notes (Signed)
Pt sent from urgent care with c/o SOB and asthma attack. Per EMS staff at [facility] reported pt had O2 sats in 70's. Pt had 5 mg albuterol at urgent care and 5 mg albuterol with 1.5 atrovent and 125 solumedrol IV en route.

## 2013-10-06 ENCOUNTER — Encounter (HOSPITAL_COMMUNITY): Payer: Self-pay | Admitting: *Deleted

## 2013-10-06 DIAGNOSIS — N3010 Interstitial cystitis (chronic) without hematuria: Secondary | ICD-10-CM

## 2013-10-06 DIAGNOSIS — J9600 Acute respiratory failure, unspecified whether with hypoxia or hypercapnia: Secondary | ICD-10-CM

## 2013-10-06 DIAGNOSIS — E876 Hypokalemia: Secondary | ICD-10-CM

## 2013-10-06 DIAGNOSIS — F17200 Nicotine dependence, unspecified, uncomplicated: Secondary | ICD-10-CM | POA: Diagnosis present

## 2013-10-06 LAB — BASIC METABOLIC PANEL
BUN: 8 mg/dL (ref 6–23)
CO2: 23 mEq/L (ref 19–32)
Chloride: 103 mEq/L (ref 96–112)
Creatinine, Ser: 0.52 mg/dL (ref 0.50–1.10)
GFR calc Af Amer: >90 mL/min (ref >90)
Potassium: 4.2 mEq/L (ref 3.5–5.1)

## 2013-10-06 LAB — CBC WITH DIFFERENTIAL/PLATELET
Basophils Absolute: 0 10*3/uL (ref 0.0–0.1)
HCT: 35.3 % — ABNORMAL LOW (ref 36.0–46.0)
Hemoglobin: 12 g/dL (ref 12.0–15.0)
Lymphocytes Relative: 5 % — ABNORMAL LOW (ref 12–46)
MCV: 82.5 fL (ref 78.0–100.0)
Monocytes Absolute: 0.2 10*3/uL (ref 0.1–1.0)
Monocytes Relative: 1 % — ABNORMAL LOW (ref 3–12)
Neutro Abs: 13 10*3/uL — ABNORMAL HIGH (ref 1.7–7.7)
Neutrophils Relative %: 93 % — ABNORMAL HIGH (ref 43–77)
RBC: 4.28 MIL/uL (ref 3.87–5.11)
RDW: 13.5 % (ref 11.5–15.5)
WBC: 14 10*3/uL — ABNORMAL HIGH (ref 4.0–10.5)

## 2013-10-06 MED ORDER — THIAMINE HCL 100 MG/ML IJ SOLN
100.0000 mg | Freq: Every day | INTRAMUSCULAR | Status: DC
  Filled 2013-10-06: qty 1

## 2013-10-06 MED ORDER — PNEUMOCOCCAL VAC POLYVALENT 25 MCG/0.5ML IJ INJ
0.5000 mL | INJECTION | INTRAMUSCULAR | Status: AC
  Administered 2013-10-07: 0.5 mL via INTRAMUSCULAR
  Filled 2013-10-06 (×2): qty 0.5

## 2013-10-06 MED ORDER — VITAMIN B-1 100 MG PO TABS
100.0000 mg | ORAL_TABLET | Freq: Every day | ORAL | Status: DC
  Administered 2013-10-06: 100 mg via ORAL
  Filled 2013-10-06: qty 1

## 2013-10-06 MED ORDER — LORAZEPAM 1 MG PO TABS
1.0000 mg | ORAL_TABLET | Freq: Four times a day (QID) | ORAL | Status: DC | PRN
  Administered 2013-10-06: 1 mg via ORAL
  Filled 2013-10-06: qty 1

## 2013-10-06 MED ORDER — DEXTROSE 5 % IV SOLN
500.0000 mg | INTRAVENOUS | Status: DC
  Administered 2013-10-06: 500 mg via INTRAVENOUS
  Filled 2013-10-06 (×2): qty 500

## 2013-10-06 MED ORDER — LORAZEPAM 2 MG/ML IJ SOLN: 1.0000 mg | Freq: Four times a day (QID) | INTRAMUSCULAR | Status: DC | PRN

## 2013-10-06 MED ORDER — DEXTROSE 5 % IV SOLN
1.0000 g | INTRAVENOUS | Status: DC
  Administered 2013-10-06: 1 g via INTRAVENOUS
  Filled 2013-10-06 (×2): qty 10

## 2013-10-06 MED ORDER — OXYMORPHONE HCL ER 15 MG PO TB12
15.0000 mg | ORAL_TABLET | Freq: Two times a day (BID) | ORAL | Status: DC
  Administered 2013-10-06 – 2013-10-07 (×3): 15 mg via ORAL

## 2013-10-06 MED ORDER — ADULT MULTIVITAMIN W/MINERALS CH
1.0000 | ORAL_TABLET | Freq: Every day | ORAL | Status: DC
  Administered 2013-10-06: 1 via ORAL
  Filled 2013-10-06: qty 1

## 2013-10-06 MED ORDER — FOLIC ACID 1 MG PO TABS
1.0000 mg | ORAL_TABLET | Freq: Every day | ORAL | Status: DC
  Administered 2013-10-06: 1 mg via ORAL
  Filled 2013-10-06: qty 1

## 2013-10-06 MED ORDER — BENZONATATE 100 MG PO CAPS
200.0000 mg | ORAL_CAPSULE | Freq: Three times a day (TID) | ORAL | Status: DC | PRN
  Administered 2013-10-06 – 2013-10-07 (×4): 200 mg via ORAL
  Filled 2013-10-06 (×4): qty 2

## 2013-10-06 MED ORDER — INFLUENZA VAC SPLIT QUAD 0.5 ML IM SUSP
0.5000 mL | INTRAMUSCULAR | Status: AC
  Administered 2013-10-07: 0.5 mL via INTRAMUSCULAR
  Filled 2013-10-06 (×2): qty 0.5

## 2013-10-06 MED ORDER — LORAZEPAM 2 MG/ML IJ SOLN
0.5000 mg | Freq: Once | INTRAMUSCULAR | Status: AC
  Administered 2013-10-06: 0.5 mg via INTRAVENOUS
  Filled 2013-10-06: qty 1

## 2013-10-06 NOTE — Progress Notes (Signed)
Inpatient Diabetes Program Recommendations  AACE/ADA: New Consensus Statement on Inpatient Glycemic Control (2013)  Target Ranges:  Prepandial:   less than 140 mg/dL      Peak postprandial:   less than 180 mg/dL (1-2 hours)      Critically ill patients:  140 - 180 mg/dL   Reason for Visit: Hyperglycemia   Results for [name], [name] (MRN [id number]) as of 10/06/2013 11:14  Ref. Range 10/05/2013 20:20 10/06/2013 05:19  Glucose Latest Range: 70-99 mg/dL 409 (H) 811 (H)   Likely steroid-induced hyperglycemia.   Inpatient Diabetes Program Recommendations Correction (SSI): Add Novolog sensitive tidwc while on steroids HgbA1C: Check HgbA1C to assess glycemic control prior to hospitalization  Note: Will follow while inpatient. Thank you. Ailene Ards, RD, LDN, CDE Inpatient Diabetes Coordinator [phone]

## 2013-10-06 NOTE — Progress Notes (Signed)
[facility] PROGRESS NOTE  [name]:[id number] DOB: 1971/06/05 DOA: 10/05/2013 PCP: Paulino Rily, MD  Assessment/Plan:  Pneumonia -Continue antibiotics, will obtain an ambulatory SpO2 in the a.m. to evaluate for appropriateness for patient to be discharged. Patient aware that if she desats will not be discharged -Continue methylprednisolone  Acute respiratory failure with hypoxia;  -patient still dependent on 2 L O2 via nasal cannula to have SpO2 >90s.   Asthma -Continue home medication  Hypokalemia -Resolved Will continue to monitor  Bipolar disorder -Continue medication  Nicotine dependence -Continue nicotine patch -Patient counseled extensively on the sequela of continuing to smoke with her comorbidities  Interstitial cystitis -Continue DDAVP   Code Status: Full Family Communication: Family present for discussion of plan of care Disposition Plan:    Consultants:    Procedures:    Antibiotics:  Azithromycin 11/6>>>  Ceftriaxone 11/6>>>  HPI/Subjective: 42 yo WF PMHx Hypothyroid, Asthma, psoriasis, bipolar disorfer, chronic back pain comes in from urgent care for hypoxia in the 70s, sob wheezing for several days. Low grade temp. Dry nonprod cough. No chest pain. No le edema or swelling. Has been using her alb inhaler a lot of the last several days and not really happening. No n/v/d. No abd pain. No h/o heart failure. Smokes. Feels much better with treatment she has already received. TODAY states has decreased SOB, able to hold a conversation. Requests to know when she might be discharged. Patient also states has not seen a pulmonologist in a couple of years because her asthma and only bothers her when the weather turns cold.    Objective: Filed Vitals:   10/06/13 1055 10/06/13 1334 10/06/13 1853 10/06/13 2047  BP: 124/72 128/64  115/67  Pulse: 94 82  89  Temp:  97.9 F (36.6 C) 98.5 F (36.9 C) 98.6 F (37 C)  TempSrc:  Oral Oral  Oral  Resp:  18  21  Height:      Weight:      SpO2:  95%  94%    Intake/Output Summary (Last 24 hours) at 10/06/13 2209 Last data filed at 10/06/13 1700  Gross per 24 hour  Intake   1790 ml  Output   1675 ml  Net    115 ml   Filed Weights   10/05/13 2254  Weight: 59.1 kg (130 lb 4.7 oz)    Exam:   General:  A./O. x4, NAD (with 2 L O2 via nasal cannula)  Cardiovascular: Regular in rate, negative murmurs rubs or gallops, DP/PT pulse 2+ bilateral  Respiratory: Clear to auscultation bilateral  Abdomen: Soft, nontender, nondistended, plus possible  Musculoskeletal: Negative pedal edema bilateral   Data Reviewed: Basic Metabolic Panel:  Recent Labs Lab 10/05/13 2020 10/06/13 0519  NA 138 137  K 2.9* 4.2  CL 101 103  CO2 23 23  GLUCOSE 237* 239*  BUN 5* 8  CREATININE 0.52 0.52  CALCIUM 9.1 9.6   Liver Function Tests: No results found for this basename: AST, ALT, ALKPHOS, BILITOT, PROT, ALBUMIN,  in the last 168 hours No results found for this basename: LIPASE, AMYLASE,  in the last 168 hours No results found for this basename: AMMONIA,  in the last 168 hours CBC:  Recent Labs Lab 10/05/13 2020 10/06/13 0519  WBC 13.6* 14.0*  NEUTROABS  --  13.0*  HGB 12.2 12.0  HCT 36.2 35.3*  MCV 81.9 82.5  PLT 255 258   Cardiac Enzymes: No results found for this basename: CKTOTAL, CKMB, CKMBINDEX, TROPONINI,  in the last 168 hours BNP (last 3 results)  Recent Labs  10/05/13 2152  PROBNP 135.8*   CBG: No results found for this basename: GLUCAP,  in the last 168 hours  No results found for this or any previous visit (from the past 240 hour(s)).   Studies: Dg Chest Portable 1 View  10/05/2013   CLINICAL DATA:  Chest pain, shortness of breath, asthmatic  EXAM: PORTABLE CHEST - 1 VIEW  COMPARISON:  12/20/2011  FINDINGS: Diffuse patchy airspace process throughout both lungs could represent acute alveolar edema versus pneumonia. Normal heart size. Slightly lower  lung volumes. No effusion or pneumothorax. Slight rotation to the right. No osseous abnormality.  IMPRESSION: Patchy diffuse bilateral airspace process, pneumonia is favored over edema.   Electronically Signed   By: Ruel Favors M.D.   On: 10/05/2013 19:19    Scheduled Meds: . ARIPiprazole  15 mg Oral QPM  . azithromycin  500 mg Intravenous Q24H  . cefTRIAXone (ROCEPHIN)  IV  1 g Intravenous Q24H  . desmopressin  0.4 mg Oral QHS  . doxepin  25 mg Oral QHS  . DULoxetine  60 mg Oral Daily  . enoxaparin (LOVENOX) injection  40 mg Subcutaneous QHS  . gabapentin  100 mg Oral Daily  . gabapentin  300 mg Oral QHS  . [START ON 10/07/2013] influenza vac split quadrivalent PF  0.5 mL Intramuscular Tomorrow-1000  . levothyroxine  50 mcg Oral Daily  . LORazepam  0.5 mg Intravenous Once  . meloxicam  15 mg Oral Daily  . methylPREDNISolone (SOLU-MEDROL) injection  80 mg Intravenous Q12H  . nicotine  21 mg Transdermal Daily  . oxyCODONE-acetaminophen  1 tablet Oral BID   And  . oxyCODONE  5 mg Oral BID  . oxymorphone  15 mg Oral BID  . [START ON 10/07/2013] pneumococcal 23 valent vaccine  0.5 mL Intramuscular Tomorrow-1000   Continuous Infusions:   Principal Problem:   PNA (pneumonia) Active Problems:   SMOKER   ASTHMA   PULMONARY FIBROSIS ILD POST INFLAMMATORY CHRONIC   PSORIASIS   Acute respiratory failure with hypoxia   Hypokalemia   Sinus tachycardia   Nicotine dependence    Time spent:50 minutes   Scout Guyett, J  [facility] Pager [phone]. If 7PM-7AM, please contact night-coverage at [email or web address], password [email or web address] 10/06/2013, 10:09 PM  LOS: 1 day

## 2013-10-06 NOTE — Care Management Note (Addendum)
    Page 1 of 1   10/07/2013     2:25:57 PM   CARE MANAGEMENT NOTE 10/07/2013  Patient:  CAILEY, TRIGUEROS   Account Number:  [id number]  Date Initiated:  10/06/2013  Documentation initiated by:  [facility]  Subjective/Objective Assessment:   42 Y/O F ADMITTED W/PNA.     Action/Plan:   FROM HOME W/FAMILY.HAS PCP,PHARMACY.   Anticipated DC Date:  10/07/2013   Anticipated DC Plan:  HOME/SELF CARE      DC Planning Services  CM consult      Choice offered to / List presented to:  C-1 Patient   DME arranged  OXYGEN  NEBULIZER MACHINE  NEBULIZER/MEDS      DME agency  [facility]        Status of service:  Completed, signed off Medicare Important Message given?   (If response is "NO", the following Medicare IM given date fields will be blank) Date Medicare IM given:   Date Additional Medicare IM given:    Discharge Disposition:  HOME/SELF CARE  Per UR Regulation:  Reviewed for med. necessity/level of care/duration of stay  If discussed at Long Length of Stay Meetings, dates discussed:    Comments:  10/07/13 Kharon Hixon RN,BSN NCM [phone] 02 SATS RA REST 86%,QUALIFIED FOR HOME 02.ORDERED FOR HOME 02,& NEB/MACHINE.[facility] DME REP GERMAINE AWARE OF ORDERS,DELIVERED DME  TO RM.  10/06/13 Finnigan Warriner RN,BSN NCM [phone] NO ANTICIPATED D/C NEEDS.

## 2013-10-06 NOTE — Progress Notes (Signed)
Patient stated that she feels "fidgety and shaky".  Assessed that patient drinks 3+ alcoholic drinks daily at home.  MD notified, CIWA protocol ordered.  Patient assessed at 14 on CIWA scale.  PRN Ativan administered, MD notified.  Will continue to monitor.

## 2013-10-07 LAB — CBC WITH DIFFERENTIAL/PLATELET
Basophils Absolute: 0 10*3/uL (ref 0.0–0.1)
Eosinophils Relative: 0 % (ref 0–5)
HCT: 36.1 % (ref 36.0–46.0)
Lymphocytes Relative: 5 % — ABNORMAL LOW (ref 12–46)
MCHC: 33.5 g/dL (ref 30.0–36.0)
Monocytes Absolute: 0.8 10*3/uL (ref 0.1–1.0)
Monocytes Relative: 3 % (ref 3–12)
Neutrophils Relative %: 92 % — ABNORMAL HIGH (ref 43–77)
Platelets: 298 10*3/uL (ref 150–400)
RBC: 4.33 MIL/uL (ref 3.87–5.11)
RDW: 13.5 % (ref 11.5–15.5)
WBC: 26.8 10*3/uL — ABNORMAL HIGH (ref 4.0–10.5)

## 2013-10-07 LAB — COMPREHENSIVE METABOLIC PANEL
Albumin: 3 g/dL — ABNORMAL LOW (ref 3.5–5.2)
Alkaline Phosphatase: 228 U/L — ABNORMAL HIGH (ref 39–117)
BUN: 18 mg/dL (ref 6–23)
CO2: 22 mEq/L (ref 19–32)
Chloride: 102 mEq/L (ref 96–112)
Creatinine, Ser: 0.52 mg/dL (ref 0.50–1.10)
GFR calc Af Amer: >90 mL/min (ref >90)
GFR calc non Af Amer: >90 mL/min (ref >90)
Glucose, Bld: 159 mg/dL — ABNORMAL HIGH (ref 70–99)
Sodium: 135 mEq/L (ref 135–145)
Total Bilirubin: 0.3 mg/dL (ref 0.3–1.2)
Total Protein: 7 g/dL (ref 6.0–8.3)

## 2013-10-07 LAB — STREP PNEUMONIAE URINARY ANTIGEN: Strep Pneumo Urinary Antigen: NEGATIVE

## 2013-10-07 MED ORDER — ESOMEPRAZOLE MAGNESIUM 40 MG PO CPDR
40.0000 mg | DELAYED_RELEASE_CAPSULE | Freq: Every day | ORAL | Status: DC
  Administered 2013-10-07: 40 mg via ORAL
  Filled 2013-10-07: qty 1

## 2013-10-07 MED ORDER — LEVALBUTEROL HCL 1.25 MG/0.5ML IN NEBU: 1.2500 mg | INHALATION_SOLUTION | Freq: Four times a day (QID) | RESPIRATORY_TRACT | Status: DC | PRN

## 2013-10-07 MED ORDER — BENZONATATE 200 MG PO CAPS: 200.0000 mg | ORAL_CAPSULE | Freq: Three times a day (TID) | ORAL | Status: DC | PRN

## 2013-10-07 MED ORDER — OXYCODONE-ACETAMINOPHEN 10-325 MG PO TABS: 1.0000 | ORAL_TABLET | Freq: Two times a day (BID) | ORAL | Status: DC

## 2013-10-07 MED ORDER — LEVOFLOXACIN 500 MG PO TABS: 500.0000 mg | ORAL_TABLET | Freq: Every day | ORAL | Status: DC

## 2013-10-07 MED ORDER — NON FORMULARY: 40.0000 mg | Freq: Every day | Status: DC

## 2013-10-07 MED ORDER — PREDNISONE 50 MG PO TABS: ORAL_TABLET | ORAL | Status: DC

## 2013-10-07 NOTE — Progress Notes (Signed)
Pt has O2 sat of 86% on RA at rest

## 2013-10-07 NOTE — Discharge Summary (Signed)
Physician Discharge Summary  VIVIANA TRIMBLE ZOX:[id number] DOB: 01-06-1971 DOA: 10/05/2013  PCP: Paulino Rily, MD  Admit date: 10/05/2013 Discharge date: 10/07/2013  Recommendations for Outpatient Follow-up:  1. Pt will need to follow up with PCP in 2-3 weeks post discharge 2. Please obtain BMP to evaluate electrolytes and kidney function 3. Please also check CBC to evaluate Hg and Hct levels 4. Pt made aware that WBC is elevated and that potential culprit is steroids but needs to be checked in 1 week to ensure resolving  5. Please note that pt was discharged on Levaquin for PNA management  6. Pt also discharged on Prednisone taper pack 7. Please note pt desaturated down to mid 80's on RA at rest and was therefore discharged on oxygen  8. Will defer decision to PCP to check O2 saturations with no oxygen and to determine duration of oxygen therapy 9. Pt may need referral to PCCM for further evaluation and management   Discharge Diagnoses: PNA, community acquired, no bacterial morphotype identified  Principal Problem:   PNA (pneumonia) Active Problems:   SMOKER   ASTHMA   PULMONARY FIBROSIS ILD POST INFLAMMATORY CHRONIC   PSORIASIS   Acute respiratory failure with hypoxia   Hypokalemia   Sinus tachycardia   Nicotine dependence   Chronic interstitial cystitis  Discharge Condition: Stable  Diet recommendation: Heart healthy diet discussed in details   History of present illness:  42 yo WF PMHx Hypothyroid, Asthma, psoriasis, bipolar disorfer, chronic back pain comes in from urgent care for hypoxia in the 70s, sob wheezing for several days. Low grade temp. Dry nonprod cough. No chest pain. No le edema or swelling. No n/v/d. No abd pain. No h/o heart failure. Smokes. Feels much better with treatment she has already received with nebulizer.   Pneumonia  - pt started on Solumedrol, BD's scheduled and as needed - ABX transitioned to Levaquin - pt also discharged on oxygen due to  persistent desaturation  - pt advised to follow up with PCP and to have referral provided to PCCM - pt insisting on going home today and made aware that her management and evaluation is not completed yet  - she is aware and wants to go home with follow up  - leukocytosis most likely secondary to steroids but needs to be followed up  Acute respiratory failure with hypoxia;  - patient still dependent on 2 L O2 via nasal cannula to have SpO2 >90s.  - etiology is not clear and pt made aware that further evaluation is necessary but she wants to go home - pt made aware of need for immediate follow up with PCP Asthma  - Continue home medication  Hypokalemia  - supplemented and within normal limits this AM Bipolar disorder  - Continue home medication  Nicotine dependence  - Continue nicotine patch  - Patient counseled extensively on the sequela of continuing to smoke with her comorbidities  Interstitial cystitis  - Continue DDAVP   Code Status: Full  Family Communication: Family present for discussion of plan of care    Procedures/Studies: Dg Chest Portable 1 View   10/05/2013  Patchy diffuse bilateral airspace process, pneumonia is favored over edema.   Consultations:  None Antibiotics:  Levaquin provided on discharge   Discharge Exam: Filed Vitals:   10/07/13 0616  BP: 122/74  Pulse: 75  Temp: 97.8 F (36.6 C)  Resp: 22   Filed Vitals:   10/06/13 1334 10/06/13 1853 10/06/13 2047 10/07/13 0616  BP: 128/64  115/67 122/74  Pulse: 82  89 75  Temp: 97.9 F (36.6 C) 98.5 F (36.9 C) 98.6 F (37 C) 97.8 F (36.6 C)  TempSrc: Oral Oral Oral Oral  Resp: 18  21 22   Height:      Weight:      SpO2: 95%  94% 92%    General: Pt is alert, follows commands appropriately, not in acute distress Cardiovascular: Regular rate and rhythm, S1/S2 +, no murmurs, no rubs, no gallops Respiratory: Clear to auscultation bilaterally, no wheezing, no crackles, no rhonchi Abdominal: Soft, non  tender, non distended, bowel sounds +, no guarding Extremities: no edema, no cyanosis, pulses palpable bilaterally DP and PT Neuro: Grossly nonfocal  Discharge Instructions  Discharge Orders   Future Orders Complete By Expires   Diet - low sodium heart healthy  As directed    Increase activity slowly  As directed        Medication List         ABILIFY 15 MG tablet  Generic drug:  ARIPiprazole  Take 15 mg by mouth every evening.     benzonatate 200 MG capsule  Commonly known as:  TESSALON  Take 1 capsule (200 mg total) by mouth 3 (three) times daily as needed for cough.     clorazepate 15 MG tablet  Commonly known as:  TRANXENE  Take 15 mg by mouth 2 (two) times daily as needed for anxiety.     desmopressin 0.2 MG tablet  Commonly known as:  DDAVP  Take 0.4 mg by mouth at bedtime.     doxepin 25 MG capsule  Commonly known as:  SINEQUAN  Take 25 mg by mouth at bedtime.     DULoxetine 60 MG capsule  Commonly known as:  CYMBALTA  Take 60 mg by mouth daily.     esomeprazole 40 MG capsule  Commonly known as:  NEXIUM  Take 40 mg by mouth daily before breakfast.     gabapentin 100 MG capsule  Commonly known as:  NEURONTIN  - Take 100 mg by mouth 2 (two) times daily. 100mg  in the AM  - 300 mg in the evening     ibuprofen 200 MG tablet  Commonly known as:  ADVIL,MOTRIN  Take 400 mg by mouth every 6 (six) hours as needed.     levalbuterol 1.25 MG/0.5ML nebulizer solution  Commonly known as:  XOPENEX  Take 1.25 mg by nebulization every 6 (six) hours as needed for wheezing or shortness of breath.     levofloxacin 500 MG tablet  Commonly known as:  LEVAQUIN  Take 1 tablet (500 mg total) by mouth daily.     levothyroxine 50 MCG tablet  Commonly known as:  SYNTHROID, LEVOTHROID  Take 50 mcg by mouth daily.     meloxicam 15 MG tablet  Commonly known as:  MOBIC  Take 15 mg by mouth daily.     oxyCODONE-acetaminophen 10-325 MG per tablet  Commonly known as:   PERCOCET  Take 1 tablet by mouth 2 (two) times daily.     oxymorphone 15 MG 12 hr tablet  Commonly known as:  OPANA ER  Take 15 mg by mouth every 12 (twelve) hours.     predniSONE 50 MG tablet  Commonly known as:  DELTASONE  Take 50 mg tablet today and taper down by 10 mg daily until completed     temazepam 30 MG capsule  Commonly known as:  RESTORIL  Take 30 mg by mouth at bedtime  as needed for sleep.     VENTOLIN HFA 108 (90 BASE) MCG/ACT inhaler  Generic drug:  albuterol  Inhale 2 puffs into the lungs every 4 (four) hours as needed for wheezing or shortness of breath.           Follow-up Information   Follow up with Paulino Rily, MD In 1 week.   Specialty:  Family Medicine   Contact information:   [address] [place] [address] [phone]       Follow up with Debbora Presto, MD. (call my cell phone [phone] as needed if symptoms worsen)    Specialty:  Internal Medicine   Contact information:   [address] [place] [address] [phone]        The results of significant diagnostics from this hospitalization (including imaging, microbiology, ancillary and laboratory) are listed below for reference.     Microbiology: Recent Results (from the past 240 hour(s))  CULTURE, BLOOD (ROUTINE X 2)     Status: None   Collection Time    10/05/13  9:52 PM      Result Value Range Status   Specimen Description BLOOD RIGHT ANTECUBITAL   Final   Special Requests BOTTLES DRAWN AEROBIC AND ANAEROBIC    Final   Culture  Setup Time     Final   Value: 10/06/2013 05:14     Performed at [facility]   Culture     Final   Value:        BLOOD CULTURE RECEIVED NO GROWTH TO DATE CULTURE WILL BE HELD FOR 5 DAYS BEFORE ISSUING A FINAL NEGATIVE REPORT     Performed at [facility]   Report Status PENDING   Incomplete  CULTURE, BLOOD (ROUTINE X 2)     Status: None   Collection Time    10/05/13 11:35 PM      Result Value Range Status    Specimen Description BLOOD RIGHT ANTECUBITAL   Final   Special Requests BOTTLES DRAWN AEROBIC AND ANAEROBIC    Final   Culture  Setup Time     Final   Value: 10/06/2013 05:14     Performed at [facility]   Culture     Final   Value:        BLOOD CULTURE RECEIVED NO GROWTH TO DATE CULTURE WILL BE HELD FOR 5 DAYS BEFORE ISSUING A FINAL NEGATIVE REPORT     Performed at [facility]   Report Status PENDING   Incomplete     Labs: Basic Metabolic Panel:  Recent Labs Lab 10/05/13 2020 10/06/13 0519 10/07/13 0456  NA 138 137 135  K 2.9* 4.2 4.0  CL 101 103 102  CO2 23 23 22   GLUCOSE 237* 239* 159*  BUN 5* 8 18  CREATININE 0.52 0.52 0.52  CALCIUM 9.1 9.6 9.8   Liver Function Tests:  Recent Labs Lab 10/07/13 0456  AST 52*  ALT 52*  ALKPHOS 228*  BILITOT 0.3  PROT 7.0  ALBUMIN 3.0*   CBC:  Recent Labs Lab 10/05/13 2020 10/06/13 0519 10/07/13 0456  WBC 13.6* 14.0* 26.8*  NEUTROABS  --  13.0* 24.7*  HGB 12.2 12.0 12.1  HCT 36.2 35.3* 36.1  MCV 81.9 82.5 83.4  PLT 255 258 298    BNP (last 3 results)  Recent Labs  10/05/13 2152  PROBNP 135.8*   CBG: No results found for this basename: GLUCAP,  in the last 168 hours  SIGNED:  Time coordinating discharge: Over 30 minutes  Debbora Presto, MD  [facility] 10/07/2013, 12:20 PM Pager [phone]  If 7PM-7AM, please contact night-coverage [email or web address] Password [email or web address]

## 2013-10-07 NOTE — Progress Notes (Signed)
Patient ambulated approximately 50 feet in the hallway on room air. Oxygen saturation decreased to 85% on room air. Patient had increased work of breathing.

## 2013-10-08 LAB — LEGIONELLA ANTIGEN, URINE: Legionella Antigen, Urine: NEGATIVE

## 2013-10-08 NOTE — ED Provider Notes (Signed)
Medical screening examination/treatment/procedure(s) were conducted as a shared visit with non-physician practitioner(s) and myself.  I personally evaluated the patient during the encounter.  Patient presents to the ER for evaluation of difficulty breathing. Patient had increased work of breathing, mild hypoxia. She did not significantly improve with bronchodilators and Solu-Medrol here in the ER. X-ray is consistent with pneumonia and therefore patient will require hospitalization.  EKG Interpretation     Ventricular Rate:  118 PR Interval:  109 QRS Duration: 88 QT Interval:  315 QTC Calculation: 441 R Axis:   80 Text Interpretation:  Sinus tachycardia RSR' in V1 or V2, right VCD or RVH No significant change since last tracing other than increased rate              Gilda Crease, MD 10/08/13 [phone]

## 2013-10-12 LAB — CULTURE, BLOOD (ROUTINE X 2)
Culture: NO GROWTH
Culture: NO GROWTH

## 2013-11-20 ENCOUNTER — Encounter (HOSPITAL_BASED_OUTPATIENT_CLINIC_OR_DEPARTMENT_OTHER): Payer: Self-pay | Admitting: Emergency Medicine

## 2013-11-20 ENCOUNTER — Emergency Department (HOSPITAL_BASED_OUTPATIENT_CLINIC_OR_DEPARTMENT_OTHER)
Admission: EM | Admit: 2013-11-20 | Discharge: 2013-11-20 | Disposition: A | Discharge status: Home / Self Care | Payer: No Typology Code available for payment source | Attending: Emergency Medicine | Admitting: Emergency Medicine

## 2013-11-20 ENCOUNTER — Emergency Department (HOSPITAL_BASED_OUTPATIENT_CLINIC_OR_DEPARTMENT_OTHER): Payer: No Typology Code available for payment source

## 2013-11-20 DIAGNOSIS — S52599A Other fractures of lower end of unspecified radius, initial encounter for closed fracture: Secondary | ICD-10-CM | POA: Insufficient documentation

## 2013-11-20 DIAGNOSIS — Z880 Allergy status to penicillin: Secondary | ICD-10-CM | POA: Insufficient documentation

## 2013-11-20 DIAGNOSIS — W010XXA Fall on same level from slipping, tripping and stumbling without subsequent striking against object, initial encounter: Secondary | ICD-10-CM | POA: Insufficient documentation

## 2013-11-20 DIAGNOSIS — S6990XA Unspecified injury of unspecified wrist, hand and finger(s), initial encounter: Secondary | ICD-10-CM | POA: Diagnosis present

## 2013-11-20 DIAGNOSIS — F17200 Nicotine dependence, unspecified, uncomplicated: Secondary | ICD-10-CM | POA: Diagnosis not present

## 2013-11-20 DIAGNOSIS — Y9389 Activity, other specified: Secondary | ICD-10-CM | POA: Insufficient documentation

## 2013-11-20 DIAGNOSIS — J45909 Unspecified asthma, uncomplicated: Secondary | ICD-10-CM | POA: Insufficient documentation

## 2013-11-20 DIAGNOSIS — Z791 Long term (current) use of non-steroidal anti-inflammatories (NSAID): Secondary | ICD-10-CM | POA: Diagnosis not present

## 2013-11-20 DIAGNOSIS — Z872 Personal history of diseases of the skin and subcutaneous tissue: Secondary | ICD-10-CM | POA: Diagnosis not present

## 2013-11-20 DIAGNOSIS — IMO0002 Reserved for concepts with insufficient information to code with codable children: Secondary | ICD-10-CM | POA: Insufficient documentation

## 2013-11-20 DIAGNOSIS — K219 Gastro-esophageal reflux disease without esophagitis: Secondary | ICD-10-CM | POA: Diagnosis not present

## 2013-11-20 DIAGNOSIS — F319 Bipolar disorder, unspecified: Secondary | ICD-10-CM | POA: Insufficient documentation

## 2013-11-20 DIAGNOSIS — S52502A Unspecified fracture of the lower end of left radius, initial encounter for closed fracture: Secondary | ICD-10-CM

## 2013-11-20 DIAGNOSIS — S8992XA Unspecified injury of left lower leg, initial encounter: Secondary | ICD-10-CM

## 2013-11-20 DIAGNOSIS — E039 Hypothyroidism, unspecified: Secondary | ICD-10-CM | POA: Diagnosis not present

## 2013-11-20 DIAGNOSIS — S8000XA Contusion of unspecified knee, initial encounter: Secondary | ICD-10-CM | POA: Diagnosis not present

## 2013-11-20 DIAGNOSIS — Z96659 Presence of unspecified artificial knee joint: Secondary | ICD-10-CM | POA: Insufficient documentation

## 2013-11-20 DIAGNOSIS — S59909A Unspecified injury of unspecified elbow, initial encounter: Secondary | ICD-10-CM | POA: Diagnosis present

## 2013-11-20 DIAGNOSIS — W19XXXA Unspecified fall, initial encounter: Secondary | ICD-10-CM

## 2013-11-20 DIAGNOSIS — Z87448 Personal history of other diseases of urinary system: Secondary | ICD-10-CM | POA: Insufficient documentation

## 2013-11-20 DIAGNOSIS — Y9289 Other specified places as the place of occurrence of the external cause: Secondary | ICD-10-CM | POA: Diagnosis not present

## 2013-11-20 DIAGNOSIS — Z79899 Other long term (current) drug therapy: Secondary | ICD-10-CM | POA: Insufficient documentation

## 2013-11-20 MED ORDER — HYDROMORPHONE HCL PF 1 MG/ML IJ SOLN
1.0000 mg | Freq: Once | INTRAMUSCULAR | Status: AC
  Administered 2013-11-20: 1 mg via INTRAVENOUS
  Filled 2013-11-20: qty 1

## 2013-11-20 NOTE — ED Provider Notes (Signed)
Medical screening examination/treatment/procedure(s) were performed by non-physician practitioner and as supervising physician I was immediately available for consultation/collaboration.  EKG Interpretation   None         Chauna Osoria, MD 11/20/13 1505 

## 2013-11-20 NOTE — ED Notes (Signed)
Patient reports that she slipped and fell in TJ maxx this am. Complains of left wrist pain and right knee pain, no loc.

## 2013-11-20 NOTE — ED Provider Notes (Signed)
CSN: [id number]     Arrival date & time 11/20/13  1148 History   First MD Initiated Contact with Patient 11/20/13 1207     Chief Complaint  Patient presents with  . Fall   (Consider location/radiation/quality/duration/timing/severity/associated sxs/prior Treatment) HPI Comments: Patient is a 42 year old female who presents with left wrist and left knee pain that started after a mechanical fall that occurred prior to arrival in [employer]. Patient reports she tripped and fell, landing on her outstretched left hand and left knee. She denies head trauma or LOC. The pain started immediately and is described as throbbing and severe. The left wrist pain radiates up her left arm and the left knee pain is localized. Movement of the affected joints and weight bearing makes the pain worse. No alleviating factors. Patient has tried applying ice to the injuries without relief. Patient denies any other injury.   Patient is a 42 y.o. female presenting with fall.  Fall Associated symptoms include arthralgias and joint swelling. Pertinent negatives include no abdominal pain, chest pain, chills, fatigue, fever, nausea, neck pain, vomiting or weakness.    Past Medical History  Diagnosis Date  . Interstitial cystitis   . Bipolar 1 disorder   . Acid reflux   . Hypothyroidism   . Hypothyroid   . DDD (degenerative disc disease)   . Psoriasis   . Asthma    Past Surgical History  Procedure Laterality Date  . Total knee arthroplasty    . Foot surgery     No family history on file. History  Substance Use Topics  . Smoking status: Current Every Day Smoker    Types: Cigarettes  . Smokeless tobacco: Never Used  . Alcohol Use: No   OB History   Grav Para Term Preterm Abortions TAB SAB Ect Mult Living                 Review of Systems  Constitutional: Negative for fever, chills and fatigue.  HENT: Negative for trouble swallowing.   Eyes: Negative for visual disturbance.  Respiratory: Negative for  shortness of breath.   Cardiovascular: Negative for chest pain and palpitations.  Gastrointestinal: Negative for nausea, vomiting, abdominal pain and diarrhea.  Genitourinary: Negative for dysuria and difficulty urinating.  Musculoskeletal: Positive for arthralgias and joint swelling. Negative for neck pain.  Skin: Negative for color change.  Neurological: Negative for dizziness and weakness.  Psychiatric/Behavioral: Negative for dysphoric mood.    Allergies  Zyrtec; Benadryl; Chantix; Clindamycin/lincomycin; Doxycycline; Silicone; and Amoxicillin  Home Medications   Current Outpatient Rx  Name  Route  Sig  Dispense  Refill  . LORazepam (ATIVAN) 1 MG tablet   Oral   Take 1 mg by mouth every 8 (eight) hours.         . ABILIFY 15 MG tablet   Oral   Take 15 mg by mouth every evening.          . benzonatate (TESSALON) 200 MG capsule   Oral   Take 1 capsule (200 mg total) by mouth 3 (three) times daily as needed for cough.   60 capsule   1   . desmopressin (DDAVP) 0.2 MG tablet   Oral   Take 0.4 mg by mouth at bedtime.          Marland Kitchen doxepin (SINEQUAN) 25 MG capsule   Oral   Take 25 mg by mouth at bedtime.          . DULoxetine (CYMBALTA) 60 MG capsule  Oral   Take 60 mg by mouth daily.          Marland Kitchen esomeprazole (NEXIUM) 40 MG capsule   Oral   Take 40 mg by mouth daily before breakfast.         . gabapentin (NEURONTIN) 100 MG capsule   Oral   Take 100 mg by mouth 2 (two) times daily. 100mg  in the AM 300 mg in the evening         . ibuprofen (ADVIL,MOTRIN) 200 MG tablet   Oral   Take 400 mg by mouth every 6 (six) hours as needed.         . levalbuterol (XOPENEX) 1.25 MG/0.5ML nebulizer solution   Nebulization   Take 1.25 mg by nebulization every 6 (six) hours as needed for wheezing or shortness of breath.   1 each   12   . levothyroxine (SYNTHROID, LEVOTHROID) 50 MCG tablet   Oral   Take 50 mcg by mouth daily.         . meloxicam (MOBIC) 15 MG  tablet   Oral   Take 15 mg by mouth daily.          Marland Kitchen oxyCODONE-acetaminophen (PERCOCET) 10-325 MG per tablet   Oral   Take 1 tablet by mouth 2 (two) times daily.   65 tablet   0   . oxymorphone (OPANA ER) 15 MG 12 hr tablet   Oral   Take 15 mg by mouth every 12 (twelve) hours.         . temazepam (RESTORIL) 30 MG capsule   Oral   Take 30 mg by mouth at bedtime as needed for sleep.          . VENTOLIN HFA 108 (90 BASE) MCG/ACT inhaler   Inhalation   Inhale 2 puffs into the lungs every 4 (four) hours as needed for wheezing or shortness of breath.           BP 123/71  Pulse 105  Temp(Src) 98.4 F (36.9 C) (Oral)  Resp 18  SpO2 94%  LMP 10/08/2013 Physical Exam  Nursing note and vitals reviewed. Constitutional: She is oriented to person, place, and time. She appears well-developed and well-nourished. No distress.  HENT:  Head: Normocephalic and atraumatic.  Eyes: Conjunctivae and EOM are normal.  Neck: Normal range of motion.  Cardiovascular: Normal rate, regular rhythm and intact distal pulses.  Exam reveals no gallop and no friction rub.   No murmur heard. Sufficient capillary refill of digits of left hand.   Pulmonary/Chest: Effort normal and breath sounds normal. She has no wheezes. She has no rales. She exhibits no tenderness.  Abdominal: Soft. She exhibits no distension. There is no tenderness. There is no rebound and no guarding.  Musculoskeletal:  Left wrist limited ROM due to pain. Generalized edema noted and tenderness to palpation of dorsal wrist. Left snuff box tenderness to palpation. Full ROM of digits of left hand. No obvious deformity.   Left knee anterior localized edema that is suprapatellar. Associated bruising and anterior knee tenderness to palpation. No obvious deformity. ROM limited due to pain.   Neurological: She is alert and oriented to person, place, and time. Coordination normal.  Sensation intact distal to injuries. Speech is  goal-oriented. Moves limbs without ataxia.   Skin: Skin is warm and dry.  Psychiatric: She has a normal mood and affect. Her behavior is normal.    ED Course  Procedures (including critical care time)  SPLINT APPLICATION Date/Time: 04/08/2013  3:38 PM Authorized by: Emilia Beck Consent: Verbal consent obtained. Risks and benefits: risks, benefits and alternatives were discussed Consent given by: patient Splint applied by: EMT Location details: left wrist Splint type: Velcro thumb spica Supplies used: n/a Post-procedure: The splinted body part was neurovascularly unchanged following the procedure. Patient tolerance: Patient tolerated the procedure well with no immediate complications.     Labs Review Labs Reviewed - No data to display Imaging Review Dg Wrist Complete Left  11/20/2013   CLINICAL DATA:  Fall.  EXAM: LEFT WRIST - COMPLETE 3+ VIEW  COMPARISON:  Left wrist MRI 05/23/2004.  FINDINGS: Subtle lucency is noted along the ulnar aspect of the distal left radial metaphysis. A subtle fracture cannot be excluded. No evidence of a displaced fracture. Ulnar styloid is intact. Carpus is intact.  IMPRESSION: Cannot exclude subtle nondisplaced fracture of the ulnar aspect of the distal left radial metaphysis .   Electronically Signed   By: Maisie Fus  Register   On: 11/20/2013 12:46   Dg Knee Complete 4 Views Right  11/20/2013   CLINICAL DATA:  Fall.  EXAM: RIGHT KNEE - COMPLETE 4+ VIEW  COMPARISON:  None.  FINDINGS: Patient's head prior total knee replacement. Prostheses appear well seated. No acute abnormality identified. No evidence of fracture. No knee joint effusion. Small bony densities noted about the joint space may be related to prior surgery and/or tiny loose bodies.  IMPRESSION: Prior total knee replacement.  No acute abnormality .   Electronically Signed   By: Maisie Fus  Register   On: 11/20/2013 12:48    EKG Interpretation   None       MDM   1. Fall, initial encounter    2. Distal radius fracture, left, closed, initial encounter   3. Left knee injury, initial encounter     1:17 PM Patient presents after a FOOSH that occurred prior to arrival. Patient's wrist xray shows possible nondisplaced fracture of distal left radial metaphysis, which clinically correlates with the patient's pain after the fall. Knee image unremarkable for acute changes. Patient also has snuff box tenderness on the left hand. Patient will have thumb spica velcro splint for possible scaphoid fracture and radial fracture. Patient will have IM dilaudid here and no prescription pain medication due to chronic pain patient. Pain control discussed with patient and she has reasonable expectations and understands she cannot have pain medication prescriptions. Patient instructed to ice, elevate and rest injured joints. Patient will follow up with hand surgeon per recommendation. No neurovascular compromise.    Emilia Beck, PA-C 11/20/13 1339

## 2013-12-09 ENCOUNTER — Encounter: Payer: Self-pay | Admitting: Internal Medicine

## 2013-12-09 ENCOUNTER — Ambulatory Visit (INDEPENDENT_AMBULATORY_CARE_PROVIDER_SITE_OTHER)
Admission: RE | Admit: 2013-12-09 | Discharge: 2013-12-09 | Disposition: A | Discharge status: Home / Self Care | Payer: Medicare Other | Source: Ambulatory Visit | Attending: Internal Medicine | Admitting: Internal Medicine

## 2013-12-09 ENCOUNTER — Other Ambulatory Visit: Payer: Self-pay | Admitting: Internal Medicine

## 2013-12-09 ENCOUNTER — Ambulatory Visit (INDEPENDENT_AMBULATORY_CARE_PROVIDER_SITE_OTHER): Payer: Medicare Other | Admitting: Internal Medicine

## 2013-12-09 ENCOUNTER — Encounter (INDEPENDENT_AMBULATORY_CARE_PROVIDER_SITE_OTHER): Payer: Self-pay

## 2013-12-09 VITALS — BP 110/82 | HR 90 | Ht <= 58 in | Wt 136.0 lb

## 2013-12-09 DIAGNOSIS — J8410 Pulmonary fibrosis, unspecified: Secondary | ICD-10-CM

## 2013-12-09 DIAGNOSIS — F17200 Nicotine dependence, unspecified, uncomplicated: Secondary | ICD-10-CM

## 2013-12-09 NOTE — Progress Notes (Signed)
Subjective:    Patient ID: [name], female    DOB: [birth year] MRN: [id number]  HPI  43 yowf smoker with nl pfts 03/2010 perfomred to clear her for mtx rx for psoriasis never took it referred back by [facility] for ? Copd  Nov 2014 Pna > [facility]  Dec 2014 > [facility] pna dx (cxr report suggest bilateral ILD)  12/09/2013 1st  Pulmonary office visit/ EPIC era [name] cc . Back to baseline p rx for pna Chief Complaint  Patient presents with  . Pulmonary Consult    referred by Donnella Bi, FNP for COPD   Not limited from desired activities Cough sporadic  has combivent not using  No obvious day to day or daytime variabilty or assoc cp or chest tightness, subjective wheeze overt sinus or hb symptoms. No unusual exp hx or h/o childhood pna/ asthma or knowledge of premature birth.  Sleeping ok without nocturnal  or early am exacerbation  of respiratory  c/o's or need for noct saba. Also denies any obvious fluctuation of symptoms with weather or environmental changes or other aggravating or alleviating factors except as outlined above   Current Medications, Allergies, Complete Past Medical History, Past Surgical History, Family History, and Social History were reviewed in [employer] [facility] record.    Past Medical History:  LUPUS (ICD-710.0)  - Walking sats = 94 -91 rapid walk x 3 laps Apr 17, 2010  ASTHMA (ICD-493.90)  - PFT's wnl Apr 17, 2010 no dlco  PSORIASIS (ICD-696.1)    Social History:  Current smoker since age 43. Smokes 1/2 ppd.              Review of Systems  Constitutional: Negative for fever, chills, diaphoresis, activity change, appetite change, fatigue and unexpected weight change.  HENT: Positive for dental problem. Negative for congestion, ear discharge, ear pain, facial swelling, hearing loss, mouth sores, nosebleeds, postnasal drip, rhinorrhea, sinus pressure, sneezing, sore throat, tinnitus, trouble swallowing and voice change.    Eyes: Negative for photophobia, discharge, itching and visual disturbance.  Respiratory: Positive for cough and shortness of breath. Negative for apnea, choking, chest tightness, wheezing and stridor.   Cardiovascular: Negative for chest pain, palpitations and leg swelling.  Gastrointestinal: Negative for nausea, vomiting, abdominal pain, constipation, blood in stool and abdominal distention.       Acid Heartburn Indigestion  Genitourinary: Negative for dysuria, urgency, frequency, hematuria, flank pain, decreased urine volume and difficulty urinating.  Musculoskeletal: Negative for arthralgias, back pain, gait problem, joint swelling, myalgias, neck pain and neck stiffness.  Skin: Negative for color change, pallor and rash.  Neurological: Negative for dizziness, tremors, seizures, syncope, speech difficulty, weakness, light-headedness, numbness and headaches.  Hematological: Negative for adenopathy. Does not bruise/bleed easily.  Psychiatric/Behavioral: Positive for dysphoric mood. Negative for confusion, sleep disturbance and agitation. The patient is nervous/anxious.        Objective:   Physical Exam  Wt Readings from Last 3 Encounters:  12/09/13 136 lb (61.689 kg)  10/05/13 130 lb 4.7 oz (59.1 kg)  04/17/10 115 lb (52.164 kg)      Physical Exam  Additional Exam: amb wf nad  wt 115 > 115 Apr 17, 2010 > 136 12/09/2013  HEENT: nl dentition, turbinates, and orophanx. Nl external ear canals without cough reflex  Neck without JVD/Nodes/TM  Lungs clear to A and P bilaterally without cough on insp or exp maneuvers  RRR no s3 or murmur or increase in P2  Abd soft and  benign with nl excursion in the supine position. No bruits or organomegaly  Ext warm without calf tenderness, cyanosis clubbing or edema  Skin warm and dry without lesions    CXR  12/09/2013 : No active cardiopulmonary disease.       Assessment & Plan:

## 2013-12-09 NOTE — Patient Instructions (Addendum)
Please remember to go to the x-ray department downstairs for your tests - we will call you with the results when they are available.  The key is to stop smoking completely before smoking completely stops you!   Please schedule a follow up office visit in 6 weeks, call sooner if needed with pfts bring combivent with you

## 2013-12-10 NOTE — Assessment & Plan Note (Signed)
I took an extended  opportunity with this patient to outline the consequences of continued cigarette use  in airway disorders based on all the data we have from the multiple national lung health studies (perfomed over decades at millions of dollars in cost)  indicating that smoking cessation, not choice of inhalers or physicians, is the most important aspect of her  care.

## 2013-12-10 NOTE — Assessment & Plan Note (Signed)
Not clear there is any sign residual ILD either from the recent "pna" or from SLE or from MTX at this point but she has not symptoms so the main point here is to prevent recurrence or progression of lung dz related to smoking (discussed separately)  Will bring her back for PFT's and in meantime just ask her to use combivent prn.

## 2014-01-20 ENCOUNTER — Encounter: Payer: Self-pay | Admitting: Internal Medicine

## 2014-01-20 ENCOUNTER — Ambulatory Visit (INDEPENDENT_AMBULATORY_CARE_PROVIDER_SITE_OTHER): Payer: Medicare Other | Admitting: Internal Medicine

## 2014-01-20 VITALS — BP 122/80 | HR 80 | Temp 98.0°F | Ht <= 58 in | Wt 143.0 lb

## 2014-01-20 DIAGNOSIS — J8410 Pulmonary fibrosis, unspecified: Secondary | ICD-10-CM

## 2014-01-20 DIAGNOSIS — F17200 Nicotine dependence, unspecified, uncomplicated: Secondary | ICD-10-CM

## 2014-01-20 LAB — PULMONARY FUNCTION TEST
DL/VA % PRED: 91 %
DL/VA: 3.57 ml/min/mmHg/L
DLCO unc % pred: 68 %
DLCO unc: 11.11 ml/min/mmHg
FEF 25-75 Post: 3.21 L/sec
FEF 25-75 Pre: 2.33 L/sec
FEF2575-%CHANGE-POST: 37 %
FEF2575-%PRED-POST: 121 %
FEF2575-%PRED-PRE: 88 %
FEV1-%Change-Post: 7 %
FEV1-%PRED-POST: 79 %
FEV1-%Pred-Pre: 73 %
FEV1-POST: 1.89 L
FEV1-PRE: 1.75 L
FEV1FVC-%Change-Post: 6 %
FEV1FVC-%PRED-PRE: 106 %
FEV6-%Change-Post: 1 %
FEV6-%Pred-Post: 71 %
FEV6-%Pred-Pre: 70 %
FEV6-POST: 2.06 L
FEV6-Pre: 2.03 L
FEV6FVC-%Pred-Post: 102 %
FEV6FVC-%Pred-Pre: 102 %
FVC-%CHANGE-POST: 1 %
FVC-%PRED-PRE: 69 %
FVC-%Pred-Post: 70 %
FVC-Post: 2.06 L
FVC-Pre: 2.03 L
PRE FEV1/FVC RATIO: 86 %
PRE FEV6/FVC RATIO: 100 %
Post FEV1/FVC ratio: 92 %
Post FEV6/FVC ratio: 100 %
RV % pred: 72 %
RV: 1 L
TLC % pred: 80 %
TLC: 3.35 L

## 2014-01-20 NOTE — Patient Instructions (Signed)
Ok to use combivent up to 4 x daily when sick but if needing to use it more than twice daily on a regular basis we need to see you back here asap  The key is to stop smoking completely before smoking completely stops you!  Pulmonary follow up is as needed

## 2014-01-20 NOTE — Progress Notes (Signed)
PFT done today. 

## 2014-01-20 NOTE — Progress Notes (Signed)
Subjective:    Patient ID: [name], female    DOB: Dec 03, 1970 MRN: [id number]    Brief patient profile:  43 yowf smoker with nl pfts 03/2010 performed  to clear her for mtx rx for psoriasis never took it referred back by [facility] for ? Copd   History of Present Illness  Nov 2014 Pna > [facility]  Dec 2014 > [facility] pna dx (cxr report suggest bilateral ILD)  12/09/2013 1st [facility] office visit/ [facility] era [name] cc . Back to baseline p rx for pna Chief Complaint  Patient presents with  . Pulmonary Consult    referred by Donnella Bi, FNP for COPD  Not limited from desired activities Cough sporadic  has combivent not using rec Stop smoking and use combivent prn   01/20/2014 f/u ov/[name] re: ? Copd > not present on pfts  Chief Complaint  Patient presents with  . Followup with PFT    Pt states cough and SOB have improved. No new co's today.   Not limited by breathing from desired activities     No obvious day to day or daytime variabilty or assoc   cp or chest tightness, subjective wheeze overt sinus or hb symptoms. No unusual exp hx or h/o childhood pna/ asthma or knowledge of premature birth.  Sleeping ok without nocturnal  or early am exacerbation  of respiratory  c/o's or need for noct saba. Also denies any obvious fluctuation of symptoms with weather or environmental changes or other aggravating or alleviating factors except as outlined above   Current Medications, Allergies, Complete Past Medical History, Past Surgical History, Family History, and Social History were reviewed in [employer] record.  ROS  The following are not active complaints unless bolded sore throat, dysphagia, dental problems, itching, sneezing,  nasal congestion or excess/ purulent secretions, ear ache,   fever, chills, sweats, unintended wt loss, pleuritic or exertional cp, hemoptysis,  orthopnea pnd or leg swelling, presyncope, palpitations, heartburn, abdominal pain,  anorexia, nausea, vomiting, diarrhea  or change in bowel or urinary habits, change in stools or urine, dysuria,hematuria,  rash, arthralgias, visual complaints, headache, numbness weakness or ataxia or problems with walking or coordination,  change in mood/affect or memory.         Past Medical History:  LUPUS (ICD-710.0)  - Walking sats = 94 -91 rapid walk x 3 laps Apr 17, 2010  ASTHMA (ICD-493.90)  - PFT's wnl Apr 17, 2010 no dlco  PSORIASIS (ICD-696.1)    Social History:  Current smoker since age 43. Smokes 1/2 ppd.           Objective:   Physical Exam   01/20/2014      143  Wt Readings from Last 3 Encounters:  12/09/13 136 lb (61.689 kg)  10/05/13 130 lb 4.7 oz (59.1 kg)  04/17/10 115 lb (52.164 kg)      Physical Exam  Additional Exam: amb wf nad  wt 115 > 115 Apr 17, 2010 > 136 12/09/2013>  01/20/14  143  HEENT: nl dentition, turbinates, and orophanx. Nl external ear canals without cough reflex  Neck without JVD/Nodes/TM  Lungs clear to A and P bilaterally without cough on insp or exp maneuvers  RRR no s3 or murmur or increase in P2  Abd soft and benign with nl excursion in the supine position. No bruits or organomegaly  Ext warm without calf tenderness, cyanosis clubbing or edema  Skin warm and dry without lesions  CXR  12/09/2013 : No active cardiopulmonary disease.       Assessment & Plan:

## 2014-01-21 ENCOUNTER — Encounter: Payer: Self-pay | Admitting: Internal Medicine

## 2014-01-21 NOTE — Assessment & Plan Note (Signed)
I reviewed the Flethcher curve with patient that basically indicates  if you quit smoking when your best day FEV1 is still well preserved (as hers clearly is)  it is highly unlikely you will progress to severe disease and informed the patient there was no medication on the market that has proven to change the curve or the likelihood of progression.  Therefore stopping smoking and maintaining abstinence is the most important aspect of care, not choice of inhalers or for that matter, doctors.

## 2014-01-21 NOTE — Assessment & Plan Note (Addendum)
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   Pulmonary f/u can be prn

## 2014-01-29 ENCOUNTER — Emergency Department (HOSPITAL_BASED_OUTPATIENT_CLINIC_OR_DEPARTMENT_OTHER): Payer: Medicare Other

## 2014-01-29 ENCOUNTER — Emergency Department (HOSPITAL_BASED_OUTPATIENT_CLINIC_OR_DEPARTMENT_OTHER)
Admission: EM | Admit: 2014-01-29 | Discharge: 2014-01-29 | Disposition: A | Discharge status: Home / Self Care | Payer: Medicare Other | Attending: Emergency Medicine | Admitting: Emergency Medicine

## 2014-01-29 ENCOUNTER — Encounter (HOSPITAL_BASED_OUTPATIENT_CLINIC_OR_DEPARTMENT_OTHER): Payer: Self-pay | Admitting: Emergency Medicine

## 2014-01-29 DIAGNOSIS — K219 Gastro-esophageal reflux disease without esophagitis: Secondary | ICD-10-CM | POA: Insufficient documentation

## 2014-01-29 DIAGNOSIS — Y9389 Activity, other specified: Secondary | ICD-10-CM | POA: Insufficient documentation

## 2014-01-29 DIAGNOSIS — F319 Bipolar disorder, unspecified: Secondary | ICD-10-CM | POA: Insufficient documentation

## 2014-01-29 DIAGNOSIS — Z880 Allergy status to penicillin: Secondary | ICD-10-CM | POA: Insufficient documentation

## 2014-01-29 DIAGNOSIS — Y9289 Other specified places as the place of occurrence of the external cause: Secondary | ICD-10-CM | POA: Insufficient documentation

## 2014-01-29 DIAGNOSIS — Z872 Personal history of diseases of the skin and subcutaneous tissue: Secondary | ICD-10-CM | POA: Insufficient documentation

## 2014-01-29 DIAGNOSIS — S99919A Unspecified injury of unspecified ankle, initial encounter: Secondary | ICD-10-CM

## 2014-01-29 DIAGNOSIS — S8990XA Unspecified injury of unspecified lower leg, initial encounter: Secondary | ICD-10-CM | POA: Insufficient documentation

## 2014-01-29 DIAGNOSIS — E039 Hypothyroidism, unspecified: Secondary | ICD-10-CM | POA: Insufficient documentation

## 2014-01-29 DIAGNOSIS — S99929A Unspecified injury of unspecified foot, initial encounter: Secondary | ICD-10-CM

## 2014-01-29 DIAGNOSIS — IMO0002 Reserved for concepts with insufficient information to code with codable children: Secondary | ICD-10-CM | POA: Insufficient documentation

## 2014-01-29 DIAGNOSIS — S8992XA Unspecified injury of left lower leg, initial encounter: Secondary | ICD-10-CM

## 2014-01-29 DIAGNOSIS — Z791 Long term (current) use of non-steroidal anti-inflammatories (NSAID): Secondary | ICD-10-CM | POA: Insufficient documentation

## 2014-01-29 DIAGNOSIS — Z96659 Presence of unspecified artificial knee joint: Secondary | ICD-10-CM | POA: Insufficient documentation

## 2014-01-29 DIAGNOSIS — Z87448 Personal history of other diseases of urinary system: Secondary | ICD-10-CM | POA: Insufficient documentation

## 2014-01-29 DIAGNOSIS — W010XXA Fall on same level from slipping, tripping and stumbling without subsequent striking against object, initial encounter: Secondary | ICD-10-CM | POA: Insufficient documentation

## 2014-01-29 DIAGNOSIS — J45909 Unspecified asthma, uncomplicated: Secondary | ICD-10-CM | POA: Insufficient documentation

## 2014-01-29 DIAGNOSIS — S62609A Fracture of unspecified phalanx of unspecified finger, initial encounter for closed fracture: Secondary | ICD-10-CM

## 2014-01-29 DIAGNOSIS — Z79899 Other long term (current) drug therapy: Secondary | ICD-10-CM | POA: Insufficient documentation

## 2014-01-29 DIAGNOSIS — F17200 Nicotine dependence, unspecified, uncomplicated: Secondary | ICD-10-CM | POA: Insufficient documentation

## 2014-01-29 MED ORDER — OXYCODONE-ACETAMINOPHEN 5-325 MG PO TABS
1.0000 | ORAL_TABLET | Freq: Once | ORAL | Status: AC
  Administered 2014-01-29: 1 via ORAL
  Filled 2014-01-29: qty 1

## 2014-01-29 NOTE — ED Provider Notes (Signed)
CSN: [id number]     Arrival date & time 01/29/14  1037 History   First MD Initiated Contact with Patient 01/29/14 1152     Chief Complaint  Patient presents with  . Fall     (Consider location/radiation/quality/duration/timing/severity/associated sxs/prior Treatment) Patient is a 43 y.o. female presenting with fall. The history is provided by the patient.  Fall This is a new problem. The current episode started today. The problem has been unchanged.   [name] is a 43 y.o. female who presents to the ED with pain in her right hand and left knee s/p fall this morning approximately 7am. She had been outside with her dog and was on her way back in and slipped and fell on ice.  She does not think she hit her head and denies LOC. She fell on her left knee and right hand. Her little finger on the right hand is deformed and painful. She has a knee replacement of the left knee and complains of pain with ambulation. She denies nausea, vomiting, abdominal pain or other injuries.  Past Medical History  Diagnosis Date  . Interstitial cystitis   . Bipolar 1 disorder   . Acid reflux   . Hypothyroidism   . Hypothyroid   . DDD (degenerative disc disease)   . Psoriasis   . Asthma    Past Surgical History  Procedure Laterality Date  . Total knee arthroplasty    . Foot surgery     No family history on file. History  Substance Use Topics  . Smoking status: Current Every Day Smoker -- 1.00 packs/day for 29 years    Types: Cigarettes  . Smokeless tobacco: Never Used  . Alcohol Use: No   OB History   Grav Para Term Preterm Abortions TAB SAB Ect Mult Living                 Review of Systems Negative except as stated in HPI   Allergies  Zyrtec; Benadryl; Chantix; Claritin; Clindamycin/lincomycin; Doxycycline; Silicone; and Amoxicillin  Home Medications   Current Outpatient Rx  Name  Route  Sig  Dispense  Refill  . ABILIFY 15 MG tablet   Oral   Take 15 mg by mouth every  evening.          Marland Kitchen albuterol-ipratropium (COMBIVENT) 18-103 MCG/ACT inhaler   Inhalation   Inhale 2 puffs into the lungs every 6 (six) hours as needed for wheezing or shortness of breath.         . desmopressin (DDAVP) 0.2 MG tablet   Oral   Take 0.4 mg by mouth at bedtime.          Marland Kitchen doxepin (SINEQUAN) 25 MG capsule   Oral   Take 25 mg by mouth at bedtime.          Marland Kitchen escitalopram (LEXAPRO) 20 MG tablet   Oral   Take 20 mg by mouth daily.         Marland Kitchen esomeprazole (NEXIUM) 40 MG capsule   Oral   Take 40 mg by mouth daily before breakfast.         . gabapentin (NEURONTIN) 100 MG capsule      100mg  in the AM 300 mg in the evening         . ibuprofen (ADVIL,MOTRIN) 200 MG tablet   Oral   Take 400 mg by mouth every 6 (six) hours as needed.         Marland Kitchen levothyroxine (SYNTHROID, LEVOTHROID) 50  MCG tablet   Oral   Take 50 mcg by mouth daily.         Marland Kitchen LORazepam (ATIVAN) 1 MG tablet   Oral   Take 1 mg by mouth every 8 (eight) hours.         . meloxicam (MOBIC) 15 MG tablet   Oral   Take 15 mg by mouth daily.          Marland Kitchen oxyCODONE-acetaminophen (PERCOCET) 10-325 MG per tablet   Oral   Take 1 tablet by mouth 2 (two) times daily.   65 tablet   0   . oxymorphone (OPANA ER) 15 MG 12 hr tablet   Oral   Take 15 mg by mouth every 12 (twelve) hours.         . temazepam (RESTORIL) 30 MG capsule   Oral   Take 60 mg by mouth at bedtime as needed for sleep.           BP 125/81  Pulse 97  Temp(Src) 98.9 F (37.2 C) (Oral)  Resp 18  SpO2 98% Physical Exam  Nursing note and vitals reviewed. Constitutional: She is oriented to person, place, and time. She appears well-developed and well-nourished.  HENT:  Head: Normocephalic and atraumatic.  Right Ear: Tympanic membrane normal.  Left Ear: Tympanic membrane normal.  Nose: Nose normal.  Mouth/Throat: Uvula is midline, oropharynx is clear and moist and mucous membranes are normal.  Eyes: Conjunctivae  and EOM are normal. Pupils are equal, round, and reactive to light.  Neck: Neck supple.  Cardiovascular: Normal rate, regular rhythm and normal heart sounds.   Pulmonary/Chest: Effort normal. She has no wheezes. She has no rales.  Abdominal: Soft. Bowel sounds are normal. There is no tenderness. There is no CVA tenderness.  Musculoskeletal:       Left knee: She exhibits swelling. She exhibits normal range of motion, no laceration, no erythema and normal alignment. Tenderness found. LCL tenderness noted.       Right hand: She exhibits deformity and swelling. She exhibits normal capillary refill and no laceration. Normal sensation noted.       Hands: Tenderness, swelling and deformity noted to 5th digit of the right hand.   Neurological: She is alert and oriented to person, place, and time. No cranial nerve deficit.  Skin: Skin is warm and dry.  Psychiatric: She has a normal mood and affect. Her behavior is normal.    ED Course  Procedures (including critical care time) Labs Review Labs Reviewed - No data to display Imaging Review Dg Knee Complete 4 Views Left  01/29/2014   CLINICAL DATA:  Fall on ice, left knee pain  EXAM: LEFT KNEE - COMPLETE 4+ VIEW  COMPARISON:  None.  FINDINGS: Four views of the left knee submitted. There is left knee prosthesis in anatomic alignment. No acute fracture or subluxation. No evidence of prosthesis loosening. Small joint effusion.  IMPRESSION: No acute fracture or subluxation. Left knee prosthesis in anatomic alignment. Small joint effusion.   Electronically Signed   By: Lahoma Crocker M.D.   On: 01/29/2014 11:19   Dg Hand Complete Right  01/29/2014   CLINICAL DATA:  Fall on ice  EXAM: RIGHT HAND - COMPLETE 3+ VIEW  COMPARISON:  07/09/2012  FINDINGS: Three views of the right hand submitted. There is displaced slight comminuted fracture at the base of proximal phalanx fifth finger. Subtle fracture line is extending in articular surface.  IMPRESSION: Displaced slight  comminuted fracture at the base  of proximal phalanx fifth finger.   Electronically Signed   By: Lahoma Crocker M.D.   On: 01/29/2014 11:21   MDM: discussed with Dr. Amedeo Plenty. He offered patient to see in the [facility] ED or splint and see in the office tomorrow and schedule surgery for Tuesday. Patient will go to the office at 1pm tomorrow.   43 y.o. female with fracture of the right 5th digit of the hand s/p fall and left knee pain. Stable for discharge with adequate circulation. No signs of compartment syndrome at this time. Patient placed in ulnar gutter splint, ice and elevation. She will also elevate her knee and apply ice. She will follow up with Dr. Amedeo Plenty and plan for surgery on her finger. I have reviewed this patient's vital signs, nurses notes, appropriate labs and imaging.  I have discussed findings and plan of care with the patient and she voices understanding. She states she has pain medication at home and NSAID's. She will return here as needed.    Medication List    ASK your doctor about these medications       ABILIFY 15 MG tablet  Generic drug:  ARIPiprazole  Take 15 mg by mouth every evening.     albuterol-ipratropium 18-103 MCG/ACT inhaler  Commonly known as:  COMBIVENT  Inhale 2 puffs into the lungs every 6 (six) hours as needed for wheezing or shortness of breath.     desmopressin 0.2 MG tablet  Commonly known as:  DDAVP  Take 0.4 mg by mouth at bedtime.     doxepin 25 MG capsule  Commonly known as:  SINEQUAN  Take 25 mg by mouth at bedtime.     escitalopram 20 MG tablet  Commonly known as:  LEXAPRO  Take 20 mg by mouth daily.     esomeprazole 40 MG capsule  Commonly known as:  NEXIUM  Take 40 mg by mouth daily before breakfast.     gabapentin 100 MG capsule  Commonly known as:  NEURONTIN  - 100mg  in the AM  - 300 mg in the evening     ibuprofen 200 MG tablet  Commonly known as:  ADVIL,MOTRIN  Take 400 mg by mouth every 6 (six) hours as needed.     levothyroxine  50 MCG tablet  Commonly known as:  SYNTHROID, LEVOTHROID  Take 50 mcg by mouth daily.     LORazepam 1 MG tablet  Commonly known as:  ATIVAN  Take 1 mg by mouth every 8 (eight) hours.     meloxicam 15 MG tablet  Commonly known as:  MOBIC  Take 15 mg by mouth daily.     oxyCODONE-acetaminophen 10-325 MG per tablet  Commonly known as:  PERCOCET  Take 1 tablet by mouth 2 (two) times daily.     oxymorphone 15 MG 12 hr tablet  Commonly known as:  OPANA ER  Take 15 mg by mouth every 12 (twelve) hours.     temazepam 30 MG capsule  Commonly known as:  RESTORIL  Take 60 mg by mouth at bedtime as needed for sleep.           [place], [place] 01/29/14 1343

## 2014-01-29 NOTE — ED Provider Notes (Signed)
Medical screening examination/treatment/procedure(s) were performed by non-physician practitioner and as supervising physician I was immediately available for consultation/collaboration.    Dot Lanes, MD 01/29/14 [phone]

## 2014-01-29 NOTE — ED Notes (Signed)
Patient reports that she slipped outside this am on ice. Complains of left knee pain that is a replacement and right hand pain, reports that her 5th digit hurts the worse, with deformity

## 2014-01-29 NOTE — Discharge Instructions (Signed)
Go to Dr. Vanetta Shawl office tomorrow at 1pm. Elevate the area, apply ice and take your pain medication as needed.   Cast or Splint Care Casts and splints support injured limbs and keep bones from moving while they heal. It is important to care for your cast or splint at home.  HOME CARE INSTRUCTIONS  Keep the cast or splint uncovered during the drying period. It can take 24 to 48 hours to dry if it is made of plaster. A fiberglass cast will dry in less than 1 hour.  Do not rest the cast on anything harder than a pillow for the first 24 hours.  Do not put weight on your injured limb or apply pressure to the cast until your health care provider gives you permission.  Keep the cast or splint dry. Wet casts or splints can lose their shape and may not support the limb as well. A wet cast that has lost its shape can also create harmful pressure on your skin when it dries. Also, wet skin can become infected.  Cover the cast or splint with a plastic bag when bathing or when out in the rain or snow. If the cast is on the trunk of the body, take sponge baths until the cast is removed.  If your cast does become wet, dry it with a towel or a blow dryer on the cool setting only.  Keep your cast or splint clean. Soiled casts may be wiped with a moistened cloth.  Do not place any hard or soft foreign objects under your cast or splint, such as cotton, toilet paper, lotion, or powder.  Do not try to scratch the skin under the cast with any object. The object could get stuck inside the cast. Also, scratching could lead to an infection. If itching is a problem, use a blow dryer on a cool setting to relieve discomfort.  Do not trim or cut your cast or remove padding from inside of it.  Exercise all joints next to the injury that are not immobilized by the cast or splint. For example, if you have a long leg cast, exercise the hip joint and toes. If you have an arm cast or splint, exercise the shoulder, elbow,  thumb, and fingers.  Elevate your injured arm or leg on 1 or 2 pillows for the first 1 to 3 days to decrease swelling and pain.It is best if you can comfortably elevate your cast so it is higher than your heart. SEEK MEDICAL CARE IF:   Your cast or splint cracks.  Your cast or splint is too tight or too loose.  You have unbearable itching inside the cast.  Your cast becomes wet or develops a soft spot or area.  You have a bad smell coming from inside your cast.  You get an object stuck under your cast.  Your skin around the cast becomes red or raw.  You have new pain or worsening pain after the cast has been applied. SEEK IMMEDIATE MEDICAL CARE IF:   You have fluid leaking through the cast.  You are unable to move your fingers or toes.  You have discolored (blue or white), cool, painful, or very swollen fingers or toes beyond the cast.  You have tingling or numbness around the injured area.  You have severe pain or pressure under the cast.  You have any difficulty with your breathing or have shortness of breath.  You have chest pain. Document Released: 11/14/2000 Document Revised: 09/07/2013 Document Reviewed:  05/26/2013 [employer] Patient Information 2014 [place].  Finger Fracture Fractures of fingers are breaks in the bones of the fingers. There are many types of fractures. There are different ways of treating these fractures. Your health care provider will discuss the best way to treat your fracture. CAUSES Traumatic injury is the main cause of broken fingers. These include:  Injuries while playing sports.  Workplace injuries.  Falls. RISK FACTORS Activities that can increase your risk of finger fractures include:  Sports.  Workplace activities that involve machinery.  A condition called osteoporosis, which can make your bones less dense and cause them to fracture more easily. SIGNS AND SYMPTOMS The main symptoms of a broken finger are pain and  swelling within 15 minutes after the injury. Other symptoms include:  Bruising of your finger.  Stiffness of your finger.  Numbness of your finger.  Exposed bones (compound fracture) if the fracture is severe. DIAGNOSIS  The best way to diagnose a broken bone is with X-ray imaging. Additionally, your health care provider will use this X-ray image to evaluate the position of the broken finger bones.  TREATMENT  Finger fractures can be treated with:   Nonreduction This means the bones are in place. The finger is splinted without changing the positions of the bone pieces. The splint is usually left on for about a week to 10 days. This will depend on your fracture and what your health care provider thinks.  Closed reduction The bones are put back into position without using surgery. The finger is then splinted.  Open reduction and internal fixation The fracture site is opened. Then the bone pieces are fixed into place with pins or some type of hardware. This is seldom required. It depends on the severity of the fracture. HOME CARE INSTRUCTIONS   Follow your health care provider's instructions regarding activities, exercises, and physical therapy.  Only take over-the-counter or prescription medicines for pain, discomfort, or fever as directed by your health care provider. SEEK MEDICAL CARE IF: You have pain or swelling that limits the motion or use of your fingers. SEEK IMMEDIATE MEDICAL CARE IF:  Your finger becomes numb. MAKE SURE YOU:   Understand these instructions.  Will watch your condition.  Will get help right away if you are not doing well or get worse. Document Released: 03/01/2001 Document Revised: 09/07/2013 Document Reviewed: 06/29/2013 [facility] Patient Information 2014 [place], [place].

## 2014-01-29 NOTE — ED Notes (Signed)
I gave patient a large/small ice pack for her wrist/knee injuries.

## 2014-04-02 ENCOUNTER — Emergency Department (HOSPITAL_COMMUNITY)
Admission: EM | Admit: 2014-04-02 | Discharge: 2014-04-02 | Disposition: A | Discharge status: Home / Self Care | Payer: Medicare Other | Attending: Emergency Medicine | Admitting: Emergency Medicine

## 2014-04-02 ENCOUNTER — Encounter (HOSPITAL_COMMUNITY): Payer: Self-pay | Admitting: Emergency Medicine

## 2014-04-02 ENCOUNTER — Emergency Department (HOSPITAL_COMMUNITY): Payer: Medicare Other

## 2014-04-02 DIAGNOSIS — K219 Gastro-esophageal reflux disease without esophagitis: Secondary | ICD-10-CM | POA: Insufficient documentation

## 2014-04-02 DIAGNOSIS — F17200 Nicotine dependence, unspecified, uncomplicated: Secondary | ICD-10-CM | POA: Insufficient documentation

## 2014-04-02 DIAGNOSIS — J45909 Unspecified asthma, uncomplicated: Secondary | ICD-10-CM | POA: Insufficient documentation

## 2014-04-02 DIAGNOSIS — F319 Bipolar disorder, unspecified: Secondary | ICD-10-CM | POA: Insufficient documentation

## 2014-04-02 DIAGNOSIS — Z87448 Personal history of other diseases of urinary system: Secondary | ICD-10-CM | POA: Insufficient documentation

## 2014-04-02 DIAGNOSIS — K7689 Other specified diseases of liver: Secondary | ICD-10-CM | POA: Insufficient documentation

## 2014-04-02 DIAGNOSIS — Z79899 Other long term (current) drug therapy: Secondary | ICD-10-CM | POA: Insufficient documentation

## 2014-04-02 DIAGNOSIS — Z791 Long term (current) use of non-steroidal anti-inflammatories (NSAID): Secondary | ICD-10-CM | POA: Insufficient documentation

## 2014-04-02 DIAGNOSIS — Z872 Personal history of diseases of the skin and subcutaneous tissue: Secondary | ICD-10-CM | POA: Insufficient documentation

## 2014-04-02 DIAGNOSIS — Z880 Allergy status to penicillin: Secondary | ICD-10-CM | POA: Insufficient documentation

## 2014-04-02 DIAGNOSIS — K760 Fatty (change of) liver, not elsewhere classified: Secondary | ICD-10-CM

## 2014-04-02 DIAGNOSIS — E039 Hypothyroidism, unspecified: Secondary | ICD-10-CM | POA: Insufficient documentation

## 2014-04-02 DIAGNOSIS — Z8739 Personal history of other diseases of the musculoskeletal system and connective tissue: Secondary | ICD-10-CM | POA: Insufficient documentation

## 2014-04-02 DIAGNOSIS — R197 Diarrhea, unspecified: Secondary | ICD-10-CM | POA: Insufficient documentation

## 2014-04-02 LAB — I-STAT TROPONIN, ED: Troponin i, poc: 0 ng/mL (ref 0.00–0.08)

## 2014-04-02 LAB — COMPREHENSIVE METABOLIC PANEL
ALK PHOS: 181 U/L — AB (ref 39–117)
ALT: 87 U/L — AB (ref 0–35)
AST: 68 U/L — ABNORMAL HIGH (ref 0–37)
Albumin: 3.6 g/dL (ref 3.5–5.2)
BUN: 12 mg/dL (ref 6–23)
CO2: 23 mEq/L (ref 19–32)
Calcium: 9.5 mg/dL (ref 8.4–10.5)
Chloride: 102 mEq/L (ref 96–112)
Creatinine, Ser: 0.64 mg/dL (ref 0.50–1.10)
GLUCOSE: 127 mg/dL — AB (ref 70–99)
POTASSIUM: 3.6 meq/L — AB (ref 3.7–5.3)
SODIUM: 140 meq/L (ref 137–147)
Total Bilirubin: <0.2 mg/dL — ABNORMAL LOW (ref 0.3–1.2)
Total Protein: 7.3 g/dL (ref 6.0–8.3)

## 2014-04-02 LAB — CBC WITH DIFFERENTIAL/PLATELET
Basophils Absolute: 0.1 10*3/uL (ref 0.0–0.1)
Basophils Relative: 1 % (ref 0–1)
Eosinophils Absolute: 0.6 10*3/uL (ref 0.0–0.7)
Eosinophils Relative: 5 % (ref 0–5)
HCT: 38.7 % (ref 36.0–46.0)
HEMOGLOBIN: 13.2 g/dL (ref 12.0–15.0)
LYMPHS PCT: 27 % (ref 12–46)
Lymphs Abs: 3 10*3/uL (ref 0.7–4.0)
MCH: 28.8 pg (ref 26.0–34.0)
MCHC: 34.1 g/dL (ref 30.0–36.0)
MCV: 84.5 fL (ref 78.0–100.0)
MONOS PCT: 3 % (ref 3–12)
Monocytes Absolute: 0.4 10*3/uL (ref 0.1–1.0)
Neutro Abs: 7.2 10*3/uL (ref 1.7–7.7)
Neutrophils Relative %: 64 % (ref 43–77)
PLATELETS: 299 10*3/uL (ref 150–400)
RBC: 4.58 MIL/uL (ref 3.87–5.11)
RDW: 13.6 % (ref 11.5–15.5)
WBC: 11.2 10*3/uL — ABNORMAL HIGH (ref 4.0–10.5)

## 2014-04-02 LAB — URINE MICROSCOPIC-ADD ON

## 2014-04-02 LAB — URINALYSIS, ROUTINE W REFLEX MICROSCOPIC
BILIRUBIN URINE: NEGATIVE
GLUCOSE, UA: NEGATIVE mg/dL
KETONES UR: NEGATIVE mg/dL
Leukocytes, UA: NEGATIVE
Nitrite: NEGATIVE
PH: 6 (ref 5.0–8.0)
Protein, ur: NEGATIVE mg/dL
Specific Gravity, Urine: 1.005 (ref 1.005–1.030)
Urobilinogen, UA: 0.2 mg/dL (ref 0.0–1.0)

## 2014-04-02 LAB — LIPASE, BLOOD: Lipase: 65 U/L — ABNORMAL HIGH (ref 11–59)

## 2014-04-02 MED ORDER — ONDANSETRON HCL 4 MG/2ML IJ SOLN
4.0000 mg | Freq: Once | INTRAMUSCULAR | Status: AC
  Administered 2014-04-02: 4 mg via INTRAVENOUS
  Filled 2014-04-02: qty 2

## 2014-04-02 MED ORDER — MORPHINE SULFATE 4 MG/ML IJ SOLN
4.0000 mg | Freq: Once | INTRAMUSCULAR | Status: AC
  Administered 2014-04-02: 4 mg via INTRAVENOUS
  Filled 2014-04-02: qty 1

## 2014-04-02 NOTE — ED Provider Notes (Signed)
CSN: [id number]     Arrival date & time 04/02/14  1647 History   First MD Initiated Contact with Patient 04/02/14 1722     Chief Complaint  Patient presents with  . right upper quadrant pain      (Consider location/radiation/quality/duration/timing/severity/associated sxs/prior Treatment) HPI [name] is a 43 y.o. female who presents to emergency department with complaint of right upper quadrant pain. Patient states she has had pain on and off for several months. States that pain worsened since yesterday. States pain is worsened by eating and palpation of the abdomen. Nothing makes it better. States having loose bowels. Denies any nausea vomiting. Denies any fever. Patient states she went to her primary care Dr. for this a few weeks ago was not ordered any tests at that time. Patient denies any urinary symptoms. No history of the same.   Past Medical History  Diagnosis Date  . Interstitial cystitis   . Bipolar 1 disorder   . Acid reflux   . Hypothyroidism   . Hypothyroid   . DDD (degenerative disc disease)   . Psoriasis   . Asthma    Past Surgical History  Procedure Laterality Date  . Total knee arthroplasty    . Foot surgery     No family history on file. History  Substance Use Topics  . Smoking status: Current Every Day Smoker -- 1.00 packs/day for 29 years    Types: Cigarettes  . Smokeless tobacco: Never Used  . Alcohol Use: No   OB History   Grav Para Term Preterm Abortions TAB SAB Ect Mult Living                 Review of Systems  Constitutional: Negative for fever and chills.  Respiratory: Negative for cough, chest tightness and shortness of breath.   Cardiovascular: Negative for chest pain, palpitations and leg swelling.  Gastrointestinal: Positive for abdominal pain and diarrhea. Negative for nausea, vomiting, constipation and blood in stool.  Genitourinary: Negative for dysuria, flank pain, vaginal bleeding, vaginal discharge, vaginal pain and pelvic  pain.  Musculoskeletal: Negative for back pain.  Skin: Negative for rash.  Neurological: Negative for dizziness, weakness and headaches.  All other systems reviewed and are negative.     Allergies  Zyrtec; Benadryl; Chantix; Claritin; Clindamycin/lincomycin; Doxycycline; Amoxicillin; and Silicone  Home Medications   Prior to Admission medications   Medication Sig Start Date End Date Taking? Authorizing Provider  ABILIFY 15 MG tablet Take 15 mg by mouth every evening.  09/07/13  Yes Historical Provider, MD  albuterol-ipratropium (COMBIVENT) 18-103 MCG/ACT inhaler Inhale 2 puffs into the lungs every 6 (six) hours as needed for wheezing or shortness of breath.   Yes Historical Provider, MD  desmopressin (DDAVP) 0.2 MG tablet Take 0.4 mg by mouth at bedtime.    Yes Historical Provider, MD  desvenlafaxine (PRISTIQ) 100 MG 24 hr tablet Take 100 mg by mouth daily.   Yes Historical Provider, MD  doxepin (SINEQUAN) 25 MG capsule Take 25-50 mg by mouth at bedtime.  09/15/13  Yes Historical Provider, MD  esomeprazole (NEXIUM) 40 MG capsule Take 40 mg by mouth daily before breakfast.   Yes Historical Provider, MD  gabapentin (NEURONTIN) 100 MG capsule Take 100-300 mg by mouth See admin instructions. 100mg  in the AM 300 mg in the evening   Yes Historical Provider, MD  ibuprofen (ADVIL,MOTRIN) 200 MG tablet Take 400 mg by mouth every 6 (six) hours as needed for moderate pain.    Yes  Historical Provider, MD  levothyroxine (SYNTHROID, LEVOTHROID) 50 MCG tablet Take 50 mcg by mouth daily.   Yes Historical Provider, MD  LORazepam (ATIVAN) 1 MG tablet Take 1 mg by mouth every 8 (eight) hours as needed for anxiety.    Yes Historical Provider, MD  meloxicam (MOBIC) 15 MG tablet Take 15 mg by mouth daily.  09/05/13  Yes Historical Provider, MD  oxyCODONE-acetaminophen (PERCOCET) 10-325 MG per tablet Take 0.5-1 tablets by mouth 2 (two) times daily. 0.5 tab in the morning, 1 around noon   Yes Historical Provider,  MD  oxymorphone (OPANA ER) 15 MG 12 hr tablet Take 15 mg by mouth every 12 (twelve) hours.   Yes Historical Provider, MD  Specialty Vitamins Products (CENTRUM SPECIALIST ENERGY PO) Take 1 tablet by mouth daily.   Yes Historical Provider, MD  temazepam (RESTORIL) 30 MG capsule Take 60 mg by mouth at bedtime as needed for sleep.  09/03/13  Yes Historical Provider, MD  tiZANidine (ZANAFLEX) 4 MG tablet Take 4 mg by mouth at bedtime.   Yes Historical Provider, MD   BP 113/58  Pulse 80  Temp(Src) 98.4 F (36.9 C) (Oral)  Resp 18  Ht 4\' 10"  (1.473 m)  Wt 150 lb (68.04 kg)  BMI 31.36 kg/m2  SpO2 97%  LMP 04/02/2014 Physical Exam  Nursing note and vitals reviewed. Constitutional: She is oriented to person, place, and time. She appears well-developed and well-nourished. No distress.  HENT:  Head: Normocephalic.  Eyes: Conjunctivae are normal.  Neck: Neck supple.  Cardiovascular: Normal rate, regular rhythm and normal heart sounds.   Pulmonary/Chest: Effort normal and breath sounds normal. No respiratory distress. She has no wheezes. She has no rales.  Abdominal: Soft. Bowel sounds are normal. She exhibits no distension. There is tenderness. There is no rebound and no guarding.  Right upper quadrant tenderness  Musculoskeletal: She exhibits no edema.  Neurological: She is alert and oriented to person, place, and time.  Skin: Skin is warm and dry.  Psychiatric: She has a normal mood and affect. Her behavior is normal.    ED Course  Procedures (including critical care time) Labs Review Labs Reviewed  CBC WITH DIFFERENTIAL - Abnormal; Notable for the following:    WBC 11.2 (*)    All other components within normal limits  COMPREHENSIVE METABOLIC PANEL - Abnormal; Notable for the following:    Potassium 3.6 (*)    Glucose, Bld 127 (*)    AST 68 (*)    ALT 87 (*)    Alkaline Phosphatase 181 (*)    Total Bilirubin <0.2 (*)    All other components within normal limits  LIPASE, BLOOD -  Abnormal; Notable for the following:    Lipase 65 (*)    All other components within normal limits  URINALYSIS, ROUTINE W REFLEX MICROSCOPIC - Abnormal; Notable for the following:    Hgb urine dipstick TRACE (*)    All other components within normal limits  URINE MICROSCOPIC-ADD ON  I-STAT TROPOININ, ED    Imaging Review US Abdomen Complete  04/02/2014   CLINICAL DATA:  Right upper quadrant abdominal pain.  EXAM: ULTRASOUND ABDOMEN COMPLETE  COMPARISON:  None.  FINDINGS: Gallbladder:  Slightly contracted. No gallstones, wall thickening or pericholecystic fluid. Negative sonographic Murphy sign.  Common bile duct:  Diameter: 4.5 mm  Liver:  There is diffuse increased echogenicity of the liver and decreased through transmission consistent with fatty infiltration. No focal lesions or biliary dilatation.  IVC:  Normal caliber.  Pancreas:  Sonographically unremarkable.  Spleen:  Normal size and echogenicity without focal lesions.  Right Kidney:  Length: 10.4 cm. Normal renal cortical thickness and echogenicity without focal lesions or hydronephrosis.  Left Kidney:  Length: 11.6 cm. Normal renal cortical thickness and echogenicity without focal lesions or hydronephrosis.  Abdominal aorta:  No aneurysm visualized.  Other findings:  None.  IMPRESSION: Slightly contracted gallbladder but no gallstones or findings for acute cholecystitis.  Normal caliber common bile duct.  Normal sonographic appearance of the spleen, pancreas and kidneys. Diffuse fatty infiltration of the liver is noted.   Electronically Signed   By: Kalman Jewels M.D.   On: 04/02/2014 20:08     EKG Interpretation None      MDM   Final diagnoses:  Fatty infiltration of liver    Patient emergency department with right upper quadrant pain, worsened with eating. Patient does have history of GERD. She reports is different pain. No history of prior evaluation for gallbladder disease. We'll get labs and ultrasound.    8:41  PM Patient's labs show mildly elevated LFTs. Her ultrasound showed fatty infiltration of the liver, otherwise negative. I discussed results with patient. Instructed to eat healthy. Followup with her Dr. for further evaluation. Patient states she also has gastroenterology was, will followup with him as well. At this time no evidence of surgical abdomen. She is afebrile. No vomiting. Abdomen is soft.   Filed Vitals:   04/02/14 1657  BP: 113/58  Pulse: 80  Temp: 98.4 F (36.9 C)  TempSrc: Oral  Resp: 18  Height: 4\' 10"  (1.473 m)  Weight: 150 lb (68.04 kg)  SpO2: 97%       Kionna Brier A Jyrah Blye, PA-C 04/03/14 0121

## 2014-04-02 NOTE — ED Notes (Signed)
Pt from home c/o RUQ that gets worse when she eats. She reports that she has seen her PCP and he treated her with steroids and was going to check her gallbladder but never did. This has been on going x several months.

## 2014-04-02 NOTE — Discharge Instructions (Signed)
Your ultrasound showed fatty infiltration of the liver. It is otherwise normal. Your labwork showed mildly elevated liver Function counts. I would any medications that contain Tylenol. Do not drink alcohol. Followup with your primary care Dr. gastroenterologist soon as able for recheck. The information below.   Fatty Liver Fatty liver is the accumulation of fat in liver cells. It is also called hepatosteatosis or steatohepatitis. It is normal for your liver to contain some fat. If fat is more than 5 to 10% of your liver's weight, you have fatty liver.  There are often no symptoms (problems) for years while damage is still occurring. People often learn about their fatty liver when they have medical tests for other reasons. Fat can damage your liver for years or even decades without causing problems. When it becomes severe, it can cause fatigue, weight loss, weakness, and confusion. This makes you more likely to develop more serious liver problems. The liver is the largest organ in the body. It does a lot of work and often gives no warning signs when it is sick until late in a disease. The liver has many important jobs including:  Breaking down foods.  Storing vitamins, iron, and other minerals.  Making proteins.  Making bile for food digestion.  Breaking down many products including medications, alcohol and some poisons. CAUSES  There are a number of different conditions, medications, and poisons that can cause a fatty liver. Eating too many calories causes fat to build up in the liver. Not processing and breaking fats down normally may also cause this. Certain conditions, such as obesity, diabetes, and high triglycerides also cause this. Most fatty liver patients tend to be middle-aged and over weight.  Some causes of fatty liver are:  Alcohol over consumption.  Malnutrition.  Steroid use.  Valproic acid toxicity.  Obesity.  Cushing's syndrome.  Poisons.  Tetracycline in high  dosages.  Pregnancy.  Diabetes.  Hyperlipidemia.  Rapid weight loss. Some people develop fatty liver even having none of these conditions. SYMPTOMS  Fatty liver most often causes no problems. This is called asymptomatic.  It can be diagnosed with blood tests and also by a liver biopsy.  It is one of the most common causes of minor elevations of liver enzymes on routine blood tests.  Specialized Imaging of the liver using ultrasound, CT (computed tomography) scan, or MRI (magnetic resonance imaging) can suggest a fatty liver but a biopsy is needed to confirm it.  A biopsy involves taking a small sample of liver tissue. This is done by using a needle. It is then looked at under a microscope by a specialist. TREATMENT  It is important to treat the cause. Simple fatty liver without a medical reason may not need treatment.  Weight loss, fat restriction, and exercise in overweight patients produces inconsistent results but is worth trying.  Fatty liver due to alcohol toxicity may not improve even with stopping drinking.  Good control of diabetes may reduce fatty liver.  Lower your triglycerides through diet, medication or both.  Eat a balanced, healthy diet.  Increase your physical activity.  Get regular checkups from a liver specialist.  There are no medical or surgical treatments for a fatty liver or NASH, but improving your diet and increasing your exercise may help prevent or reverse some of the damage. PROGNOSIS  Fatty liver may cause no damage or it can lead to an inflammation of the liver. This is, called steatohepatitis. When it is linked to alcohol abuse, it is  called alcoholic steatohepatitis. It often is not linked to alcohol. It is then called nonalcoholic steatohepatitis, or NASH. Over time the liver may become scarred and hardened. This condition is called cirrhosis. Cirrhosis is serious and may lead to liver failure or cancer. NASH is one of the leading causes of  cirrhosis. About 10-20% of Americans have fatty liver and a smaller 2-5% has NASH. Document Released: 01/02/2006 Document Revised: 02/09/2012 Document Reviewed: 02/25/2006 [facility] Patient Information 2014 [employer].

## 2014-04-05 NOTE — ED Provider Notes (Signed)
Medical screening examination/treatment/procedure(s) were performed by non-physician practitioner and as supervising physician I was immediately available for consultation/collaboration.   Davyn Elsasser R Yandel Zeiner, MD 04/05/14 1516 

## 2014-06-05 ENCOUNTER — Emergency Department (HOSPITAL_COMMUNITY): Payer: Medicare Other

## 2014-06-05 ENCOUNTER — Emergency Department (HOSPITAL_COMMUNITY)
Admission: EM | Admit: 2014-06-05 | Discharge: 2014-06-06 | Disposition: A | Discharge status: Home / Self Care | Payer: Medicare Other | Attending: Emergency Medicine | Admitting: Emergency Medicine

## 2014-06-05 ENCOUNTER — Encounter (HOSPITAL_COMMUNITY): Payer: Self-pay | Admitting: Emergency Medicine

## 2014-06-05 DIAGNOSIS — K08109 Complete loss of teeth, unspecified cause, unspecified class: Secondary | ICD-10-CM | POA: Insufficient documentation

## 2014-06-05 DIAGNOSIS — J159 Unspecified bacterial pneumonia: Secondary | ICD-10-CM | POA: Diagnosis not present

## 2014-06-05 DIAGNOSIS — J45909 Unspecified asthma, uncomplicated: Secondary | ICD-10-CM | POA: Insufficient documentation

## 2014-06-05 DIAGNOSIS — L408 Other psoriasis: Secondary | ICD-10-CM | POA: Diagnosis not present

## 2014-06-05 DIAGNOSIS — Z880 Allergy status to penicillin: Secondary | ICD-10-CM | POA: Diagnosis not present

## 2014-06-05 DIAGNOSIS — IMO0002 Reserved for concepts with insufficient information to code with codable children: Secondary | ICD-10-CM | POA: Insufficient documentation

## 2014-06-05 DIAGNOSIS — Z791 Long term (current) use of non-steroidal anti-inflammatories (NSAID): Secondary | ICD-10-CM | POA: Insufficient documentation

## 2014-06-05 DIAGNOSIS — J189 Pneumonia, unspecified organism: Secondary | ICD-10-CM

## 2014-06-05 DIAGNOSIS — Z79899 Other long term (current) drug therapy: Secondary | ICD-10-CM | POA: Insufficient documentation

## 2014-06-05 DIAGNOSIS — Z87448 Personal history of other diseases of urinary system: Secondary | ICD-10-CM | POA: Insufficient documentation

## 2014-06-05 DIAGNOSIS — K219 Gastro-esophageal reflux disease without esophagitis: Secondary | ICD-10-CM | POA: Diagnosis not present

## 2014-06-05 DIAGNOSIS — K089 Disorder of teeth and supporting structures, unspecified: Secondary | ICD-10-CM | POA: Insufficient documentation

## 2014-06-05 DIAGNOSIS — F17200 Nicotine dependence, unspecified, uncomplicated: Secondary | ICD-10-CM | POA: Diagnosis not present

## 2014-06-05 DIAGNOSIS — E039 Hypothyroidism, unspecified: Secondary | ICD-10-CM | POA: Diagnosis not present

## 2014-06-05 DIAGNOSIS — F319 Bipolar disorder, unspecified: Secondary | ICD-10-CM | POA: Insufficient documentation

## 2014-06-05 DIAGNOSIS — K0889 Other specified disorders of teeth and supporting structures: Secondary | ICD-10-CM

## 2014-06-05 DIAGNOSIS — R509 Fever, unspecified: Secondary | ICD-10-CM | POA: Diagnosis present

## 2014-06-05 LAB — CBC WITH DIFFERENTIAL/PLATELET
Basophils Absolute: 0.1 10*3/uL (ref 0.0–0.1)
Basophils Relative: 0 % (ref 0–1)
EOS ABS: 0.4 10*3/uL (ref 0.0–0.7)
EOS PCT: 2 % (ref 0–5)
HCT: 40.3 % (ref 36.0–46.0)
HEMOGLOBIN: 14.1 g/dL (ref 12.0–15.0)
LYMPHS ABS: 1.6 10*3/uL (ref 0.7–4.0)
Lymphocytes Relative: 8 % — ABNORMAL LOW (ref 12–46)
MCH: 30 pg (ref 26.0–34.0)
MCHC: 35 g/dL (ref 30.0–36.0)
MCV: 85.7 fL (ref 78.0–100.0)
Monocytes Absolute: 0.7 10*3/uL (ref 0.1–1.0)
Monocytes Relative: 3 % (ref 3–12)
NEUTROS PCT: 87 % — AB (ref 43–77)
Neutro Abs: 18.2 10*3/uL — ABNORMAL HIGH (ref 1.7–7.7)
Platelets: 234 10*3/uL (ref 150–400)
RBC: 4.7 MIL/uL (ref 3.87–5.11)
RDW: 14.4 % (ref 11.5–15.5)
WBC: 20.9 10*3/uL — ABNORMAL HIGH (ref 4.0–10.5)

## 2014-06-05 LAB — BASIC METABOLIC PANEL
Anion gap: 16 — ABNORMAL HIGH (ref 5–15)
BUN: 9 mg/dL (ref 6–23)
CO2: 23 mEq/L (ref 19–32)
Calcium: 9.6 mg/dL (ref 8.4–10.5)
Chloride: 99 mEq/L (ref 96–112)
Creatinine, Ser: 0.58 mg/dL (ref 0.50–1.10)
GFR calc Af Amer: >90 mL/min (ref >90)
GLUCOSE: 126 mg/dL — AB (ref 70–99)
POTASSIUM: 3.7 meq/L (ref 3.7–5.3)
Sodium: 138 mEq/L (ref 137–147)

## 2014-06-05 MED ORDER — ACETAMINOPHEN 325 MG PO TABS
650.0000 mg | ORAL_TABLET | Freq: Four times a day (QID) | ORAL | Status: DC | PRN
  Administered 2014-06-05: 650 mg via ORAL
  Filled 2014-06-05: qty 2

## 2014-06-05 MED ORDER — SODIUM CHLORIDE 0.9 % IV BOLUS (SEPSIS)
2000.0000 mL | Freq: Once | INTRAVENOUS | Status: AC
  Administered 2014-06-05: 2000 mL via INTRAVENOUS

## 2014-06-05 NOTE — ED Provider Notes (Signed)
CSN: [id number]     Arrival date & time 06/05/14  2225 History   First MD Initiated Contact with Patient 06/05/14 2248     Chief Complaint  Patient presents with  . Fever     (Consider location/radiation/quality/duration/timing/severity/associated sxs/prior Treatment) HPI  [name] is a 43 y.o. female with past medical history of asthma, hypothyroid, interstitial cystitis and bipolar complaining of fever onset today. MAXIMUM TEMPERATURE of 101.3 at home. Patient had 4 teeth pulled 4 days ago. States the pain may have gotten worse since that time. Patient states her dentist gave her for doxycycline tabs one hour prior to extraction because she has bilateral knee replacement. Patient denies pain in knees, redness, swelling, reduced range of motion. Patient denies intraoral swelling or discharge, nausea vomiting, sore throat, difficulty swallowing. Patient endorses runny nose, dry cough onset this a.m. She denies sick contacts, chest pain, shortness of breath, abdominal pain, nausea vomiting, change in bowel or bladder habits, rash, recent travel, abnormal vaginal discharge, dysuria, hematuria, urinary frequency, headache, neck pain.  Past Medical History  Diagnosis Date  . Interstitial cystitis   . Bipolar 1 disorder   . Acid reflux   . Hypothyroidism   . Hypothyroid   . DDD (degenerative disc disease)   . Psoriasis   . Asthma    Past Surgical History  Procedure Laterality Date  . Total knee arthroplasty      x15  . Foot surgery    . Bladder pacemaker      x6   History reviewed. No pertinent family history. History  Substance Use Topics  . Smoking status: Current Every Day Smoker -- 1.00 packs/day for 29 years    Types: Cigarettes  . Smokeless tobacco: Never Used  . Alcohol Use: No   OB History   Grav Para Term Preterm Abortions TAB SAB Ect Mult Living                 Review of Systems  10 systems reviewed and found to be negative, except as noted in the  HPI.   Allergies  Zyrtec; Allegra; Benadryl; Chantix; Claritin; Clindamycin/lincomycin; Amoxicillin; and Silicone  Home Medications   Prior to Admission medications   Medication Sig Start Date End Date Taking? Authorizing Provider  ABILIFY 15 MG tablet Take 15 mg by mouth every evening.  09/07/13  Yes Historical Provider, MD  desmopressin (DDAVP) 0.2 MG tablet Take 0.4 mg by mouth at bedtime.    Yes Historical Provider, MD  desvenlafaxine (PRISTIQ) 100 MG 24 hr tablet Take 100 mg by mouth daily.   Yes Historical Provider, MD  doxepin (SINEQUAN) 25 MG capsule Take 25-50 mg by mouth at bedtime.  09/15/13  Yes Historical Provider, MD  esomeprazole (NEXIUM) 40 MG capsule Take 40 mg by mouth daily before breakfast.   Yes Historical Provider, MD  gabapentin (NEURONTIN) 100 MG capsule Take 100-300 mg by mouth See admin instructions. 100mg  in the AM 300 mg in the evening   Yes Historical Provider, MD  ibuprofen (ADVIL,MOTRIN) 200 MG tablet Take 400 mg by mouth every 6 (six) hours as needed for moderate pain.    Yes Historical Provider, MD  LamoTRIgine (LAMICTAL PO) Take 1 tablet by mouth at bedtime.   Yes Historical Provider, MD  levothyroxine (SYNTHROID, LEVOTHROID) 50 MCG tablet Take 50 mcg by mouth daily.   Yes Historical Provider, MD  LORazepam (ATIVAN) 1 MG tablet Take 1 mg by mouth every 8 (eight) hours as needed for anxiety.  Yes Historical Provider, MD  meloxicam (MOBIC) 15 MG tablet Take 15 mg by mouth daily.  09/05/13  Yes Historical Provider, MD  oxyCODONE-acetaminophen (PERCOCET) 10-325 MG per tablet Take 0.5-1 tablets by mouth 2 (two) times daily. 0.5 tab in the morning, 1 around noon   Yes Historical Provider, MD  oxymorphone (OPANA ER) 15 MG 12 hr tablet Take 15 mg by mouth every 12 (twelve) hours.   Yes Historical Provider, MD  Specialty Vitamins Products (CENTRUM SPECIALIST ENERGY PO) Take 1 tablet by mouth daily.   Yes Historical Provider, MD  temazepam (RESTORIL) 30 MG capsule  Take 60 mg by mouth at bedtime as needed for sleep.  09/03/13  Yes Historical Provider, MD  tiZANidine (ZANAFLEX) 4 MG tablet Take 4 mg by mouth at bedtime.   Yes Historical Provider, MD  albuterol (PROVENTIL HFA;VENTOLIN HFA) 108 (90 BASE) MCG/ACT inhaler Inhale 2 puffs into the lungs every 4 (four) hours as needed for wheezing or shortness of breath. 06/06/14   Nur Rabold, PA-C  azithromycin (ZITHROMAX Z-PAK) 250 MG tablet 2 po day one, then 1 daily x 4 days 06/06/14   Elmyra Ricks Emmogene Simson, PA-C  doxycycline (VIBRAMYCIN) 100 MG capsule Take 1 capsule (100 mg total) by mouth 2 (two) times daily. One po bid x 7 days 06/06/14   Monico Blitz, PA-C  HYDROcodone-acetaminophen (HYCET) 7.5-325 mg/15 ml solution Take 15 mLs by mouth every 4 (four) hours as needed for moderate pain or severe pain. 06/06/14   Shervin Cypert, PA-C   BP 123/63  Pulse 115  Temp(Src) 100.9 F (38.3 C) (Oral)  Resp 16  SpO2 96%  LMP 04/05/2014 Physical Exam  Nursing note and vitals reviewed. Constitutional: She is oriented to person, place, and time. She appears well-developed and well-nourished. No distress.  HENT:  Head: Normocephalic.  Mouth/Throat: Oropharynx is clear and moist.    Generally poor dentition, no gingival swelling, erythema or tenderness to palpation. Patient is handling their secretions. There is no tenderness to palpation or firmness underneath tongue bilaterally. No trismus.    Eyes: Conjunctivae and EOM are normal. Pupils are equal, round, and reactive to light.  Neck: Normal range of motion. Neck supple.  FROM to C-spine. Pt can touch chin to chest without discomfort. No TTP of midline cervical spine.   Cardiovascular: Normal rate, regular rhythm and intact distal pulses.   Pulmonary/Chest: Effort normal and breath sounds normal. No stridor. No respiratory distress. She has no wheezes. She has no rales. She exhibits no tenderness.  Abdominal: Soft. Bowel sounds are normal. She exhibits no  distension and no mass. There is no tenderness. There is no rebound and no guarding.  Musculoskeletal: Normal range of motion. She exhibits no edema.  Bilateral knees with no swelling, warmth, induration or tenderness to palpation. She has excellent range of motion bilaterally and is neurovascularly intact distally.  Neurological: She is alert and oriented to person, place, and time.  Skin: No rash noted.  Psychiatric: She has a normal mood and affect.    ED Course  Procedures (including critical care time) Labs Review Labs Reviewed  CBC WITH DIFFERENTIAL - Abnormal; Notable for the following:    WBC 20.9 (*)    Neutrophils Relative % 87 (*)    Neutro Abs 18.2 (*)    Lymphocytes Relative 8 (*)    All other components within normal limits  BASIC METABOLIC PANEL - Abnormal; Notable for the following:    Glucose, Bld 126 (*)    Anion gap 16 (*)  All other components within normal limits  URINALYSIS, ROUTINE W REFLEX MICROSCOPIC - Abnormal; Notable for the following:    Color, Urine AMBER (*)    APPearance CLOUDY (*)    Bilirubin Urine SMALL (*)    All other components within normal limits  LACTIC ACID, PLASMA    Imaging Review Dg Chest 2 View  06/05/2014   CLINICAL DATA:  Fever and cough starting tonight.  EXAM: CHEST  2 VIEW  COMPARISON:  12/09/2013  FINDINGS: Normal heart size and pulmonary vascularity. There is an increasing diffuse central interstitial pattern to the lung since prior study suggesting bronchiolitis or chronic bronchitic changes. Suggestion of focal nodular infiltration in the left mid lung may indicate superimposed focal pneumonia. Consider follow-up is symptoms continue.  IMPRESSION: Increasing perihilar interstitial changes in the lungs suggesting bronchitis or bronchiolitis. Possible developing focal infiltration in the left mid lung.   Electronically Signed   By: Lucienne Capers M.D.   On: 06/05/2014 23:37     EKG Interpretation None      MDM   Final  diagnoses:  CAP (community acquired pneumonia)  Pain, dental    Filed Vitals:   06/05/14 2232 06/05/14 2358 06/06/14 0001  BP: 133/68  123/63  Pulse: 114  115  Temp: 100.6 F (38.1 C) 100.7 F (38.2 C) 100.9 F (38.3 C)  TempSrc: Oral Oral Oral  Resp: 20  16  SpO2: 95%  96%    Medications  acetaminophen (TYLENOL) tablet 650 mg (650 mg Oral Given 06/05/14 2242)  cefTRIAXone (ROCEPHIN) 1 g in dextrose 5 % 50 mL IVPB (1 g Intravenous New Bag/Given 06/06/14 0040)  ketorolac (TORADOL) 15 MG/ML injection 15 mg (not administered)  sodium chloride 0.9 % bolus 1,000 mL (1,000 mLs Intravenous New Bag/Given 06/06/14 0057)  sodium chloride 0.9 % bolus 2,000 mL (2,000 mLs Intravenous New Bag/Given 06/05/14 2315)  doxycycline (VIBRA-TABS) tablet 100 mg (100 mg Oral Given 06/06/14 0054)  azithromycin (ZITHROMAX) tablet 500 mg (500 mg Oral Given 06/06/14 0053)    [name] is a 43 y.o. female presenting with fever onset today. Febrile to 100.6 in the ED she is tachycardic to 114. Patient bolus 1 L, lactate basic blood work ordered. No signs of overt intraoral infection. Leukocytosis of 20.9. Patient's lactate is normal.    New Prescriptions   ALBUTEROL (PROVENTIL HFA;VENTOLIN HFA) 108 (90 BASE) MCG/ACT INHALER    Inhale 2 puffs into the lungs every 4 (four) hours as needed for wheezing or shortness of breath.   AZITHROMYCIN (ZITHROMAX Z-PAK) 250 MG TABLET    2 po day one, then 1 daily x 4 days   DOXYCYCLINE (VIBRAMYCIN) 100 MG CAPSULE    Take 1 capsule (100 mg total) by mouth 2 (two) times daily. One po bid x 7 days   HYDROCODONE-ACETAMINOPHEN (HYCET) 7.5-325 MG/15 ML SOLUTION    Take 15 mLs by mouth every 4 (four) hours as needed for moderate pain or severe pain.    Patient remains febrile and tachycardic after 650 mg of acetaminophen. Patient will be given Toradol, bolus a another liter. Case signed out to PA [facility] at shift change he will reevaluate vitals and DC as deemed appropriate.       Monico Blitz, PA-C 06/06/14 [phone]

## 2014-06-05 NOTE — ED Notes (Signed)
Patient c/o fever, onset today, registered 101.3 at home. Patient states she had 4 teeth pulled 06/01/2014 and took 4 doxycycline prior to extraction. Patient states she also has had to knee replacements. Patient has not taken any medication for fever at home.

## 2014-06-05 NOTE — ED Notes (Signed)
Per patient- Had 5 upper teeth pulled approx 2 weeks ago, one lower tooth pulled on the second. Was given 100 mg Doxycycline x4. No abx given after procedure. Fever started today, tmax 101.3. Denies N, V, D. In NAD.

## 2014-06-06 DIAGNOSIS — J159 Unspecified bacterial pneumonia: Secondary | ICD-10-CM | POA: Diagnosis not present

## 2014-06-06 LAB — URINALYSIS, ROUTINE W REFLEX MICROSCOPIC
GLUCOSE, UA: NEGATIVE mg/dL
Hgb urine dipstick: NEGATIVE
KETONES UR: NEGATIVE mg/dL
LEUKOCYTES UA: NEGATIVE
Nitrite: NEGATIVE
Protein, ur: NEGATIVE mg/dL
Specific Gravity, Urine: 1.029 (ref 1.005–1.030)
Urobilinogen, UA: 1 mg/dL (ref 0.0–1.0)
pH: 5.5 (ref 5.0–8.0)

## 2014-06-06 LAB — LACTIC ACID, PLASMA: Lactic Acid, Venous: 1.9 mmol/L (ref 0.5–2.2)

## 2014-06-06 MED ORDER — DOXYCYCLINE HYCLATE 100 MG PO CAPS: 100.0000 mg | ORAL_CAPSULE | Freq: Two times a day (BID) | ORAL | Status: DC

## 2014-06-06 MED ORDER — DEXTROSE 5 % IV SOLN
1.0000 g | Freq: Once | INTRAVENOUS | Status: AC
  Administered 2014-06-06: 1 g via INTRAVENOUS
  Filled 2014-06-06: qty 10

## 2014-06-06 MED ORDER — AZITHROMYCIN 250 MG PO TABS
500.0000 mg | ORAL_TABLET | Freq: Once | ORAL | Status: AC
  Administered 2014-06-06: 500 mg via ORAL
  Filled 2014-06-06: qty 2

## 2014-06-06 MED ORDER — SODIUM CHLORIDE 0.9 % IV BOLUS (SEPSIS)
1000.0000 mL | Freq: Once | INTRAVENOUS | Status: AC
  Administered 2014-06-06: 1000 mL via INTRAVENOUS

## 2014-06-06 MED ORDER — KETOROLAC TROMETHAMINE 15 MG/ML IJ SOLN
15.0000 mg | Freq: Once | INTRAMUSCULAR | Status: AC
  Administered 2014-06-06: 15 mg via INTRAVENOUS
  Filled 2014-06-06: qty 1

## 2014-06-06 MED ORDER — AZITHROMYCIN 250 MG PO TABS: ORAL_TABLET | ORAL | Status: DC

## 2014-06-06 MED ORDER — ALBUTEROL SULFATE HFA 108 (90 BASE) MCG/ACT IN AERS: 2.0000 | INHALATION_SPRAY | RESPIRATORY_TRACT | Status: DC | PRN

## 2014-06-06 MED ORDER — HYDROCODONE-ACETAMINOPHEN 7.5-325 MG/15ML PO SOLN: 15.0000 mL | ORAL | Status: DC | PRN

## 2014-06-06 MED ORDER — DOXYCYCLINE HYCLATE 100 MG PO TABS
100.0000 mg | ORAL_TABLET | Freq: Once | ORAL | Status: AC
  Administered 2014-06-06: 100 mg via ORAL
  Filled 2014-06-06: qty 1

## 2014-06-06 NOTE — ED Provider Notes (Signed)
Medical screening examination/treatment/procedure(s) were performed by non-physician practitioner and as supervising physician I was immediately available for consultation/collaboration.   EKG Interpretation None        Wynetta Fines, MD 06/06/14 [phone]

## 2014-06-06 NOTE — Discharge Instructions (Signed)
Return to the emergency room for any worsening or concerning symptoms including fast breathing, heart racing, confusion, vomiting.  Rest, cover your mouth when you cough and wash your hands frequently.   Take your antibiotics as directed and to completion. You should never have any leftover antibiotics! Push fluids and stay well hydrated.   Push fluids: water or Gatorade, do not drink any soda, juice or caffeinated beverages.  For fever and pain control you can take Motrin (ibuprofen) as follows: 400 mg (this is normally 2 over the counter pills) every 4 hours with food.  Do not return to work until a day after your fever breaks.   Take Hycet  for cough and pain control, do not drink alcohol, drive, care for children or do other critical tasks while taking Hycet

## 2014-07-01 ENCOUNTER — Observation Stay (HOSPITAL_BASED_OUTPATIENT_CLINIC_OR_DEPARTMENT_OTHER)
Admission: EM | Admit: 2014-07-01 | Discharge: 2014-07-02 | Discharge status: Against Medical Advice | Payer: Medicare Other | Source: Home / Self Care | Attending: Emergency Medicine | Admitting: Emergency Medicine

## 2014-07-01 ENCOUNTER — Emergency Department (HOSPITAL_BASED_OUTPATIENT_CLINIC_OR_DEPARTMENT_OTHER): Payer: Medicare Other

## 2014-07-01 ENCOUNTER — Encounter (HOSPITAL_BASED_OUTPATIENT_CLINIC_OR_DEPARTMENT_OTHER): Payer: Self-pay | Admitting: Emergency Medicine

## 2014-07-01 DIAGNOSIS — R059 Cough, unspecified: Secondary | ICD-10-CM | POA: Diagnosis present

## 2014-07-01 DIAGNOSIS — J45909 Unspecified asthma, uncomplicated: Secondary | ICD-10-CM | POA: Diagnosis not present

## 2014-07-01 DIAGNOSIS — R05 Cough: Secondary | ICD-10-CM | POA: Diagnosis present

## 2014-07-01 DIAGNOSIS — J189 Pneumonia, unspecified organism: Secondary | ICD-10-CM | POA: Diagnosis not present

## 2014-07-01 HISTORY — DX: Pneumonia, unspecified organism: J18.9

## 2014-07-01 MED ORDER — KETOROLAC TROMETHAMINE 30 MG/ML IJ SOLN
30.0000 mg | Freq: Once | INTRAMUSCULAR | Status: AC
  Administered 2014-07-02: 30 mg via INTRAVENOUS
  Filled 2014-07-01: qty 1

## 2014-07-01 MED ORDER — SODIUM CHLORIDE 0.9 % IV BOLUS (SEPSIS)
1000.0000 mL | Freq: Once | INTRAVENOUS | Status: AC
  Administered 2014-07-02: 1000 mL via INTRAVENOUS

## 2014-07-01 NOTE — ED Notes (Signed)
Patient transported to X-ray ambulatory with tech. 

## 2014-07-01 NOTE — ED Notes (Signed)
Pt reports dx with pneumonia 2 weeks ago, had resolved.  Recently symptoms have recurred with low grade temps and body aches.

## 2014-07-01 NOTE — ED Provider Notes (Signed)
CSN: [id number]     Arrival date & time 07/01/14  2204 History   First MD Initiated Contact with Patient 07/01/14 2336    This chart was scribed for Katie Moch Alfonso Patten, MD by Terressa Koyanagi, ED Scribe. This patient was seen in room MH03/MH03 and the patient's care was started at 11:44 PM.  Chief Complaint  Patient presents with  . Cough   Patient is a 43 y.o. female presenting with cough. The history is provided by the patient. No language interpreter was used.  Cough Cough characteristics:  Non-productive Severity:  Moderate Onset quality:  Gradual Timing:  Intermittent Progression:  Worsening Chronicity:  Recurrent Smoker: yes   Context: not weather changes   Relieved by:  Nothing Worsened by:  Nothing tried Ineffective treatments:  None tried Associated symptoms: fever and myalgias   Associated symptoms: no chest pain, no rhinorrhea, no weight loss and no wheezing   Fever:    Timing:  Intermittent   Temp source:  Oral   Progression:  Worsening Myalgias:    Location:  Generalized   Quality:  Aching   Severity:  Moderate   Onset quality:  Gradual   Timing:  Constant   Progression:  Unchanged Risk factors: no chemical exposure    HPI Comments: [name] is a 43 y.o. female who presents to the Emergency Department complaining of intermittent low grade fever with associated body aches, cough and nausea.   Pt reports that she was Dx with pneumonia 2 weeks ago which has resolved.   Past Medical History  Diagnosis Date  . Interstitial cystitis   . Bipolar 1 disorder   . Acid reflux   . Hypothyroidism   . Hypothyroid   . DDD (degenerative disc disease)   . Psoriasis   . Asthma   . Pneumonia    Past Surgical History  Procedure Laterality Date  . Total knee arthroplasty      x15  . Foot surgery    . Bladder pacemaker      x6   No family history on file. History  Substance Use Topics  . Smoking status: Current Every Day Smoker -- 1.00 packs/day for 29  years    Types: Cigarettes  . Smokeless tobacco: Never Used  . Alcohol Use: No   OB History   Grav Para Term Preterm Abortions TAB SAB Ect Mult Living                 Review of Systems  Constitutional: Positive for fever. Negative for weight loss.  HENT: Negative for rhinorrhea.   Respiratory: Positive for cough. Negative for wheezing.   Cardiovascular: Negative for chest pain, palpitations and leg swelling.  Musculoskeletal: Positive for myalgias.       Body aches   All other systems reviewed and are negative.     Allergies  Zyrtec; Allegra; Benadryl; Chantix; Claritin; Clindamycin/lincomycin; Amoxicillin; Lamictal; and Silicone  Home Medications   Prior to Admission medications   Medication Sig Start Date End Date Taking? Authorizing Provider  ABILIFY 15 MG tablet Take 15 mg by mouth every evening.  09/07/13   Historical Provider, MD  albuterol (PROVENTIL HFA;VENTOLIN HFA) 108 (90 BASE) MCG/ACT inhaler Inhale 2 puffs into the lungs every 4 (four) hours as needed for wheezing or shortness of breath. 06/06/14   Nicole Pisciotta, PA-C  azithromycin (ZITHROMAX Z-PAK) 250 MG tablet 2 po day one, then 1 daily x 4 days 06/06/14   Elmyra Ricks Pisciotta, PA-C  desmopressin (DDAVP) 0.2 MG  tablet Take 0.4 mg by mouth at bedtime.     Historical Provider, MD  desvenlafaxine (PRISTIQ) 100 MG 24 hr tablet Take 100 mg by mouth daily.    Historical Provider, MD  doxepin (SINEQUAN) 25 MG capsule Take 25-50 mg by mouth at bedtime.  09/15/13   Historical Provider, MD  doxycycline (VIBRAMYCIN) 100 MG capsule Take 1 capsule (100 mg total) by mouth 2 (two) times daily. One po bid x 7 days 06/06/14   Elmyra Ricks Pisciotta, PA-C  esomeprazole (NEXIUM) 40 MG capsule Take 40 mg by mouth daily before breakfast.    Historical Provider, MD  gabapentin (NEURONTIN) 100 MG capsule Take 100-300 mg by mouth See admin instructions. 100mg  in the AM 300 mg in the evening    Historical Provider, MD  HYDROcodone-acetaminophen  (HYCET) 7.5-325 mg/15 ml solution Take 15 mLs by mouth every 4 (four) hours as needed for moderate pain or severe pain. 06/06/14   Nicole Pisciotta, PA-C  ibuprofen (ADVIL,MOTRIN) 200 MG tablet Take 400 mg by mouth every 6 (six) hours as needed for moderate pain.     Historical Provider, MD  LamoTRIgine (LAMICTAL PO) Take 1 tablet by mouth at bedtime.    Historical Provider, MD  levothyroxine (SYNTHROID, LEVOTHROID) 50 MCG tablet Take 50 mcg by mouth daily.    Historical Provider, MD  LORazepam (ATIVAN) 1 MG tablet Take 1 mg by mouth every 8 (eight) hours as needed for anxiety.     Historical Provider, MD  meloxicam (MOBIC) 15 MG tablet Take 15 mg by mouth daily.  09/05/13   Historical Provider, MD  oxyCODONE-acetaminophen (PERCOCET) 10-325 MG per tablet Take 0.5-1 tablets by mouth 2 (two) times daily. 0.5 tab in the morning, 1 around noon    Historical Provider, MD  oxymorphone (OPANA ER) 15 MG 12 hr tablet Take 15 mg by mouth every 12 (twelve) hours.    Historical Provider, MD  Specialty Vitamins Products (CENTRUM SPECIALIST ENERGY PO) Take 1 tablet by mouth daily.    Historical Provider, MD  temazepam (RESTORIL) 30 MG capsule Take 60 mg by mouth at bedtime as needed for sleep.  09/03/13   Historical Provider, MD  tiZANidine (ZANAFLEX) 4 MG tablet Take 4 mg by mouth at bedtime.    Historical Provider, MD   Triage Vitals: BP 138/82  Pulse 108  Temp(Src) 98.6 F (37 C) (Oral)  Resp 18  Ht 4' 9.75" (1.467 m)  Wt 150 lb (68.04 kg)  BMI 31.62 kg/m2  SpO2 96%  LMP 04/05/2014 Physical Exam  Nursing note and vitals reviewed. Constitutional: She is oriented to person, place, and time. She appears well-developed and well-nourished. No distress.  Awake, alert, nontoxic appearance with baseline speech for patient.  HENT:  Head: Normocephalic and atraumatic.  Right Ear: External ear normal.  Left Ear: External ear normal.  Mouth/Throat: Oropharynx is clear and moist. No oropharyngeal exudate.  Eyes:  EOM are normal. Pupils are equal, round, and reactive to light. Right eye exhibits no discharge. Left eye exhibits no discharge.  Neck: Normal range of motion. Neck supple.  Cardiovascular: Normal rate and regular rhythm.   No murmur heard. Pulmonary/Chest: Effort normal. No stridor. No respiratory distress. She has decreased breath sounds. She has no wheezes.  Abdominal: Soft. She exhibits no mass. There is no tenderness. There is no rebound and no guarding.  Hyperactive bowel sounds. Stool palpable throughout the colon.   Musculoskeletal: Normal range of motion. She exhibits no tenderness.  Baseline ROM, moves extremities with no  obvious new focal weakness.  Lymphadenopathy:    She has no cervical adenopathy.  Neurological: She is alert and oriented to person, place, and time. She has normal reflexes.  Skin: Skin is warm and dry. No rash noted. She is not diaphoretic.  Psychiatric: She has a normal mood and affect.    ED Course  Procedures (including critical care time) DIAGNOSTIC STUDIES: Oxygen Saturation is 96% on RA, nl by my interpretation.    COORDINATION OF CARE: 11:48 PM-Discussed treatment plan which includes CT of chest, CBC and UA with pt at bedside and pt agreed to plan.   Labs Review Labs Reviewed - No data to display  Imaging Review Dg Chest 2 View  07/01/2014   CLINICAL DATA:  Cough, body aches, low-grade temperature. Previous pneumonia 2 weeks ago. Symptoms resolved and now have recurred.  EXAM: CHEST  2 VIEW  COMPARISON:  06/05/2014  FINDINGS: Normal heart size and pulmonary vascularity. Persistent perihilar interstitial changes consistent with bronchiolitis or reactive airways disease. Persistent vague nodular infiltrative changes in the left mid lung, similar prior study. Given persistence of these findings, suggest follow-up imaging after resolution of acute process to exclude developing pulmonary nodules. No blunting of costophrenic angles. No pneumothorax.   IMPRESSION: Persistent perihilar interstitial changes consistent with bronchiolitis or reactive airways disease. Persistent vague nodular infiltrative changes in the left mid lung, similar to prior study. Findings likely represent residual or recurrent infection, but given persistence of findings, follow-up imaging after resolution of acute process is recommended to exclude developing pulmonary nodules.   Electronically Signed   By: Lucienne Capers M.D.   On: 07/01/2014 22:40     EKG Interpretation None      MDM   Final diagnoses:  None   MDM Reviewed: previous chart, nursing note and vitals Reviewed previous: labs and x-ray Interpretation: labs, x-ray and CT scan Consults: admitting MD   I personally performed the services described in this documentation, which was scribed in my presence. The recorded information has been reviewed and is accurate.     Carlisle Beers, MD 07/02/14 [phone]

## 2014-07-02 ENCOUNTER — Encounter (HOSPITAL_COMMUNITY): Payer: Self-pay | Admitting: Emergency Medicine

## 2014-07-02 ENCOUNTER — Encounter (HOSPITAL_BASED_OUTPATIENT_CLINIC_OR_DEPARTMENT_OTHER): Payer: Self-pay | Admitting: Emergency Medicine

## 2014-07-02 ENCOUNTER — Inpatient Hospital Stay (HOSPITAL_COMMUNITY)
Admission: EM | Admit: 2014-07-02 | Discharge: 2014-07-02 | DRG: 195 | Disposition: A | Discharge status: Home / Self Care | Payer: Medicare Other | Attending: Internal Medicine | Admitting: Internal Medicine

## 2014-07-02 DIAGNOSIS — R059 Cough, unspecified: Secondary | ICD-10-CM

## 2014-07-02 DIAGNOSIS — F319 Bipolar disorder, unspecified: Secondary | ICD-10-CM | POA: Diagnosis present

## 2014-07-02 DIAGNOSIS — F17210 Nicotine dependence, cigarettes, uncomplicated: Secondary | ICD-10-CM | POA: Diagnosis present

## 2014-07-02 DIAGNOSIS — F17213 Nicotine dependence, cigarettes, with withdrawal: Secondary | ICD-10-CM

## 2014-07-02 DIAGNOSIS — Z79899 Other long term (current) drug therapy: Secondary | ICD-10-CM | POA: Diagnosis not present

## 2014-07-02 DIAGNOSIS — L408 Other psoriasis: Secondary | ICD-10-CM | POA: Diagnosis present

## 2014-07-02 DIAGNOSIS — E860 Dehydration: Secondary | ICD-10-CM | POA: Diagnosis present

## 2014-07-02 DIAGNOSIS — J45909 Unspecified asthma, uncomplicated: Secondary | ICD-10-CM | POA: Diagnosis present

## 2014-07-02 DIAGNOSIS — R05 Cough: Secondary | ICD-10-CM | POA: Diagnosis present

## 2014-07-02 DIAGNOSIS — N3010 Interstitial cystitis (chronic) without hematuria: Secondary | ICD-10-CM | POA: Diagnosis present

## 2014-07-02 DIAGNOSIS — F19939 Other psychoactive substance use, unspecified with withdrawal, unspecified: Secondary | ICD-10-CM

## 2014-07-02 DIAGNOSIS — J189 Pneumonia, unspecified organism: Secondary | ICD-10-CM | POA: Diagnosis present

## 2014-07-02 DIAGNOSIS — Z833 Family history of diabetes mellitus: Secondary | ICD-10-CM

## 2014-07-02 DIAGNOSIS — K219 Gastro-esophageal reflux disease without esophagitis: Secondary | ICD-10-CM | POA: Diagnosis present

## 2014-07-02 DIAGNOSIS — F17200 Nicotine dependence, unspecified, uncomplicated: Secondary | ICD-10-CM | POA: Diagnosis present

## 2014-07-02 DIAGNOSIS — Z6379 Other stressful life events affecting family and household: Secondary | ICD-10-CM | POA: Diagnosis not present

## 2014-07-02 DIAGNOSIS — E039 Hypothyroidism, unspecified: Secondary | ICD-10-CM | POA: Diagnosis present

## 2014-07-02 DIAGNOSIS — Z881 Allergy status to other antibiotic agents status: Secondary | ICD-10-CM | POA: Diagnosis not present

## 2014-07-02 DIAGNOSIS — Z96659 Presence of unspecified artificial knee joint: Secondary | ICD-10-CM

## 2014-07-02 DIAGNOSIS — Z888 Allergy status to other drugs, medicaments and biological substances status: Secondary | ICD-10-CM | POA: Diagnosis not present

## 2014-07-02 DIAGNOSIS — J8410 Pulmonary fibrosis, unspecified: Secondary | ICD-10-CM | POA: Diagnosis present

## 2014-07-02 DIAGNOSIS — D72829 Elevated white blood cell count, unspecified: Secondary | ICD-10-CM

## 2014-07-02 LAB — CBC WITH DIFFERENTIAL/PLATELET
BASOS ABS: 0.1 10*3/uL (ref 0.0–0.1)
Basophils Absolute: 0 10*3/uL (ref 0.0–0.1)
Basophils Relative: 0 % (ref 0–1)
Basophils Relative: 1 % (ref 0–1)
EOS PCT: 5 % (ref 0–5)
Eosinophils Absolute: 0.7 10*3/uL (ref 0.0–0.7)
Eosinophils Absolute: 0.7 10*3/uL (ref 0.0–0.7)
Eosinophils Relative: 4 % (ref 0–5)
HCT: 36.5 % (ref 36.0–46.0)
HEMATOCRIT: 39.1 % (ref 36.0–46.0)
HEMOGLOBIN: 12.6 g/dL (ref 12.0–15.0)
Hemoglobin: 13.5 g/dL (ref 12.0–15.0)
LYMPHS ABS: 2.6 10*3/uL (ref 0.7–4.0)
LYMPHS PCT: 15 % (ref 12–46)
Lymphocytes Relative: 19 % (ref 12–46)
Lymphs Abs: 2.6 10*3/uL (ref 0.7–4.0)
MCH: 29.8 pg (ref 26.0–34.0)
MCH: 30 pg (ref 26.0–34.0)
MCHC: 34.5 g/dL (ref 30.0–36.0)
MCHC: 34.5 g/dL (ref 30.0–36.0)
MCV: 86.3 fL (ref 78.0–100.0)
MCV: 86.9 fL (ref 78.0–100.0)
MONO ABS: 0.7 10*3/uL (ref 0.1–1.0)
MONOS PCT: 4 % (ref 3–12)
Monocytes Absolute: 0.7 10*3/uL (ref 0.1–1.0)
Monocytes Relative: 5 % (ref 3–12)
NEUTROS PCT: 71 % (ref 43–77)
NEUTROS PCT: 76 % (ref 43–77)
Neutro Abs: 13.1 10*3/uL — ABNORMAL HIGH (ref 1.7–7.7)
Neutro Abs: 9.2 10*3/uL — ABNORMAL HIGH (ref 1.7–7.7)
PLATELETS: 181 10*3/uL (ref 150–400)
Platelets: 220 10*3/uL (ref 150–400)
RBC: 4.23 MIL/uL (ref 3.87–5.11)
RBC: 4.5 MIL/uL (ref 3.87–5.11)
RDW: 13.7 % (ref 11.5–15.5)
RDW: 14 % (ref 11.5–15.5)
WBC: 13.2 10*3/uL — AB (ref 4.0–10.5)
WBC: 17.1 10*3/uL — AB (ref 4.0–10.5)

## 2014-07-02 LAB — BASIC METABOLIC PANEL
ANION GAP: 15 (ref 5–15)
BUN: 13 mg/dL (ref 6–23)
CO2: 24 meq/L (ref 19–32)
CREATININE: 0.6 mg/dL (ref 0.50–1.10)
Calcium: 10 mg/dL (ref 8.4–10.5)
Chloride: 98 mEq/L (ref 96–112)
GFR calc non Af Amer: >90 mL/min (ref >90)
Glucose, Bld: 167 mg/dL — ABNORMAL HIGH (ref 70–99)
Potassium: 3.8 mEq/L (ref 3.7–5.3)
Sodium: 137 mEq/L (ref 137–147)

## 2014-07-02 LAB — URINALYSIS, ROUTINE W REFLEX MICROSCOPIC
Glucose, UA: NEGATIVE mg/dL
Hgb urine dipstick: NEGATIVE
Ketones, ur: 15 mg/dL — AB
Leukocytes, UA: NEGATIVE
NITRITE: NEGATIVE
Protein, ur: NEGATIVE mg/dL
SPECIFIC GRAVITY, URINE: 1.031 — AB (ref 1.005–1.030)
UROBILINOGEN UA: 1 mg/dL (ref 0.0–1.0)
pH: 6 (ref 5.0–8.0)

## 2014-07-02 LAB — CREATININE, SERUM
CREATININE: 0.54 mg/dL (ref 0.50–1.10)
GFR calc Af Amer: >90 mL/min (ref >90)
GFR calc non Af Amer: >90 mL/min (ref >90)

## 2014-07-02 LAB — STREP PNEUMONIAE URINARY ANTIGEN: Strep Pneumo Urinary Antigen: NEGATIVE

## 2014-07-02 LAB — PREGNANCY, URINE: PREG TEST UR: NEGATIVE

## 2014-07-02 LAB — HIV ANTIBODY (ROUTINE TESTING W REFLEX): HIV: NONREACTIVE

## 2014-07-02 MED ORDER — OXYCODONE-ACETAMINOPHEN 5-325 MG PO TABS: 0.5000 | ORAL_TABLET | Freq: Four times a day (QID) | ORAL | Status: DC | PRN

## 2014-07-02 MED ORDER — OXYCODONE-ACETAMINOPHEN 10-325 MG PO TABS: 0.5000 | ORAL_TABLET | Freq: Four times a day (QID) | ORAL | Status: DC | PRN

## 2014-07-02 MED ORDER — VANCOMYCIN HCL IN DEXTROSE 1-5 GM/200ML-% IV SOLN: 1000.0000 mg | Freq: Once | INTRAVENOUS | Status: DC

## 2014-07-02 MED ORDER — GABAPENTIN 100 MG PO CAPS
200.0000 mg | ORAL_CAPSULE | Freq: Every day | ORAL | Status: DC
  Filled 2014-07-02: qty 2

## 2014-07-02 MED ORDER — DEXTROSE 5 % IV SOLN
1.0000 g | Freq: Two times a day (BID) | INTRAVENOUS | Status: DC
  Administered 2014-07-02: 1 g via INTRAVENOUS
  Filled 2014-07-02 (×2): qty 1

## 2014-07-02 MED ORDER — CARBAMAZEPINE ER 200 MG PO CP12
200.0000 mg | ORAL_CAPSULE | Freq: Every day | ORAL | Status: DC
  Filled 2014-07-02: qty 1

## 2014-07-02 MED ORDER — VENLAFAXINE HCL ER 150 MG PO CP24
150.0000 mg | ORAL_CAPSULE | Freq: Every day | ORAL | Status: DC
  Administered 2014-07-02: 150 mg via ORAL
  Filled 2014-07-02 (×2): qty 1

## 2014-07-02 MED ORDER — GABAPENTIN 100 MG PO CAPS
100.0000 mg | ORAL_CAPSULE | Freq: Every day | ORAL | Status: DC
  Administered 2014-07-02: 100 mg via ORAL
  Filled 2014-07-02: qty 1

## 2014-07-02 MED ORDER — DOXEPIN HCL 25 MG PO CAPS
25.0000 mg | ORAL_CAPSULE | Freq: Every day | ORAL | Status: DC
  Filled 2014-07-02: qty 2

## 2014-07-02 MED ORDER — TIZANIDINE HCL 4 MG PO TABS
4.0000 mg | ORAL_TABLET | Freq: Every day | ORAL | Status: DC
  Filled 2014-07-02: qty 1

## 2014-07-02 MED ORDER — LEVOFLOXACIN IN D5W 500 MG/100ML IV SOLN
500.0000 mg | INTRAVENOUS | Status: DC
  Administered 2014-07-02: 500 mg via INTRAVENOUS
  Filled 2014-07-02: qty 100

## 2014-07-02 MED ORDER — GABAPENTIN 300 MG PO CAPS
300.0000 mg | ORAL_CAPSULE | Freq: Every day | ORAL | Status: DC
  Filled 2014-07-02: qty 1

## 2014-07-02 MED ORDER — LEVOTHYROXINE SODIUM 50 MCG PO TABS
50.0000 ug | ORAL_TABLET | Freq: Every day | ORAL | Status: DC
  Administered 2014-07-02: 50 ug via ORAL
  Filled 2014-07-02 (×2): qty 1

## 2014-07-02 MED ORDER — LORAZEPAM 1 MG PO TABS
1.0000 mg | ORAL_TABLET | Freq: Three times a day (TID) | ORAL | Status: DC | PRN
  Administered 2014-07-02 (×2): 1 mg via ORAL
  Filled 2014-07-02 (×2): qty 1

## 2014-07-02 MED ORDER — NICOTINE 21 MG/24HR TD PT24
21.0000 mg | MEDICATED_PATCH | Freq: Every day | TRANSDERMAL | Status: DC
  Administered 2014-07-02: 21 mg via TRANSDERMAL
  Filled 2014-07-02: qty 1

## 2014-07-02 MED ORDER — ALBUTEROL SULFATE HFA 108 (90 BASE) MCG/ACT IN AERS: 2.0000 | INHALATION_SPRAY | RESPIRATORY_TRACT | Status: DC | PRN

## 2014-07-02 MED ORDER — IPRATROPIUM-ALBUTEROL 0.5-2.5 (3) MG/3ML IN SOLN: 3.0000 mL | Freq: Four times a day (QID) | RESPIRATORY_TRACT | Status: DC | PRN

## 2014-07-02 MED ORDER — MORPHINE SULFATE ER 30 MG PO TBCR
30.0000 mg | EXTENDED_RELEASE_TABLET | Freq: Two times a day (BID) | ORAL | Status: DC
  Administered 2014-07-02 (×2): 30 mg via ORAL
  Filled 2014-07-02 (×2): qty 1

## 2014-07-02 MED ORDER — OXYCODONE HCL 5 MG PO TABS: 2.5000 mg | ORAL_TABLET | Freq: Four times a day (QID) | ORAL | Status: DC | PRN

## 2014-07-02 MED ORDER — IOHEXOL 350 MG/ML SOLN
100.0000 mL | Freq: Once | INTRAVENOUS | Status: AC | PRN
  Administered 2014-07-02: 100 mL via INTRAVENOUS

## 2014-07-02 MED ORDER — ALBUTEROL SULFATE (2.5 MG/3ML) 0.083% IN NEBU: 3.0000 mL | INHALATION_SOLUTION | RESPIRATORY_TRACT | Status: DC | PRN

## 2014-07-02 MED ORDER — DESMOPRESSIN ACETATE 0.2 MG PO TABS
0.4000 mg | ORAL_TABLET | Freq: Every day | ORAL | Status: DC
  Filled 2014-07-02: qty 2

## 2014-07-02 MED ORDER — TEMAZEPAM 15 MG PO CAPS: 60.0000 mg | ORAL_CAPSULE | Freq: Every evening | ORAL | Status: DC | PRN

## 2014-07-02 MED ORDER — VANCOMYCIN HCL IN DEXTROSE 750-5 MG/150ML-% IV SOLN
750.0000 mg | Freq: Two times a day (BID) | INTRAVENOUS | Status: DC
  Administered 2014-07-02: 750 mg via INTRAVENOUS
  Filled 2014-07-02 (×2): qty 150

## 2014-07-02 MED ORDER — ENOXAPARIN SODIUM 40 MG/0.4ML ~~LOC~~ SOLN
40.0000 mg | SUBCUTANEOUS | Status: DC
  Administered 2014-07-02: 40 mg via SUBCUTANEOUS
  Filled 2014-07-02: qty 0.4

## 2014-07-02 MED ORDER — SODIUM CHLORIDE 0.9 % IV SOLN
INTRAVENOUS | Status: DC
  Administered 2014-07-02: 07:00:00 via INTRAVENOUS

## 2014-07-02 MED ORDER — GABAPENTIN 100 MG PO CAPS
100.0000 mg | ORAL_CAPSULE | ORAL | Status: DC
  Filled 2014-07-02: qty 1

## 2014-07-02 MED ORDER — HYDROCODONE-ACETAMINOPHEN 7.5-325 MG/15ML PO SOLN
15.0000 mL | ORAL | Status: DC | PRN
  Administered 2014-07-02: 15 mL via ORAL
  Filled 2014-07-02: qty 15

## 2014-07-02 MED ORDER — DOXEPIN HCL 50 MG PO CAPS
50.0000 mg | ORAL_CAPSULE | Freq: Every evening | ORAL | Status: DC | PRN
  Filled 2014-07-02: qty 2

## 2014-07-02 MED ORDER — CARBAMAZEPINE ER 100 MG PO TB12
100.0000 mg | ORAL_TABLET | Freq: Two times a day (BID) | ORAL | Status: DC
  Filled 2014-07-02 (×2): qty 1

## 2014-07-02 MED ORDER — MELOXICAM 15 MG PO TABS
15.0000 mg | ORAL_TABLET | Freq: Every day | ORAL | Status: DC
  Administered 2014-07-02: 15 mg via ORAL
  Filled 2014-07-02: qty 1

## 2014-07-02 MED ORDER — TEMAZEPAM 15 MG PO CAPS: 30.0000 mg | ORAL_CAPSULE | Freq: Every evening | ORAL | Status: DC | PRN

## 2014-07-02 MED ORDER — ARIPIPRAZOLE 15 MG PO TABS
15.0000 mg | ORAL_TABLET | Freq: Every evening | ORAL | Status: DC
  Administered 2014-07-02: 15 mg via ORAL
  Filled 2014-07-02: qty 1

## 2014-07-02 MED ORDER — DEXTROSE 5 % IV SOLN
500.0000 mg | INTRAVENOUS | Status: DC
  Administered 2014-07-02: 500 mg via INTRAVENOUS
  Filled 2014-07-02 (×2): qty 500

## 2014-07-02 MED ORDER — PANTOPRAZOLE SODIUM 40 MG PO TBEC
40.0000 mg | DELAYED_RELEASE_TABLET | Freq: Every day | ORAL | Status: DC
  Administered 2014-07-02: 40 mg via ORAL
  Filled 2014-07-02: qty 1

## 2014-07-02 MED ORDER — LEVOFLOXACIN 750 MG PO TABS
750.0000 mg | ORAL_TABLET | ORAL | Status: DC
  Filled 2014-07-02: qty 1

## 2014-07-02 NOTE — ED Provider Notes (Signed)
Medical screening examination/treatment/procedure(s) were performed by non-physician practitioner and as supervising physician I was immediately available for consultation/collaboration.   EKG Interpretation None       Kalman Drape, MD 07/02/14 [phone]

## 2014-07-02 NOTE — ED Notes (Signed)
Pt reporting that she is going to be unable to ride via [facility] and will only drive herself to [facility] to be admitted b/c girlfriend at bedside cannot drive at night and is having some anxiety over having to drive.  Informed of reason for transfer via [facility], pt stating she will have to leave against physician's advice if necessary.  MD notified.

## 2014-07-02 NOTE — Progress Notes (Signed)
ANTIBIOTIC CONSULT NOTE - INITIAL  Pharmacy Consult for Vancocin and Maxipime Indication: pneumonia  Allergies  Allergen Reactions  . Zyrtec [Cetirizine Hcl] Shortness Of Breath    Throat swelling  . Allegra [Fexofenadine] Other (See Comments)    Facial and head swelling.  . Benadryl [Diphenhydramine Hcl] Hives  . Chantix [Varenicline]     Agitated, anxious  . Claritin [Loratadine] Swelling    Facial swelling  . Clindamycin/Lincomycin Hives  . Amoxicillin Rash  . Lamictal [Lamotrigine] Rash  . Silicone Itching and Rash    Patient Measurements: Height: 4' 9.75" (146.7 cm) Weight: 145 lb (65.772 kg) IBW/kg (Calculated) : 40.33  Vital Signs: Temp: 97.7 F (36.5 C) (08/02 0435) Temp src: Oral (08/02 0435) BP: 107/66 mmHg (08/02 0615) Pulse Rate: 90 (08/02 0615)  Labs:  Recent Labs  07/01/14 2359  WBC 17.1*  HGB 13.5  PLT 220  CREATININE 0.60   Estimated Creatinine Clearance: 70.8 ml/min (by C-G formula based on Cr of 0.6).    Medical History: Past Medical History  Diagnosis Date  . Interstitial cystitis   . Bipolar 1 disorder   . Acid reflux   . Hypothyroidism   . Hypothyroid   . DDD (degenerative disc disease)   . Psoriasis   . Asthma   . Pneumonia     Medications:  Prescriptions prior to admission  Medication Sig Dispense Refill  . ABILIFY 15 MG tablet Take 15 mg by mouth every evening.       Marland Kitchen albuterol-ipratropium (COMBIVENT) 18-103 MCG/ACT inhaler Inhale 1 puff into the lungs every 6 (six) hours as needed for wheezing or shortness of breath.      . carbamazepine (EQUETRO) 200 MG CP12 12 hr capsule Take 200 mg by mouth daily.      Marland Kitchen desmopressin (DDAVP) 0.2 MG tablet Take 0.4 mg by mouth at bedtime.       Marland Kitchen desvenlafaxine (PRISTIQ) 100 MG 24 hr tablet Take 100 mg by mouth daily.      Marland Kitchen doxepin (SINEQUAN) 50 MG capsule Take 50-100 mg by mouth at bedtime as needed (for sleep).      Marland Kitchen esomeprazole (NEXIUM) 40 MG capsule Take 40 mg by mouth daily  before breakfast.      . fenofibrate micronized (LOFIBRA) 134 MG capsule Take 134 mg by mouth daily.      Marland Kitchen gabapentin (NEURONTIN) 100 MG capsule Take 100-200 mg by mouth See admin instructions. Take 1 capsule in the morning, 1 capsule at noon and 2 capsules at bedtime.      Marland Kitchen ibuprofen (ADVIL,MOTRIN) 200 MG tablet Take 400 mg by mouth every 6 (six) hours as needed for moderate pain.       Marland Kitchen levothyroxine (SYNTHROID, LEVOTHROID) 50 MCG tablet Take 50 mcg by mouth daily.      Marland Kitchen LORazepam (ATIVAN) 1 MG tablet Take 1 mg by mouth every 8 (eight) hours as needed for anxiety.       . meloxicam (MOBIC) 15 MG tablet Take 15 mg by mouth daily.       . ondansetron (ZOFRAN-ODT) 4 MG disintegrating tablet Take 4 mg by mouth every 6 (six) hours as needed for nausea or vomiting.      Marland Kitchen oxyCODONE-acetaminophen (PERCOCET) 10-325 MG per tablet Take 1 tablet by mouth 2 (two) times daily as needed for pain (May take 0.5 tablet in the moning).      Marland Kitchen oxymorphone (OPANA ER) 15 MG 12 hr tablet Take 15 mg by mouth every 12 (  twelve) hours.      Marland Kitchen Specialty Vitamins Products (CENTRUM SPECIALIST ENERGY PO) Take 1 tablet by mouth daily.      . temazepam (RESTORIL) 30 MG capsule Take 30-60 mg by mouth at bedtime as needed for sleep.      Marland Kitchen tiZANidine (ZANAFLEX) 4 MG tablet Take 4 mg by mouth 3 (three) times daily as needed for muscle spasms.      Marland Kitchen albuterol (PROVENTIL HFA;VENTOLIN HFA) 108 (90 BASE) MCG/ACT inhaler Inhale 2 puffs into the lungs every 4 (four) hours as needed for wheezing or shortness of breath.  1 Inhaler  0   Scheduled:  . ARIPiprazole  15 mg Oral QPM  . azithromycin  500 mg Intravenous Q24H  . carbamazepine  200 mg Oral Daily  . desmopressin  0.4 mg Oral QHS  . doxepin  25-50 mg Oral QHS  . enoxaparin (LOVENOX) injection  40 mg Subcutaneous Q24H  . gabapentin  100 mg Oral Daily  . gabapentin  300 mg Oral QHS  . levothyroxine  50 mcg Oral QAC breakfast  . meloxicam  15 mg Oral Daily  . morphine  30  mg Oral Q12H  . nicotine  21 mg Transdermal Daily  . pantoprazole  40 mg Oral Daily  . tiZANidine  4 mg Oral QHS  . venlafaxine XR  150 mg Oral Q breakfast   Infusions:  . sodium chloride      Assessment: 43yo female was recently tx'd w/ Z-pak for PNA and was feeling better until 2d ago when she developed fever and cough w/ mucus production and myalgias, CT reveals patchy ground-glass infiltrates in both lungs likely representing PNA, to begin IV ABX.  Goal of Therapy:  Vancomycin trough level 15-20 mcg/ml  Plan:  Rec'd Levaquin in ED; will begin vancomycin 750mg  IV Q12H and cefepime 1g IV Q12H and monitor CBC, Cx, levels prn.  Wynona Neat, PharmD, BCPS  07/02/2014,6:43 AM

## 2014-07-02 NOTE — ED Notes (Signed)
Pt discharged AMA. States she is going to drive herself to [facility] for admission and further treatment.

## 2014-07-02 NOTE — ED Notes (Signed)
I notified nurse of BP 95/50.

## 2014-07-02 NOTE — ED Notes (Signed)
The pt has been coughing more with lowgrade temp coughing.  Chest xray   At med center taken tonight

## 2014-07-02 NOTE — Progress Notes (Signed)
ANTIBIOTIC CONSULT NOTE - INITIAL  Pharmacy Consult for levaquin Indication: pneumonia  Allergies  Allergen Reactions  . Zyrtec [Cetirizine Hcl] Shortness Of Breath    Throat swelling  . Allegra [Fexofenadine] Other (See Comments)    Facial and head swelling.  . Benadryl [Diphenhydramine Hcl] Hives  . Chantix [Varenicline]     Agitated, anxious  . Claritin [Loratadine] Swelling    Facial swelling  . Clindamycin/Lincomycin Hives  . Amoxicillin Rash  . Lamictal [Lamotrigine] Rash  . Silicone Itching and Rash    Patient Measurements: Height: 4' 9.75" (146.7 cm) Weight: 154 lb 1.6 oz (69.9 kg) IBW/kg (Calculated) : 40.33  Vital Signs: Temp: 98.3 F (36.8 C) (08/02 1326) Temp src: Oral (08/02 1326) BP: 107/71 mmHg (08/02 1326) Pulse Rate: 83 (08/02 1326)  Labs:  Recent Labs  07/01/14 2359 07/02/14 0705  WBC 17.1* 13.2*  HGB 13.5 12.6  PLT 220 181  CREATININE 0.60 0.54   Estimated Creatinine Clearance: 73 ml/min (by C-G formula based on Cr of 0.54).    Medical History: Past Medical History  Diagnosis Date  . Interstitial cystitis   . Bipolar 1 disorder   . Acid reflux   . Hypothyroidism   . Hypothyroid   . DDD (degenerative disc disease)   . Psoriasis   . Asthma   . Pneumonia     Medications:  Prescriptions prior to admission  Medication Sig Dispense Refill  . ABILIFY 15 MG tablet Take 15 mg by mouth every evening.       Marland Kitchen albuterol-ipratropium (COMBIVENT) 18-103 MCG/ACT inhaler Inhale 1 puff into the lungs every 6 (six) hours as needed for wheezing or shortness of breath.      . carbamazepine (EQUETRO) 200 MG CP12 12 hr capsule Take 200 mg by mouth daily.      Marland Kitchen desmopressin (DDAVP) 0.2 MG tablet Take 0.4 mg by mouth at bedtime.       Marland Kitchen desvenlafaxine (PRISTIQ) 100 MG 24 hr tablet Take 100 mg by mouth daily.      Marland Kitchen doxepin (SINEQUAN) 50 MG capsule Take 50-100 mg by mouth at bedtime as needed (for sleep).      Marland Kitchen esomeprazole (NEXIUM) 40 MG capsule Take  40 mg by mouth daily before breakfast.      . fenofibrate micronized (LOFIBRA) 134 MG capsule Take 134 mg by mouth daily.      Marland Kitchen gabapentin (NEURONTIN) 100 MG capsule Take 100-200 mg by mouth See admin instructions. Take 1 capsule in the morning, 1 capsule at noon and 2 capsules at bedtime.      Marland Kitchen ibuprofen (ADVIL,MOTRIN) 200 MG tablet Take 400 mg by mouth every 6 (six) hours as needed for moderate pain.       Marland Kitchen levothyroxine (SYNTHROID, LEVOTHROID) 50 MCG tablet Take 50 mcg by mouth daily.      Marland Kitchen LORazepam (ATIVAN) 1 MG tablet Take 1 mg by mouth every 8 (eight) hours as needed for anxiety.       . meloxicam (MOBIC) 15 MG tablet Take 15 mg by mouth daily.       . ondansetron (ZOFRAN-ODT) 4 MG disintegrating tablet Take 4 mg by mouth every 6 (six) hours as needed for nausea or vomiting.      Marland Kitchen oxyCODONE-acetaminophen (PERCOCET) 10-325 MG per tablet Take 1 tablet by mouth 2 (two) times daily as needed for pain (May take 0.5 tablet in the moning).      Marland Kitchen oxymorphone (OPANA ER) 15 MG 12 hr tablet Take  15 mg by mouth every 12 (twelve) hours.      Marland Kitchen Specialty Vitamins Products (CENTRUM SPECIALIST ENERGY PO) Take 1 tablet by mouth daily.      . temazepam (RESTORIL) 30 MG capsule Take 30-60 mg by mouth at bedtime as needed for sleep.      Marland Kitchen tiZANidine (ZANAFLEX) 4 MG tablet Take 4 mg by mouth 3 (three) times daily as needed for muscle spasms.      Marland Kitchen albuterol (PROVENTIL HFA;VENTOLIN HFA) 108 (90 BASE) MCG/ACT inhaler Inhale 2 puffs into the lungs every 4 (four) hours as needed for wheezing or shortness of breath.  1 Inhaler  0   Scheduled:  . ARIPiprazole  15 mg Oral QPM  . carbamazepine  200 mg Oral Daily  . carbamazepine  100 mg Oral BID  . desmopressin  0.4 mg Oral QHS  . doxepin  25-50 mg Oral QHS  . enoxaparin (LOVENOX) injection  40 mg Subcutaneous Q24H  . gabapentin  100 mg Oral Daily  . gabapentin  300 mg Oral QHS  . levofloxacin  750 mg Oral Q24H  . levothyroxine  50 mcg Oral QAC breakfast   . meloxicam  15 mg Oral Daily  . morphine  30 mg Oral Q12H  . nicotine  21 mg Transdermal Daily  . pantoprazole  40 mg Oral Daily  . tiZANidine  4 mg Oral QHS  . venlafaxine XR  150 mg Oral Q breakfast   Infusions:  . sodium chloride 50 mL/hr at 07/02/14 4193    Assessment: Abx is now being changed to levaquin for possible dced in AM. Her renal function is good so dose will not need to be changed further.    Plan:   Dc vanc/cefepime Levaquin 750mg  PO qday

## 2014-07-02 NOTE — Progress Notes (Signed)
Pt arrive to unit A&Ox4. Pt in no s/s of distress. Pt oriented to unit and room. Significant other at bedside. VS stable. Tele applied. Whiteboard updated. Report received from ED nurse prior to pt's arrival. Callbell within reach. Will continue to monitor.

## 2014-07-02 NOTE — ED Notes (Signed)
The pt was diagnosed with pneumonia 2 weeks ago.  Symptoms worse for the past 3-4 days.  She was sent here from med center to be admitted with pneumonia.  Elevated temp

## 2014-07-02 NOTE — Progress Notes (Addendum)
[facility]  Patient admitted early this AM. I have evaluated her. She has cough but is not significantly hypoxic. Recently was treated with a Z pak and states she improved completely but became sick again about 3-4 days ago, starting with a cough and then developed a fever on the day before admission. CT reveals pneumonia picture.    A/P CAP -Continue antibiotics-  Pulm suggesting to treat as CAP no h/o or current suspicion for ILD- will switch to Levaquin rather than Rocephin and Zithromax. Possibly home in AM.   Dehydration - given IVF- will d/c now  Smoker - advised to quit  Bipolar disorder - cont home meds  Asthma - cont Combivent  Hypothyroid - cont Levothyroxine   Debbe Odea, MD

## 2014-07-02 NOTE — ED Notes (Signed)
MD at bedside to speak with pt regarding leaving [facility] and transferring to [facility] via [facility].  Pt reporting she will leave [facility] but will drive straight to [facility] following taking her girlfriend home after discharge.  Per md will continue levaquin and allow pt to sign out once this is completed.  Pt in agreement with plan.

## 2014-07-02 NOTE — ED Provider Notes (Signed)
CSN: [id number]     Arrival date & time 07/02/14  5681 History   First MD Initiated Contact with Patient 07/02/14 0449     Chief Complaint  Patient presents with  . Pneumonia     (Consider location/radiation/quality/duration/timing/severity/associated sxs/prior Treatment) Patient is a 43 y.o. female presenting with pneumonia. The history is provided by the patient and medical records. No language interpreter was used.  Pneumonia Associated symptoms include chills, coughing, a fever and myalgias. Pertinent negatives include no abdominal pain, chest pain, diaphoresis, fatigue, headaches, nausea, rash or vomiting.    KYIESHA MILLWARD is a 43 y.o. female  with a hx of COPD, pulmonary fibrosis (hx of resp failure and subsequent intubation) presents to the Emergency Department complaining of gradual, persistent, progressively worsening cough, myalgias, low grade fevers.  Pt diagnosed with CAP 2 weeks ago and has been taking abx with initial relief, but symptoms returned 2 days ago.  Pt was seen earlier tonight for same and had to leave AMA to take her significant other home after admission by Dr. Roel Cluck. No OTC treatments tried.  No aggravating or alleviating factors.  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      Past Medical History  Diagnosis Date  . Interstitial cystitis   . Bipolar 1 disorder   . Acid reflux   . Hypothyroidism   . Hypothyroid   . DDD (degenerative disc disease)   . Psoriasis   . Asthma   . Pneumonia    Past Surgical History  Procedure Laterality Date  . Total knee arthroplasty      x15  . Foot surgery    . Bladder pacemaker      x6   No family history on file. History  Substance Use Topics  . Smoking status: Current Every Day Smoker -- 1.00 packs/day for 29 years    Types: Cigarettes  . Smokeless tobacco: Never Used  . Alcohol Use: No   OB History   Grav Para Term Preterm Abortions TAB SAB Ect Mult Living                  Review of Systems  Constitutional: Positive for fever and chills. Negative for diaphoresis, appetite change, fatigue and unexpected weight change.  HENT: Negative for mouth sores.   Eyes: Negative for visual disturbance.  Respiratory: Positive for cough and shortness of breath. Negative for chest tightness and wheezing.   Cardiovascular: Negative for chest pain.  Gastrointestinal: Negative for nausea, vomiting, abdominal pain, diarrhea and constipation.  Endocrine: Negative for polydipsia, polyphagia and polyuria.  Genitourinary: Negative for dysuria, urgency, frequency and hematuria.  Musculoskeletal: Positive for myalgias. Negative for back pain and neck stiffness.  Skin: Negative for rash.  Allergic/Immunologic: Negative for immunocompromised state.  Neurological: Negative for syncope, light-headedness and headaches.  Hematological: Does not bruise/bleed easily.  Psychiatric/Behavioral: Negative for sleep disturbance. The patient is not nervous/anxious.       Allergies  Zyrtec; Allegra; Benadryl; Chantix; Claritin; Clindamycin/lincomycin; Amoxicillin; Lamictal; and Silicone  Home Medications   Prior to Admission medications   Medication Sig Start Date End Date Taking? Authorizing Provider  ABILIFY 15 MG tablet Take 15 mg by mouth every evening.  09/07/13   Historical Provider, MD  albuterol (PROVENTIL HFA;VENTOLIN HFA) 108 (90 BASE) MCG/ACT inhaler Inhale 2 puffs into the lungs every 4 (four) hours as needed for wheezing or shortness of breath. 06/06/14   Nicole Pisciotta, PA-C  azithromycin [facility]  Z-PAK) 250 MG tablet 2 po day one, then 1 daily x 4 days 06/06/14   Elmyra Ricks Pisciotta, PA-C  desmopressin (DDAVP) 0.2 MG tablet Take 0.4 mg by mouth at bedtime.     Historical Provider, MD  desvenlafaxine (PRISTIQ) 100 MG 24 hr tablet Take 100 mg by mouth daily.    Historical Provider, MD  doxepin (SINEQUAN) 25 MG capsule Take 25-50 mg by mouth at bedtime.  09/15/13    Historical Provider, MD  doxycycline (VIBRAMYCIN) 100 MG capsule Take 1 capsule (100 mg total) by mouth 2 (two) times daily. One po bid x 7 days 06/06/14   Elmyra Ricks Pisciotta, PA-C  esomeprazole (NEXIUM) 40 MG capsule Take 40 mg by mouth daily before breakfast.    Historical Provider, MD  gabapentin (NEURONTIN) 100 MG capsule Take 100-300 mg by mouth See admin instructions. 100mg  in the AM 300 mg in the evening    Historical Provider, MD  HYDROcodone-acetaminophen (HYCET) 7.5-325 mg/15 ml solution Take 15 mLs by mouth every 4 (four) hours as needed for moderate pain or severe pain. 06/06/14   Nicole Pisciotta, PA-C  ibuprofen (ADVIL,MOTRIN) 200 MG tablet Take 400 mg by mouth every 6 (six) hours as needed for moderate pain.     Historical Provider, MD  LamoTRIgine (LAMICTAL PO) Take 1 tablet by mouth at bedtime.    Historical Provider, MD  levothyroxine (SYNTHROID, LEVOTHROID) 50 MCG tablet Take 50 mcg by mouth daily.    Historical Provider, MD  LORazepam (ATIVAN) 1 MG tablet Take 1 mg by mouth every 8 (eight) hours as needed for anxiety.     Historical Provider, MD  meloxicam (MOBIC) 15 MG tablet Take 15 mg by mouth daily.  09/05/13   Historical Provider, MD  oxyCODONE-acetaminophen (PERCOCET) 10-325 MG per tablet Take 0.5-1 tablets by mouth 2 (two) times daily. 0.5 tab in the morning, 1 around noon    Historical Provider, MD  oxymorphone (OPANA ER) 15 MG 12 hr tablet Take 15 mg by mouth every 12 (twelve) hours.    Historical Provider, MD  Specialty Vitamins Products (CENTRUM SPECIALIST ENERGY PO) Take 1 tablet by mouth daily.    Historical Provider, MD  temazepam (RESTORIL) 30 MG capsule Take 60 mg by mouth at bedtime as needed for sleep.  09/03/13   Historical Provider, MD  tiZANidine (ZANAFLEX) 4 MG tablet Take 4 mg by mouth at bedtime.    Historical Provider, MD   BP 114/64  Pulse 87  Temp(Src) 97.7 F (36.5 C) (Oral)  Resp 16  Ht 4' 9.75" (1.467 m)  Wt 145 lb (65.772 kg)  BMI 30.56 kg/m2  SpO2  98%  LMP 04/05/2014 Physical Exam  Nursing note and vitals reviewed. Constitutional: She appears well-developed and well-nourished. No distress.  Awake, alert, nontoxic appearance  HENT:  Head: Normocephalic and atraumatic.  Mouth/Throat: Oropharynx is clear and moist. No oropharyngeal exudate.  Eyes: Conjunctivae are normal. No scleral icterus.  Neck: Normal range of motion. Neck supple.  Cardiovascular: Normal rate, regular rhythm, normal heart sounds and intact distal pulses.   No murmur heard. Pulmonary/Chest: Effort normal and breath sounds normal. No respiratory distress. She has no wheezes.  Diminished throughout No focal wheezes, rhonchi or rails  Abdominal: Soft. Bowel sounds are normal. She exhibits no mass. There is no tenderness. There is no rebound and no guarding.  Musculoskeletal: Normal range of motion. She exhibits no edema.  Neurological: She is alert.  Speech is clear and goal oriented Moves extremities without ataxia  Skin:  Skin is warm and dry. She is not diaphoretic.  Psychiatric: She has a normal mood and affect.    ED Course  Procedures (including critical care time) Labs Review Labs Reviewed - No data to display  Imaging Review Dg Chest 2 View  07/01/2014   CLINICAL DATA:  Cough, body aches, low-grade temperature. Previous pneumonia 2 weeks ago. Symptoms resolved and now have recurred.  EXAM: CHEST  2 VIEW  COMPARISON:  06/05/2014  FINDINGS: Normal heart size and pulmonary vascularity. Persistent perihilar interstitial changes consistent with bronchiolitis or reactive airways disease. Persistent vague nodular infiltrative changes in the left mid lung, similar prior study. Given persistence of these findings, suggest follow-up imaging after resolution of acute process to exclude developing pulmonary nodules. No blunting of costophrenic angles. No pneumothorax.  IMPRESSION: Persistent perihilar interstitial changes consistent with bronchiolitis or reactive  airways disease. Persistent vague nodular infiltrative changes in the left mid lung, similar to prior study. Findings likely represent residual or recurrent infection, but given persistence of findings, follow-up imaging after resolution of acute process is recommended to exclude developing pulmonary nodules.   Electronically Signed   By: Lucienne Capers M.D.   On: 07/01/2014 22:40   Ct Angio Chest W/cm &/or Wo Cm  07/02/2014   CLINICAL DATA:  Diagnosed with pneumonia 2 weeks ago. Recurrence of symptoms with low-grade temperature and body aches. Cough.  EXAM: CT ANGIOGRAPHY CHEST WITH CONTRAST  TECHNIQUE: Multidetector CT imaging of the chest was performed using the standard protocol during bolus administration of intravenous contrast. Multiplanar CT image reconstructions and MIPs were obtained to evaluate the vascular anatomy.  CONTRAST:  111mL OMNIPAQUE IOHEXOL 350 MG/ML SOLN  COMPARISON:  06/28/2009  FINDINGS: Patchy mostly perihilar ground-glass infiltrates demonstrated in both upper lungs and right middle lung/ left lingula. Changes are likely to be due to multifocal pneumonia although other etiologies such as hemorrhage of edema, atypical infection, or infiltrating neoplasm could also have this appearance. Followup in 3 months after resolution of acute process is recommended to exclude underlying chronic process. Similar infiltrates were demonstrated on the previous study. Mild enlargement of bilateral hilar and mediastinal lymph nodes, likely reactive. No pleural effusion. No pneumothorax.  Normal heart size. Normal caliber thoracic aorta. No evidence of aneurysm. Central pulmonary arteries are well opacified without evidence of pulmonary embolus. Esophagus is mostly decompressed. Lower esophageal wall demonstrates mild thickening which may be due to reflux changes.  Visualized portions of the upper abdominal organs demonstrate prominent diffuse fatty infiltration of the liver. No destructive bone lesions  are appreciated.  Review of the MIP images confirms the above findings.  IMPRESSION: Patchy ground-glass infiltrates in both lungs. Changes likely represent multifocal pneumonia although other etiologies are also considered. Recommend followup in 3 months after resolution of acute process to exclude underlying chronic abnormality. Borderline lymphadenopathy in the mediastinum and hilar regions, likely reactive. Diffuse fatty infiltration of the liver.   Electronically Signed   By: Lucienne Capers M.D.   On: 07/02/2014 01:16     EKG Interpretation None      MDM   Final diagnoses:  Community acquired pneumonia  Cough  Leukocytosis    [place] A Santy presents with recent history of pneumonia with recurrence of low-grade fevers, myalgias and cough. Patient was evaluated earlier this evening at mid [facility] by Dr. Joneen Caraway and admitted by Dr. due to the due to her family situation needed to leave AMA. She presents here to emergency department for admission.  On exam patient with diminished breath sounds. Leukocytosis and CT scan with Patchy ground-glass infiltrates in both lungs. Changes likely represent multifocal pneumonia although other etiologies are also considered.  Patient has now failed outpatient treatment at this time.  Patient with history of pulmonary fibrosis, respiratory failure and subsequent intubations however patient does not appear to be in significant distress at this time.  Patient with normal oxygen saturations and afebrile on arrival here in the emergency department.  Will consult triad for admission.  BP 114/64  Pulse 87  Temp(Src) 97.7 F (36.5 C) (Oral)  Resp 16  Ht 4' 9.75" (1.467 m)  Wt 145 lb (65.772 kg)  BMI 30.56 kg/m2  SpO2 98%  LMP 04/05/2014    5:52 AM Discussed with Dr. Roel Cluck who will reassess for admission.      Jarrett Soho Kinsie Belford, PA-C 07/02/14 [phone]

## 2014-07-02 NOTE — Consult Note (Signed)
Name: [name] MRN: [id number] DOB: 08-Jan-1971    ADMISSION DATE:  07/02/2014 CONSULTATION DATE:  07/02/2014   REFERRING MD :  [facility]  PRIMARY SERVICE:  TRH  CHIEF COMPLAINT:   Cough and shortness of breath  BRIEF PATIENT DESCRIPTION:  43 year old female active smoker with previous history of smoking-induced tracheobronchitis in a history of recurrent pneumonia. The patient was admitted by triad hospitalist for bilateral atypical pattern and pneumonia. Pulmonary was consult and  SIGNIFICANT EVENTS / STUDIES:  CT scan of the chest 07/02/2014 shows bilateral groundglass opacifications compatible with patchy bilateral atypical community acquired pneumonia without evidence of background interstitial lung disease  LINES / TUBES: PIV  CULTURES: Strep pneumoniae antigen 07/02/14 Legionella urine antigen 07/02/14 Sputum culture 07/02/2014  ANTIBIOTICS: Vancomycin 07/02/2014 Cefepime 07/02/2014 Azithromycin 07/02/2014  HISTORY OF PRESENT ILLNESS:  43 year old female active smoker with previous history of smoking-induced tracheobronchitis in a history of recurrent pneumonia. Previous lung function testing in 2011 showed normal lung function. Previous CT scan in chest x-rays did not show interstitial lung disease. The patient was on as needed Combivent.  The patient continues to smoke. The patient was previously been seen by Dr. Melvyn Novas twice in January and February 2013 and monos to quit smoking. Tablet patient has not been able to follow through on this. There has been a history of previous recurrent pneumonia.  The patient presents with several day history of increasing cough shortness of breath and low-grade fever. The patient progressed to point where she was brought to the hospital for inpatient care. Chest x-ray and CT scan the chest shows bilateral patchy groundglass opacifications   PAST MEDICAL HISTORY :  Past Medical History  Diagnosis Date  . Interstitial cystitis   . Bipolar  1 disorder   . Acid reflux   . Hypothyroidism   . Hypothyroid   . DDD (degenerative disc disease)   . Psoriasis   . Asthma   . Pneumonia    Past Surgical History  Procedure Laterality Date  . Total knee arthroplasty      x15  . Foot surgery    . Bladder pacemaker      x6   Prior to Admission medications   Medication Sig Start Date End Date Taking? Authorizing Provider  ABILIFY 15 MG tablet Take 15 mg by mouth every evening.  09/07/13  Yes Historical Provider, MD  albuterol-ipratropium (COMBIVENT) 18-103 MCG/ACT inhaler Inhale 1 puff into the lungs every 6 (six) hours as needed for wheezing or shortness of breath.   Yes Historical Provider, MD  carbamazepine (EQUETRO) 200 MG CP12 12 hr capsule Take 200 mg by mouth daily.   Yes Historical Provider, MD  desmopressin (DDAVP) 0.2 MG tablet Take 0.4 mg by mouth at bedtime.    Yes Historical Provider, MD  desvenlafaxine (PRISTIQ) 100 MG 24 hr tablet Take 100 mg by mouth daily.   Yes Historical Provider, MD  doxepin (SINEQUAN) 50 MG capsule Take 50-100 mg by mouth at bedtime as needed (for sleep).   Yes Historical Provider, MD  esomeprazole (NEXIUM) 40 MG capsule Take 40 mg by mouth daily before breakfast.   Yes Historical Provider, MD  fenofibrate micronized (LOFIBRA) 134 MG capsule Take 134 mg by mouth daily.   Yes Historical Provider, MD  gabapentin (NEURONTIN) 100 MG capsule Take 100-200 mg by mouth See admin instructions. Take 1 capsule in the morning, 1 capsule at noon and 2 capsules at bedtime.   Yes Historical Provider, MD  ibuprofen (ADVIL,MOTRIN) 200 MG  tablet Take 400 mg by mouth every 6 (six) hours as needed for moderate pain.    Yes Historical Provider, MD  levothyroxine (SYNTHROID, LEVOTHROID) 50 MCG tablet Take 50 mcg by mouth daily.   Yes Historical Provider, MD  LORazepam (ATIVAN) 1 MG tablet Take 1 mg by mouth every 8 (eight) hours as needed for anxiety.    Yes Historical Provider, MD  meloxicam (MOBIC) 15 MG tablet Take 15 mg  by mouth daily.  09/05/13  Yes Historical Provider, MD  ondansetron (ZOFRAN-ODT) 4 MG disintegrating tablet Take 4 mg by mouth every 6 (six) hours as needed for nausea or vomiting.   Yes Historical Provider, MD  oxyCODONE-acetaminophen (PERCOCET) 10-325 MG per tablet Take 1 tablet by mouth 2 (two) times daily as needed for pain (May take 0.5 tablet in the moning).   Yes Historical Provider, MD  oxymorphone (OPANA ER) 15 MG 12 hr tablet Take 15 mg by mouth every 12 (twelve) hours.   Yes Historical Provider, MD  Specialty Vitamins Products (CENTRUM SPECIALIST ENERGY PO) Take 1 tablet by mouth daily.   Yes Historical Provider, MD  temazepam (RESTORIL) 30 MG capsule Take 30-60 mg by mouth at bedtime as needed for sleep.   Yes Historical Provider, MD  tiZANidine (ZANAFLEX) 4 MG tablet Take 4 mg by mouth 3 (three) times daily as needed for muscle spasms.   Yes Historical Provider, MD  albuterol (PROVENTIL HFA;VENTOLIN HFA) 108 (90 BASE) MCG/ACT inhaler Inhale 2 puffs into the lungs every 4 (four) hours as needed for wheezing or shortness of breath. 06/06/14   Nicole Pisciotta, PA-C   Allergies  Allergen Reactions  . Zyrtec [Cetirizine Hcl] Shortness Of Breath    Throat swelling  . Allegra [Fexofenadine] Other (See Comments)    Facial and head swelling.  . Benadryl [Diphenhydramine Hcl] Hives  . Chantix [Varenicline]     Agitated, anxious  . Claritin [Loratadine] Swelling    Facial swelling  . Clindamycin/Lincomycin Hives  . Amoxicillin Rash  . Lamictal [Lamotrigine] Rash  . Silicone Itching and Rash    FAMILY HISTORY:  Family History  Problem Relation Age of Onset  . Dementia Mother   . Diabetes type II Sister   . Alcoholism Sister    SOCIAL HISTORY:  reports that she has been smoking Cigarettes.  She has a 29 pack-year smoking history. She has never used smokeless tobacco. She reports that she does not drink alcohol or use illicit drugs.  REVIEW OF SYSTEMS:   Constitutional: Negative for  fever, chills, weight loss, malaise/fatigue and diaphoresis.  HENT: Negative for hearing loss, ear pain, nosebleeds, congestion, sore throat, neck pain, tinnitus and ear discharge.   Eyes: Negative for blurred vision, double vision, photophobia, pain, discharge and redness.  Respiratory: Negative for cough, hemoptysis, sputum production, shortness of breath, wheezing and stridor.   Cardiovascular: Negative for chest pain, palpitations, orthopnea, claudication, leg swelling and PND.  Gastrointestinal: Negative for heartburn, nausea, vomiting, abdominal pain, diarrhea, constipation, blood in stool and melena.  Genitourinary: Negative for dysuria, urgency, frequency, hematuria and flank pain.  Musculoskeletal: Negative for myalgias, back pain, joint pain and falls.  Skin: Negative for itching and rash.  Neurological: Negative for dizziness, tingling, tremors, sensory change, speech change, focal weakness, seizures, loss of consciousness, weakness and headaches.  Endo/Heme/Allergies: Negative for environmental allergies and polydipsia. Does not bruise/bleed easily.  SUBJECTIVE:   VITAL SIGNS: Temp:  [97.7 F (36.5 C)-98.8 F (37.1 C)] 98.8 F (37.1 C) (08/02 0639) Pulse Rate:  [  77-108] 77 (08/02 0639) Resp:  [16-24] 20 (08/02 0639) BP: (95-138)/(50-82) 107/74 mmHg (08/02 0639) SpO2:  [96 %-98 %] 96 % (08/02 0639) Weight:  [65.772 kg (145 lb)-69.9 kg (154 lb 1.6 oz)] 69.9 kg (154 lb 1.6 oz) (08/02 0109)  PHYSICAL EXAMINATION: General:  Well-developed well-nourished white female in no acute distress Neuro:  Awake and alert oriented x3 HEENT:  Moist mucous membranes no lesions no thyromegaly no jugular venous distention Cardiovascular:  Regular rate and rhythm without S3 normal sinus 2 no S3 or S4 no murmur rub heave or gallop Lungs:  Expired wheezes and patchy rales bilateral Abdomen:  Soft nontender bowel sounds active Musculoskeletal:  Full range of motion no joint deformity Skin:   Clear   Recent Labs Lab 07/01/14 2359 07/02/14 0705  NA 137  --   K 3.8  --   CL 98  --   CO2 24  --   BUN 13  --   CREATININE 0.60 0.54  GLUCOSE 167*  --     Recent Labs Lab 07/01/14 2359 07/02/14 0705  HGB 13.5 12.6  HCT 39.1 36.5  WBC 17.1* 13.2*  PLT 220 181   Dg Chest 2 View  07/01/2014   CLINICAL DATA:  Cough, body aches, low-grade temperature. Previous pneumonia 2 weeks ago. Symptoms resolved and now have recurred.  EXAM: CHEST  2 VIEW  COMPARISON:  06/05/2014  FINDINGS: Normal heart size and pulmonary vascularity. Persistent perihilar interstitial changes consistent with bronchiolitis or reactive airways disease. Persistent vague nodular infiltrative changes in the left mid lung, similar prior study. Given persistence of these findings, suggest follow-up imaging after resolution of acute process to exclude developing pulmonary nodules. No blunting of costophrenic angles. No pneumothorax.  IMPRESSION: Persistent perihilar interstitial changes consistent with bronchiolitis or reactive airways disease. Persistent vague nodular infiltrative changes in the left mid lung, similar to prior study. Findings likely represent residual or recurrent infection, but given persistence of findings, follow-up imaging after resolution of acute process is recommended to exclude developing pulmonary nodules.   Electronically Signed   By: Lucienne Capers M.D.   On: 07/01/2014 22:40   Ct Angio Chest W/cm &/or Wo Cm  07/02/2014   CLINICAL DATA:  Diagnosed with pneumonia 2 weeks ago. Recurrence of symptoms with low-grade temperature and body aches. Cough.  EXAM: CT ANGIOGRAPHY CHEST WITH CONTRAST  TECHNIQUE: Multidetector CT imaging of the chest was performed using the standard protocol during bolus administration of intravenous contrast. Multiplanar CT image reconstructions and MIPs were obtained to evaluate the vascular anatomy.  CONTRAST:  141mL OMNIPAQUE IOHEXOL 350 MG/ML SOLN  COMPARISON:  06/28/2009   FINDINGS: Patchy mostly perihilar ground-glass infiltrates demonstrated in both upper lungs and right middle lung/ left lingula. Changes are likely to be due to multifocal pneumonia although other etiologies such as hemorrhage of edema, atypical infection, or infiltrating neoplasm could also have this appearance. Followup in 3 months after resolution of acute process is recommended to exclude underlying chronic process. Similar infiltrates were demonstrated on the previous study. Mild enlargement of bilateral hilar and mediastinal lymph nodes, likely reactive. No pleural effusion. No pneumothorax.  Normal heart size. Normal caliber thoracic aorta. No evidence of aneurysm. Central pulmonary arteries are well opacified without evidence of pulmonary embolus. Esophagus is mostly decompressed. Lower esophageal wall demonstrates mild thickening which may be due to reflux changes.  Visualized portions of the upper abdominal organs demonstrate prominent diffuse fatty infiltration of the liver. No destructive bone lesions are appreciated.  Review of the MIP images confirms the above findings.  IMPRESSION: Patchy ground-glass infiltrates in both lungs. Changes likely represent multifocal pneumonia although other etiologies are also considered. Recommend followup in 3 months after resolution of acute process to exclude underlying chronic abnormality. Borderline lymphadenopathy in the mediastinum and hilar regions, likely reactive. Diffuse fatty infiltration of the liver.   Electronically Signed   By: Lucienne Capers M.D.   On: 07/02/2014 01:16    ASSESSMENT / PLAN: Principal Problem:   CAP (community acquired pneumonia) Active Problems:   SMOKER   ASTHMA   PULMONARY FIBROSIS ILD POST INFLAMMATORY CHRONIC>> note that Dr. Melvyn Novas at  last office visit he refuted this patient had interstitial lung disease however the problem remains on the patient's problem list   Nicotine dependence   Cough   #1 bilateral patchy  groundglass opacifications consistent with many acquired pneumonia with an atypical pattern  Plan   This patient should be able to narrow coverage with   antibiotics to Rocephin and azithromycin in short order    All microbiologic data and urine antigens  #2 active smoking use  Plan   The patient was advised against smoking and will need   nicotine replacement therapy  #3 I doubt this patient has interstitial lung disease a new baseline will have to be established with CT scanning and pulmonary function studies as an outpatient once current pneumonic process resolves  Plan   No additional studies are indicated at this time until the   pneumonia resolves  Pulmonary will continue to follow this patient status while in hospital Upon discharge the patient will be referred back to Dr. Christinia Gully for outpatient pulmonary followup to  reestablish  [facility]  [phone]  Cell  [phone]  If no response or cell goes to voicemail, call beeper [phone]  Pulmonary and [place] Pager: [phone]  07/02/2014, 9:07 AM

## 2014-07-02 NOTE — H&P (Signed)
PCP:  Andria Frames, MD    Chief Complaint:  Cough and fever  HPI: [name] is a 43 y.o. female   has a past medical history of Interstitial cystitis; Bipolar 1 disorder; Acid reflux; Hypothyroidism; Hypothyroid; DDD (degenerative disc disease); Psoriasis; Asthma; and Pneumonia.   Presented with 2 week sago patient was diagnosed with PNA and treated with z-pack. She was initially doing much better. 2 days ago she had an episode of getting wet and cold after that she developed fever and cough. She reports some mucus production. She has been having myalgias. Denies any chest pain. Patient presented to [facility] and was found to have bilateral patchy infiltrate. In November she had a PNA was was seen in the office by Dr. Melvyn Novas. She may have pulmonary fibrosis.   Hospitalist was called for admission for  CAP  Review of Systems:    Pertinent positives include:  Fevers, chills, fatigue, shortness of breath at rest.  productive cough  Constitutional:  No weight loss, night sweats,  weight loss  HEENT:  No headaches, Difficulty swallowing,Tooth/dental problems,Sore throat,  No sneezing, itching, ear ache, nasal congestion, post nasal drip,  Cardio-vascular:  No chest pain, Orthopnea, PND, anasarca, dizziness, palpitations.no Bilateral lower extremity swelling  GI:  No heartburn, indigestion, abdominal pain, nausea, vomiting, diarrhea, change in bowel habits, loss of appetite, melena, blood in stool, hematemesis Resp:  no No dyspnea on exertion, No excess mucus, no, No non-productive cough, No coughing up of blood.No change in color of mucus.No wheezing. Skin:  no rash or lesions. No jaundice GU:  no dysuria, change in color of urine, no urgency or frequency. No straining to urinate.  No flank pain.  Musculoskeletal:  No joint pain or no joint swelling. No decreased range of motion. No back pain.  Psych:  No change in mood or affect. No depression or anxiety. No memory loss.    Neuro: no localizing neurological complaints, no tingling, no weakness, no double vision, no gait abnormality, no slurred speech, no confusion  Otherwise ROS are negative except for above, 10 systems were reviewed  Past Medical History: Past Medical History  Diagnosis Date  . Interstitial cystitis   . Bipolar 1 disorder   . Acid reflux   . Hypothyroidism   . Hypothyroid   . DDD (degenerative disc disease)   . Psoriasis   . Asthma   . Pneumonia    Past Surgical History  Procedure Laterality Date  . Total knee arthroplasty      x15  . Foot surgery    . Bladder pacemaker      x6     Medications: Prior to Admission medications   Medication Sig Start Date End Date Taking? Authorizing Provider  carbamazepine (EQUETRO) 200 MG CP12 12 hr capsule Take 200 mg by mouth daily.   Yes Historical Provider, MD  ABILIFY 15 MG tablet Take 15 mg by mouth every evening.  09/07/13   Historical Provider, MD  albuterol (PROVENTIL HFA;VENTOLIN HFA) 108 (90 BASE) MCG/ACT inhaler Inhale 2 puffs into the lungs every 4 (four) hours as needed for wheezing or shortness of breath. 06/06/14   Nicole Pisciotta, PA-C  azithromycin (ZITHROMAX Z-PAK) 250 MG tablet 2 po day one, then 1 daily x 4 days 06/06/14   Elmyra Ricks Pisciotta, PA-C  desmopressin (DDAVP) 0.2 MG tablet Take 0.4 mg by mouth at bedtime.     Historical Provider, MD  desvenlafaxine (PRISTIQ) 100 MG 24 hr tablet Take 100 mg by mouth  daily.    Historical Provider, MD  doxepin (SINEQUAN) 25 MG capsule Take 25-50 mg by mouth at bedtime.  09/15/13   Historical Provider, MD  doxycycline (VIBRAMYCIN) 100 MG capsule Take 1 capsule (100 mg total) by mouth 2 (two) times daily. One po bid x 7 days 06/06/14   Elmyra Ricks Pisciotta, PA-C  esomeprazole (NEXIUM) 40 MG capsule Take 40 mg by mouth daily before breakfast.    Historical Provider, MD  gabapentin (NEURONTIN) 100 MG capsule Take 100-300 mg by mouth See admin instructions. 100mg  in the AM 300 mg in the evening     Historical Provider, MD  HYDROcodone-acetaminophen (HYCET) 7.5-325 mg/15 ml solution Take 15 mLs by mouth every 4 (four) hours as needed for moderate pain or severe pain. 06/06/14   Nicole Pisciotta, PA-C  ibuprofen (ADVIL,MOTRIN) 200 MG tablet Take 400 mg by mouth every 6 (six) hours as needed for moderate pain.     Historical Provider, MD  levothyroxine (SYNTHROID, LEVOTHROID) 50 MCG tablet Take 50 mcg by mouth daily.    Historical Provider, MD  LORazepam (ATIVAN) 1 MG tablet Take 1 mg by mouth every 8 (eight) hours as needed for anxiety.     Historical Provider, MD  meloxicam (MOBIC) 15 MG tablet Take 15 mg by mouth daily.  09/05/13   Historical Provider, MD  oxyCODONE-acetaminophen (PERCOCET) 10-325 MG per tablet Take 0.5-1 tablets by mouth 2 (two) times daily. 0.5 tab in the morning, 1 around noon    Historical Provider, MD  oxymorphone (OPANA ER) 15 MG 12 hr tablet Take 15 mg by mouth every 12 (twelve) hours.    Historical Provider, MD  Specialty Vitamins Products (CENTRUM SPECIALIST ENERGY PO) Take 1 tablet by mouth daily.    Historical Provider, MD  temazepam (RESTORIL) 30 MG capsule Take 60 mg by mouth at bedtime as needed for sleep.  09/03/13   Historical Provider, MD  tiZANidine (ZANAFLEX) 4 MG tablet Take 4 mg by mouth at bedtime.    Historical Provider, MD    Allergies:   Allergies  Allergen Reactions  . Zyrtec [Cetirizine Hcl] Shortness Of Breath    Throat swelling  . Allegra [Fexofenadine] Other (See Comments)    Facial and head swelling.  . Benadryl [Diphenhydramine Hcl] Hives  . Chantix [Varenicline]     Agitated, anxious  . Claritin [Loratadine] Swelling    Facial swelling  . Clindamycin/Lincomycin Hives  . Amoxicillin Rash  . Lamictal [Lamotrigine] Rash  . Silicone Itching and Rash    Social History:  Ambulatory   Independently  Lives at home with family     reports that she has been smoking Cigarettes.  She has a 29 pack-year smoking history. She has never used  smokeless tobacco. She reports that she does not drink alcohol or use illicit drugs.    Family History: family history includes Alcoholism in her sister; Dementia in her mother; Diabetes type II in her sister.    Physical Exam: Patient Vitals for the past 24 hrs:  BP Temp Temp src Pulse Resp SpO2 Height Weight  07/02/14 0501 - - - - - 98 % - -  07/02/14 0500 114/64 mmHg - - 87 16 97 % - -  07/02/14 0445 105/67 mmHg - - 86 17 97 % - -  07/02/14 0435 107/65 mmHg 97.7 F (36.5 C) Oral 86 20 97 % 4' 9.75" (1.467 m) 65.772 kg (145 lb)    1. General:  in No Acute distress 2. Psychological:somnolent but Oriented 3.  Head/ENT:   Moist  Mucous Membranes                          Head Non traumatic, neck supple                          Normal Dentition 4. SKIN: normal Skin turgor,  Skin clean Dry and intact no rash 5. Heart: Regular rate and rhythm no Murmur, Rub or gallop 6. Lungs:  no wheezes fine crackles   7. Abdomen: Soft, non-tender, Non distended 8. Lower extremities: no clubbing, cyanosis, or edema 9. Neurologically Grossly intact, moving all 4 extremities equally 10. MSK: Normal range of motion  body mass index is 30.56 kg/(m^2).   Labs on Admission:   Recent Labs  07/01/14 2359  NA 137  K 3.8  CL 98  CO2 24  GLUCOSE 167*  BUN 13  CREATININE 0.60  CALCIUM 10.0   No results found for this basename: AST, ALT, ALKPHOS, BILITOT, PROT, ALBUMIN,  in the last 72 hours No results found for this basename: LIPASE, AMYLASE,  in the last 72 hours  Recent Labs  07/01/14 2359  WBC 17.1*  NEUTROABS 13.1*  HGB 13.5  HCT 39.1  MCV 86.9  PLT 220   No results found for this basename: CKTOTAL, CKMB, CKMBINDEX, TROPONINI,  in the last 72 hours No results found for this basename: TSH, T4TOTAL, FREET3, T3FREE, THYROIDAB,  in the last 72 hours No results found for this basename: VITAMINB12, FOLATE, FERRITIN, TIBC, IRON, RETICCTPCT,  in the last 72 hours No results found for this  basename: HGBA1C    Estimated Creatinine Clearance: 70.8 ml/min (by C-G formula based on Cr of 0.6). ABG    Component Value Date/Time   TCO2 21 02/15/2012 0859     Lab Results  Component Value Date   DDIMER  Value: 0.33        AT THE INHOUSE ESTABLISHED CUTOFF VALUE OF 0.48 ug/mL FEU, THIS ASSAY HAS BEEN DOCUMENTED IN THE LITERATURE TO HAVE A SENSITIVITY AND NEGATIVE PREDICTIVE VALUE OF AT LEAST 98 TO 99%.  THE TEST RESULT SHOULD BE CORRELATED WITH AN ASSESSMENT OF THE CLINICAL PROBABILITY OF DVT / VTE. 09/04/2009     Other results:   BNP (last 3 results)  Recent Labs  10/05/13 2152  PROBNP 135.8*    Filed Weights   07/02/14 0435  Weight: 65.772 kg (145 lb)     Cultures:    Component Value Date/Time   SDES URINE, CLEAN CATCH 10/07/2013 1125   SPECREQUEST NONE 10/07/2013 1125   CULT  Value: NO GROWTH 5 DAYS Performed at [facility] 10/05/2013 2335   REPTSTATUS 10/08/2013 FINAL 10/07/2013 1125     Radiological Exams on Admission: Dg Chest 2 View  07/01/2014   CLINICAL DATA:  Cough, body aches, low-grade temperature. Previous pneumonia 2 weeks ago. Symptoms resolved and now have recurred.  EXAM: CHEST  2 VIEW  COMPARISON:  06/05/2014  FINDINGS: Normal heart size and pulmonary vascularity. Persistent perihilar interstitial changes consistent with bronchiolitis or reactive airways disease. Persistent vague nodular infiltrative changes in the left mid lung, similar prior study. Given persistence of these findings, suggest follow-up imaging after resolution of acute process to exclude developing pulmonary nodules. No blunting of costophrenic angles. No pneumothorax.  IMPRESSION: Persistent perihilar interstitial changes consistent with bronchiolitis or reactive airways disease. Persistent vague nodular infiltrative changes in the left mid lung,  similar to prior study. Findings likely represent residual or recurrent infection, but given persistence of findings, follow-up imaging  after resolution of acute process is recommended to exclude developing pulmonary nodules.   Electronically Signed   By: Lucienne Capers M.D.   On: 07/01/2014 22:40   Ct Angio Chest W/cm &/or Wo Cm  07/02/2014   CLINICAL DATA:  Diagnosed with pneumonia 2 weeks ago. Recurrence of symptoms with low-grade temperature and body aches. Cough.  EXAM: CT ANGIOGRAPHY CHEST WITH CONTRAST  TECHNIQUE: Multidetector CT imaging of the chest was performed using the standard protocol during bolus administration of intravenous contrast. Multiplanar CT image reconstructions and MIPs were obtained to evaluate the vascular anatomy.  CONTRAST:  123mL OMNIPAQUE IOHEXOL 350 MG/ML SOLN  COMPARISON:  06/28/2009  FINDINGS: Patchy mostly perihilar ground-glass infiltrates demonstrated in both upper lungs and right middle lung/ left lingula. Changes are likely to be due to multifocal pneumonia although other etiologies such as hemorrhage of edema, atypical infection, or infiltrating neoplasm could also have this appearance. Followup in 3 months after resolution of acute process is recommended to exclude underlying chronic process. Similar infiltrates were demonstrated on the previous study. Mild enlargement of bilateral hilar and mediastinal lymph nodes, likely reactive. No pleural effusion. No pneumothorax.  Normal heart size. Normal caliber thoracic aorta. No evidence of aneurysm. Central pulmonary arteries are well opacified without evidence of pulmonary embolus. Esophagus is mostly decompressed. Lower esophageal wall demonstrates mild thickening which may be due to reflux changes.  Visualized portions of the upper abdominal organs demonstrate prominent diffuse fatty infiltration of the liver. No destructive bone lesions are appreciated.  Review of the MIP images confirms the above findings.  IMPRESSION: Patchy ground-glass infiltrates in both lungs. Changes likely represent multifocal pneumonia although other etiologies are also  considered. Recommend followup in 3 months after resolution of acute process to exclude underlying chronic abnormality. Borderline lymphadenopathy in the mediastinum and hilar regions, likely reactive. Diffuse fatty infiltration of the liver.   Electronically Signed   By: Lucienne Capers M.D.   On: 07/02/2014 01:16    Chart has been reviewed  Assessment/Plan 43 yo F with hx of possible pulmonary fibrosis and asthma have been see by Dr. Melvyn Novas in the past here with reccurent PNA despite recent treatment with po antibitics Present on Admission:  . CAP (community acquired pneumonia) = recurrent in the setting of lung disease. Will cover broadly with vanc, cefepime and azythro. Pulmonology to see in AM to evaluate for chronic lung disease. Will obtain ANA . SMOKER - will write for a patch, spoke about importance of quitting . ASTHMA - Albuterol PRN   Prophylaxis:   Lovenox, Protonix  CODE STATUS:  FULL CODE    Other plan as per orders.  I have spent a total of 55 min on this admission  Rajeev Escue 07/02/2014, 5:43 AM  [facility]  Pager [phone]   If 7AM-7PM, please contact the day team taking care of the patient  [email or web address]  Password [email or web address]

## 2014-07-02 NOTE — ED Notes (Signed)
MD at bedside to discuss results of testing.  Placed on droplet precautions.

## 2014-07-02 NOTE — Progress Notes (Signed)
Patient notified RN of desire to sign out of the hospital AMA. Patient stated that "my dog is tearing up my house. I can't afford to have him tear everything up". On-call provider was notified, instructed RN to educate patient regarding risk of leaving the hospital before discharge. Will educate and continue to monitor.

## 2014-07-02 NOTE — ED Provider Notes (Signed)
Following admission patient decided to leave AMA because she needs to get her significant other home.  She has decision making capacity to refuse care. Risks are but are not limited to death, Sepsis, acute respiratory failure and prolonged morbidity she is welcome to return at any time.  Carlisle Beers, MD 07/02/14 [phone]

## 2014-07-02 NOTE — Progress Notes (Signed)
Patient still desires to leave AMA. RN discussed with patient that she is leaving against medical advice and that her treatment team feels it best that she remain in the hospital. Patient states that she understands the risks but still needs to go home. All medications returned to patient.

## 2014-07-03 LAB — LEGIONELLA ANTIGEN, URINE: LEGIONELLA ANTIGEN, URINE: NEGATIVE

## 2014-07-03 LAB — ANA: Anti Nuclear Antibody(ANA): NEGATIVE

## 2014-07-10 LAB — CULTURE, BLOOD (ROUTINE X 2)
CULTURE: NO GROWTH
Culture: NO GROWTH

## 2014-07-26 NOTE — Discharge Summary (Addendum)
Physician Discharge Summary  [name] OZH:[id number] DOB: Aug 03, 1971 DOA: 07/02/2014  PCP: Andria Frames, MD  Admit date: 07/02/2014 Discharge date: 07/26/2014  Time spent: >45 minutes  This patient left the hospital AMA at 10-11 PM (after I rounded on her) on 8/2.    Discharge Diagnoses:  Principal Problem:   CAP (community acquired pneumonia) Active Problems:   SMOKER   ASTHMA   PULMONARY FIBROSIS ILD POST INFLAMMATORY CHRONIC   Nicotine dependence   Cough   History of present illness:  Patient admitted early this AM. I have evaluated her. She has cough but is not significantly hypoxic. Recently was treated with a Z pak and states she improved completely but became sick again about 3-4 days ago, starting with a cough and then developed a fever on the day before admission. CT reveals pneumonia picture.    Hospital Course:  CAP  -Continue antibiotics- Pulm suggesting to treat as CAP no h/o or current suspicion for ILD - switched to Levaquin rather than Rocephin and Zithromax.   Dehydration  - given IVF- resolved  Smoker  - advised to quit   Bipolar disorder  - cont home meds   Asthma  - cont Combivent   Hypothyroid  - cont Levothyroxine   Procedures:  none  Consultations:  none  Discharge Exam: Filed Weights   07/02/14 0435 07/02/14 0639  Weight: 65.772 kg (145 lb) 69.9 kg (154 lb 1.6 oz)   Filed Vitals:   07/02/14 2133  BP: 122/82  Pulse: 85  Temp: 98.1 F (36.7 C)  Resp: 15    General: AAO x 3 Cardiovascular: RRR, no murmurs Respiratory: CTA b/l  GI: non-tender, bowel sounds positive, non-distended  Discharge Instructions You were cared for by a hospitalist during your hospital stay. If you have any questions about your discharge medications or the care you received while you were in the hospital after you are discharged, you can call the unit and asked to speak with the hospitalist on call if the hospitalist that took care of you is  not available. Once you are discharged, your primary care physician will handle any further medical issues. Please note that NO REFILLS for any discharge medications will be authorized once you are discharged, as it is imperative that you return to your primary care physician (or establish a relationship with a primary care physician if you do not have one) for your aftercare needs so that they can reassess your need for medications and monitor your lab values.     Medication List    ASK your doctor about these medications       ABILIFY 15 MG tablet  Generic drug:  ARIPiprazole  Take 15 mg by mouth every evening.     albuterol 108 (90 BASE) MCG/ACT inhaler  Commonly known as:  PROVENTIL HFA;VENTOLIN HFA  Inhale 2 puffs into the lungs every 4 (four) hours as needed for wheezing or shortness of breath.     albuterol-ipratropium 18-103 MCG/ACT inhaler  Commonly known as:  COMBIVENT  Inhale 1 puff into the lungs every 6 (six) hours as needed for wheezing or shortness of breath.     carbamazepine 200 MG Cp12 12 hr capsule  Commonly known as:  EQUETRO  Take 200 mg by mouth daily.     CENTRUM SPECIALIST ENERGY PO  Take 1 tablet by mouth daily.     desmopressin 0.2 MG tablet  Commonly known as:  DDAVP  Take 0.4 mg by mouth at bedtime.  desvenlafaxine 100 MG 24 hr tablet  Commonly known as:  PRISTIQ  Take 100 mg by mouth daily.     doxepin 50 MG capsule  Commonly known as:  SINEQUAN  Take 50-100 mg by mouth at bedtime as needed (for sleep).     esomeprazole 40 MG capsule  Commonly known as:  NEXIUM  Take 40 mg by mouth daily before breakfast.     fenofibrate micronized 134 MG capsule  Commonly known as:  LOFIBRA  Take 134 mg by mouth daily.     gabapentin 100 MG capsule  Commonly known as:  NEURONTIN  Take 100-200 mg by mouth See admin instructions. Take 1 capsule in the morning, 1 capsule at noon and 2 capsules at bedtime.     ibuprofen 200 MG tablet  Commonly known as:   ADVIL,MOTRIN  Take 400 mg by mouth every 6 (six) hours as needed for moderate pain.     levothyroxine 50 MCG tablet  Commonly known as:  SYNTHROID, LEVOTHROID  Take 50 mcg by mouth daily.     LORazepam 1 MG tablet  Commonly known as:  ATIVAN  Take 1 mg by mouth every 8 (eight) hours as needed for anxiety.     meloxicam 15 MG tablet  Commonly known as:  MOBIC  Take 15 mg by mouth daily.     ondansetron 4 MG disintegrating tablet  Commonly known as:  ZOFRAN-ODT  Take 4 mg by mouth every 6 (six) hours as needed for nausea or vomiting.     oxyCODONE-acetaminophen 10-325 MG per tablet  Commonly known as:  PERCOCET  Take 1 tablet by mouth 2 (two) times daily as needed for pain (May take 0.5 tablet in the moning).     oxymorphone 15 MG 12 hr tablet  Commonly known as:  OPANA ER  Take 15 mg by mouth every 12 (twelve) hours.     temazepam 30 MG capsule  Commonly known as:  RESTORIL  Take 30-60 mg by mouth at bedtime as needed for sleep.     tiZANidine 4 MG tablet  Commonly known as:  ZANAFLEX  Take 4 mg by mouth 3 (three) times daily as needed for muscle spasms.       Allergies  Allergen Reactions  . Zyrtec [Cetirizine Hcl] Shortness Of Breath    Throat swelling  . Allegra [Fexofenadine] Other (See Comments)    Facial and head swelling.  . Benadryl [Diphenhydramine Hcl] Hives  . Chantix [Varenicline]     Agitated, anxious  . Claritin [Loratadine] Swelling    Facial swelling  . Clindamycin/Lincomycin Hives  . Amoxicillin Rash  . Lamictal [Lamotrigine] Rash  . Silicone Itching and Rash      The results of significant diagnostics from this hospitalization (including imaging, microbiology, ancillary and laboratory) are listed below for reference.    Significant Diagnostic Studies: Dg Chest 2 View  07/01/2014   CLINICAL DATA:  Cough, body aches, low-grade temperature. Previous pneumonia 2 weeks ago. Symptoms resolved and now have recurred.  EXAM: CHEST  2 VIEW   COMPARISON:  06/05/2014  FINDINGS: Normal heart size and pulmonary vascularity. Persistent perihilar interstitial changes consistent with bronchiolitis or reactive airways disease. Persistent vague nodular infiltrative changes in the left mid lung, similar prior study. Given persistence of these findings, suggest follow-up imaging after resolution of acute process to exclude developing pulmonary nodules. No blunting of costophrenic angles. No pneumothorax.  IMPRESSION: Persistent perihilar interstitial changes consistent with bronchiolitis or reactive airways disease. Persistent vague  nodular infiltrative changes in the left mid lung, similar to prior study. Findings likely represent residual or recurrent infection, but given persistence of findings, follow-up imaging after resolution of acute process is recommended to exclude developing pulmonary nodules.   Electronically Signed   By: Lucienne Capers M.D.   On: 07/01/2014 22:40   Ct Angio Chest W/cm &/or Wo Cm  07/02/2014   CLINICAL DATA:  Diagnosed with pneumonia 2 weeks ago. Recurrence of symptoms with low-grade temperature and body aches. Cough.  EXAM: CT ANGIOGRAPHY CHEST WITH CONTRAST  TECHNIQUE: Multidetector CT imaging of the chest was performed using the standard protocol during bolus administration of intravenous contrast. Multiplanar CT image reconstructions and MIPs were obtained to evaluate the vascular anatomy.  CONTRAST:  190mL OMNIPAQUE IOHEXOL 350 MG/ML SOLN  COMPARISON:  06/28/2009  FINDINGS: Patchy mostly perihilar ground-glass infiltrates demonstrated in both upper lungs and right middle lung/ left lingula. Changes are likely to be due to multifocal pneumonia although other etiologies such as hemorrhage of edema, atypical infection, or infiltrating neoplasm could also have this appearance. Followup in 3 months after resolution of acute process is recommended to exclude underlying chronic process. Similar infiltrates were demonstrated on the  previous study. Mild enlargement of bilateral hilar and mediastinal lymph nodes, likely reactive. No pleural effusion. No pneumothorax.  Normal heart size. Normal caliber thoracic aorta. No evidence of aneurysm. Central pulmonary arteries are well opacified without evidence of pulmonary embolus. Esophagus is mostly decompressed. Lower esophageal wall demonstrates mild thickening which may be due to reflux changes.  Visualized portions of the upper abdominal organs demonstrate prominent diffuse fatty infiltration of the liver. No destructive bone lesions are appreciated.  Review of the MIP images confirms the above findings.  IMPRESSION: Patchy ground-glass infiltrates in both lungs. Changes likely represent multifocal pneumonia although other etiologies are also considered. Recommend followup in 3 months after resolution of acute process to exclude underlying chronic abnormality. Borderline lymphadenopathy in the mediastinum and hilar regions, likely reactive. Diffuse fatty infiltration of the liver.   Electronically Signed   By: Lucienne Capers M.D.   On: 07/02/2014 01:16    Microbiology: No results found for this or any previous visit (from the past 240 hour(s)).   Labs: Basic Metabolic Panel: No results found for this basename: NA, K, CL, CO2, GLUCOSE, BUN, CREATININE, CALCIUM, MG, PHOS,  in the last 168 hours Liver Function Tests: No results found for this basename: AST, ALT, ALKPHOS, BILITOT, PROT, ALBUMIN,  in the last 168 hours No results found for this basename: LIPASE, AMYLASE,  in the last 168 hours No results found for this basename: AMMONIA,  in the last 168 hours CBC: No results found for this basename: WBC, NEUTROABS, HGB, HCT, MCV, PLT,  in the last 168 hours Cardiac Enzymes: No results found for this basename: CKTOTAL, CKMB, CKMBINDEX, TROPONINI,  in the last 168 hours BNP: BNP (last 3 results)  Recent Labs  10/05/13 2152  PROBNP 135.8*   CBG: No results found for this  basename: GLUCAP,  in the last 168 hours     Signed:  Debbe Odea, MD [facility] 07/02/2014, 4:42 PM

## 2014-11-15 ENCOUNTER — Other Ambulatory Visit: Payer: Self-pay

## 2014-11-15 ENCOUNTER — Other Ambulatory Visit: Payer: Self-pay | Admitting: Obstetrics and Gynecology

## 2014-11-15 DIAGNOSIS — Z1231 Encounter for screening mammogram for malignant neoplasm of breast: Secondary | ICD-10-CM

## 2014-11-20 ENCOUNTER — Ambulatory Visit: Payer: Medicare Other

## 2014-11-28 ENCOUNTER — Ambulatory Visit: Payer: Medicare Other

## 2014-12-13 ENCOUNTER — Encounter (HOSPITAL_COMMUNITY): Payer: Self-pay | Admitting: General Practice

## 2014-12-14 ENCOUNTER — Other Ambulatory Visit (HOSPITAL_COMMUNITY): Payer: Self-pay | Admitting: Obstetrics and Gynecology

## 2014-12-14 NOTE — H&P (Signed)
[name] is a 44 y.o.  female P 0-0-1-0 presents for hysteroscopy, dilatation, curettage, resection of endometrial masses and endometrial ablation because of irregular vaginal bleeding. Typically the patient would have monthly periods that lasted for 5 days with super plus tampon change 5 times a day but no cramping.  In November 2015, however, in addition to her usual flow,  she experienced 13 days of intermittent spotting with a few days that she wore a tampon twice a day.  Still she was without cramping.  She goes on to deny any vaginitis symptoms or changes in bowel or bladder function.   An endometrial biopsy in December 2015 returned proliferative endometrium without malignancy or hyperplasia.  A sono-hysterogram at the same time showed: uterus-4.35 x 5.25 x 3.51 cm, endometrium-6.29 mm with #2 masses measuring: 0.58 x 0.33 x 0.63 cm and 0.86 x 0.33 x 0.74 cm.  Both contained color Doppler flow.  There was also observed #3 intramural fibroids: right fundal-2.32 x 2.63 x 2.38 cm, posterior midline-1.46 x 1.04 x 1.37 cm and left anterior-2.69 x 1.70 x 2.59 cm; both ovaries appeared normal on this study and uterine length from fundus to external os-6.7 cm;  uterine cavity width-3 cm and length-3.2 cm.  A review of both surgical and medical management options were given to the patient however, she has decided to proceed with removal on the endometrial masses and endometrial ablation for management.  Past Medical History  OB History: G:1;  P: 0-0-1-0  GYN History: menarche: 44 YO    LMP: 11/25/2014    Contracepton abstinence  The patient reports a past history of: HPV.  Has a  history of abnormal PAP smear with negative colposcopies.  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) Denies problems with anesthesia or history of blood transfusions  Family History: Dementia, Alcoholism, Diabetes Mellitus and Heart Disease  Social History: Single (In Same Sex Relationship) and is Disabled;  Former 1 PPD smoker (quit 12/12/2014) and no alcohol intake   Medications: Temazepam 30 mg 1-2 qhs prn-sleep Tizanidine 4 mg ProAir 90 mcg/inhalation as directed Abilify 15 mg qhs Advair Diskus 250/50 mcg as directed Aripiprazole 20 mg as directed Combivent Respimat 20/100 mcg as directed Desmopressin 0.2 mg 2 po qhs Equetro ER 200 mg as directed Gabapentin 300 mg as directed Epi Pen prn Levabuterol 1.25 mg/81mL Solution every 6 hours prn-wheezing/shortness of breath Lorazepam 1 mg 2-3 times a day prn Meloxicam 15 mg daily Nexium 40 mg daily as directed Opana ER 15 mg daily Percocet 10/325 as directed Prednisone 10 mg daily Pristiq 100 mg daily   Allergies  Allergen Reactions  . Zyrtec [Cetirizine Hcl] Shortness Of Breath    Throat swelling  . Allegra [Fexofenadine] Other (See Comments)    Facial and head swelling.  . Benadryl [Diphenhydramine Hcl] Hives  . Chantix [Varenicline]     Agitated, anxious  . Claritin [Loratadine] Swelling    Facial swelling  . Clindamycin/Lincomycin Hives  . Amoxicillin Rash  . Lamictal [Lamotrigine] Rash  . Silicone Itching and Rash   Denies sensitivity to peanuts, shellfish, soy, latex or adhesives.  ROS: Admits to  glasses, upper and lower partial dental plates, rare incontinence, rare urinary urgency but denies headache, vision changes, nasal congestion, dysphagia, tinnitus, dizziness, hoarseness, cough,  chest pain, shortness of breath, nausea, vomiting, diarrhea,constipation,  urinary frequency,  dysuria, hematuria, vaginitis symptoms, pelvic  pain, swelling of joints,easy bruising,  myalgias, arthralgias, skin rashes, unexplained weight loss and except as is mentioned in the history of present illness, patient's review of systems is otherwise negative.   Physical Exam  Bp: 102/64  P: 88  R: 20  Temperature: 98.4 degrees F orally    Weight: 139 lbs.  Height: 4\' 9"    BMI:20.1  Neck: supple without masses or thyromegaly Lungs: clear to auscultation Heart: regular rate and rhythm Abdomen: soft, non-tender and no organomegaly Pelvic:EGBUS- wnl; vagina-normal rugae; uterus-normal size though exam limited by habitus, cervix without lesions or motion tenderness; adnexae-no tenderness or masses Extremities:  no clubbing, cyanosis or edema   Assesment: Irregular Vaginal Bleeding           Endometrial Masses   Disposition: Reviewed the risks of surgery to include, but not limited to: reaction to anesthesia, damage to adjacent organs, infection and excessive bleeding. The patient verbalized understanding of these risks and has consented to proceed with Hysteroscopy, Dilatation, Curettage with Myosure and Hydro-Thermal Ablation  at [facility] on 12/26/14 at 1:30 p.m.  CSN# [id number]   [name] J. Florene Glen, PA-C  for Dr. Franklyn Lor. Dillard

## 2014-12-15 ENCOUNTER — Other Ambulatory Visit: Payer: Self-pay | Admitting: Obstetrics and Gynecology

## 2014-12-26 ENCOUNTER — Encounter (HOSPITAL_COMMUNITY): Payer: Self-pay | Admitting: Certified Registered Nurse Anesthetist

## 2014-12-26 ENCOUNTER — Ambulatory Visit (HOSPITAL_COMMUNITY): Payer: Medicare Other | Admitting: Anesthesiology

## 2014-12-26 ENCOUNTER — Encounter (HOSPITAL_COMMUNITY)
Admission: RE | Disposition: A | Discharge status: Home / Self Care | Payer: Self-pay | Source: Ambulatory Visit | Attending: Obstetrics and Gynecology

## 2014-12-26 ENCOUNTER — Ambulatory Visit (HOSPITAL_COMMUNITY)
Admission: RE | Admit: 2014-12-26 | Discharge: 2014-12-26 | Disposition: A | Discharge status: Home / Self Care | Payer: Medicare Other | Source: Ambulatory Visit | Attending: Obstetrics and Gynecology | Admitting: Obstetrics and Gynecology

## 2014-12-26 DIAGNOSIS — Z79899 Other long term (current) drug therapy: Secondary | ICD-10-CM | POA: Diagnosis not present

## 2014-12-26 DIAGNOSIS — N926 Irregular menstruation, unspecified: Secondary | ICD-10-CM | POA: Insufficient documentation

## 2014-12-26 DIAGNOSIS — F418 Other specified anxiety disorders: Secondary | ICD-10-CM | POA: Diagnosis not present

## 2014-12-26 DIAGNOSIS — K219 Gastro-esophageal reflux disease without esophagitis: Secondary | ICD-10-CM | POA: Insufficient documentation

## 2014-12-26 DIAGNOSIS — J45909 Unspecified asthma, uncomplicated: Secondary | ICD-10-CM | POA: Insufficient documentation

## 2014-12-26 DIAGNOSIS — N840 Polyp of corpus uteri: Secondary | ICD-10-CM | POA: Insufficient documentation

## 2014-12-26 DIAGNOSIS — D260 Other benign neoplasm of cervix uteri: Secondary | ICD-10-CM | POA: Insufficient documentation

## 2014-12-26 DIAGNOSIS — F319 Bipolar disorder, unspecified: Secondary | ICD-10-CM | POA: Insufficient documentation

## 2014-12-26 DIAGNOSIS — M1991 Primary osteoarthritis, unspecified site: Secondary | ICD-10-CM | POA: Diagnosis not present

## 2014-12-26 DIAGNOSIS — Z720 Tobacco use: Secondary | ICD-10-CM | POA: Diagnosis not present

## 2014-12-26 HISTORY — DX: Depression, unspecified: F32.A

## 2014-12-26 HISTORY — PX: DILITATION & CURRETTAGE/HYSTROSCOPY WITH HYDROTHERMAL ABLATION: SHX5570

## 2014-12-26 HISTORY — PX: DILATATION & CURETTAGE/HYSTEROSCOPY WITH MYOSURE: SHX6511

## 2014-12-26 HISTORY — DX: Major depressive disorder, single episode, unspecified: F32.9

## 2014-12-26 HISTORY — DX: Unspecified convulsions: R56.9

## 2014-12-26 HISTORY — DX: Anxiety disorder, unspecified: F41.9

## 2014-12-26 LAB — CBC
HCT: 41.7 % (ref 36.0–46.0)
Hemoglobin: 14.4 g/dL (ref 12.0–15.0)
MCH: 31 pg (ref 26.0–34.0)
MCHC: 34.5 g/dL (ref 30.0–36.0)
MCV: 89.7 fL (ref 78.0–100.0)
Platelets: 241 10*3/uL (ref 150–400)
RBC: 4.65 MIL/uL (ref 3.87–5.11)
RDW: 14 % (ref 11.5–15.5)
WBC: 10.4 10*3/uL (ref 4.0–10.5)

## 2014-12-26 LAB — PREGNANCY, URINE: PREG TEST UR: NEGATIVE

## 2014-12-26 SURGERY — DILATATION & CURETTAGE/HYSTEROSCOPY WITH MYOSURE
Anesthesia: General

## 2014-12-26 MED ORDER — FENTANYL CITRATE 0.05 MG/ML IJ SOLN
INTRAMUSCULAR | Status: AC
  Filled 2014-12-26: qty 2

## 2014-12-26 MED ORDER — ONDANSETRON HCL 4 MG/2ML IJ SOLN: 4.0000 mg | Freq: Once | INTRAMUSCULAR | Status: DC | PRN

## 2014-12-26 MED ORDER — LIDOCAINE HCL (CARDIAC) 20 MG/ML IV SOLN
INTRAVENOUS | Status: DC | PRN
  Administered 2014-12-26: 70 mg via INTRAVENOUS

## 2014-12-26 MED ORDER — LIDOCAINE HCL (CARDIAC) 20 MG/ML IV SOLN
INTRAVENOUS | Status: AC
  Filled 2014-12-26: qty 5

## 2014-12-26 MED ORDER — MIDAZOLAM HCL 2 MG/2ML IJ SOLN
INTRAMUSCULAR | Status: DC | PRN
  Administered 2014-12-26: 2 mg via INTRAVENOUS

## 2014-12-26 MED ORDER — KETOROLAC TROMETHAMINE 30 MG/ML IJ SOLN
INTRAMUSCULAR | Status: AC
  Filled 2014-12-26: qty 1

## 2014-12-26 MED ORDER — FENTANYL CITRATE 0.05 MG/ML IJ SOLN
INTRAMUSCULAR | Status: DC | PRN
  Administered 2014-12-26 (×3): 25 ug via INTRAVENOUS
  Administered 2014-12-26: 50 ug via INTRAVENOUS
  Administered 2014-12-26: 25 ug via INTRAVENOUS
  Administered 2014-12-26: 50 ug via INTRAVENOUS

## 2014-12-26 MED ORDER — SODIUM CHLORIDE 0.9 % IR SOLN
Status: DC | PRN
  Administered 2014-12-26: 6000 mL

## 2014-12-26 MED ORDER — MIDAZOLAM HCL 2 MG/2ML IJ SOLN
INTRAMUSCULAR | Status: AC
  Filled 2014-12-26: qty 2

## 2014-12-26 MED ORDER — FENTANYL CITRATE 0.05 MG/ML IJ SOLN: 25.0000 ug | INTRAMUSCULAR | Status: DC | PRN

## 2014-12-26 MED ORDER — BUPIVACAINE HCL (PF) 0.25 % IJ SOLN
INTRAMUSCULAR | Status: AC
  Filled 2014-12-26: qty 30

## 2014-12-26 MED ORDER — PROPOFOL 10 MG/ML IV BOLUS
INTRAVENOUS | Status: AC
  Filled 2014-12-26: qty 20

## 2014-12-26 MED ORDER — ONDANSETRON HCL 4 MG/2ML IJ SOLN
INTRAMUSCULAR | Status: DC | PRN
  Administered 2014-12-26: 4 mg via INTRAVENOUS

## 2014-12-26 MED ORDER — DEXAMETHASONE SODIUM PHOSPHATE 10 MG/ML IJ SOLN
INTRAMUSCULAR | Status: DC | PRN
  Administered 2014-12-26: 4 mg via INTRAVENOUS

## 2014-12-26 MED ORDER — PHENYLEPHRINE HCL 10 MG/ML IJ SOLN
INTRAMUSCULAR | Status: DC | PRN
  Administered 2014-12-26 (×4): 40 ug via INTRAVENOUS

## 2014-12-26 MED ORDER — PROPOFOL 10 MG/ML IV BOLUS
INTRAVENOUS | Status: DC | PRN
  Administered 2014-12-26: 160 mg via INTRAVENOUS

## 2014-12-26 MED ORDER — SCOPOLAMINE 1 MG/3DAYS TD PT72
1.0000 | MEDICATED_PATCH | Freq: Once | TRANSDERMAL | Status: DC
  Administered 2014-12-26: 1.5 mg via TRANSDERMAL

## 2014-12-26 MED ORDER — PHENYLEPHRINE 40 MCG/ML (10ML) SYRINGE FOR IV PUSH (FOR BLOOD PRESSURE SUPPORT)
PREFILLED_SYRINGE | INTRAVENOUS | Status: AC
  Filled 2014-12-26: qty 10

## 2014-12-26 MED ORDER — DOXYCYCLINE HYCLATE 50 MG PO CAPS: ORAL_CAPSULE | ORAL | Status: DC

## 2014-12-26 MED ORDER — ONDANSETRON HCL 4 MG/2ML IJ SOLN
INTRAMUSCULAR | Status: AC
  Filled 2014-12-26: qty 2

## 2014-12-26 MED ORDER — DEXAMETHASONE SODIUM PHOSPHATE 4 MG/ML IJ SOLN
INTRAMUSCULAR | Status: AC
  Filled 2014-12-26: qty 1

## 2014-12-26 MED ORDER — FAMOTIDINE IN NACL 20-0.9 MG/50ML-% IV SOLN
20.0000 mg | Freq: Once | INTRAVENOUS | Status: AC
  Administered 2014-12-26: 20 mg via INTRAVENOUS
  Filled 2014-12-26: qty 50

## 2014-12-26 MED ORDER — HYDROCODONE-ACETAMINOPHEN 5-325 MG PO TABS: 1.0000 | ORAL_TABLET | Freq: Four times a day (QID) | ORAL | Status: DC | PRN

## 2014-12-26 MED ORDER — KETOROLAC TROMETHAMINE 30 MG/ML IJ SOLN
INTRAMUSCULAR | Status: DC | PRN
  Administered 2014-12-26: 30 mg via INTRAVENOUS

## 2014-12-26 MED ORDER — ACETAMINOPHEN 10 MG/ML IV SOLN
1000.0000 mg | Freq: Once | INTRAVENOUS | Status: AC
Start: 2014-12-26 — End: 2014-12-26
  Administered 2014-12-26: 1000 mg via INTRAVENOUS
  Filled 2014-12-26: qty 100

## 2014-12-26 MED ORDER — SILVER NITRATE-POT NITRATE 75-25 % EX MISC
CUTANEOUS | Status: AC
  Filled 2014-12-26: qty 1

## 2014-12-26 MED ORDER — BUPIVACAINE-EPINEPHRINE 0.25% -1:200000 IJ SOLN
INTRAMUSCULAR | Status: DC | PRN
  Administered 2014-12-26: 20 mL

## 2014-12-26 MED ORDER — SCOPOLAMINE 1 MG/3DAYS TD PT72
MEDICATED_PATCH | TRANSDERMAL | Status: AC
  Administered 2014-12-26: 1.5 mg via TRANSDERMAL
  Filled 2014-12-26: qty 1

## 2014-12-26 MED ORDER — LACTATED RINGERS IV SOLN
INTRAVENOUS | Status: DC
  Administered 2014-12-26 (×2): via INTRAVENOUS

## 2014-12-26 SURGICAL SUPPLY — 21 items
CANISTER SUCT 3000ML (MISCELLANEOUS) ×2 IMPLANT
CATH ROBINSON RED A/P 16FR (CATHETERS) ×2 IMPLANT
CLOTH BEACON ORANGE TIMEOUT ST (SAFETY) ×2 IMPLANT
CONTAINER PREFILL 10% NBF 60ML (FORM) ×4 IMPLANT
DEVICE MYOSURE CLASSIC (MISCELLANEOUS) ×2 IMPLANT
DEVICE MYOSURE LITE (MISCELLANEOUS) IMPLANT
ELECT REM PT RETURN 9FT ADLT (ELECTROSURGICAL) ×2
ELECTRODE REM PT RTRN 9FT ADLT (ELECTROSURGICAL) ×1 IMPLANT
FILTER ARTHROSCOPY CONVERTOR (FILTER) ×2 IMPLANT
GLOVE BIO SURGEON STRL SZ 6.5 (GLOVE) ×4 IMPLANT
GLOVE BIOGEL PI IND STRL 7.0 (GLOVE) ×2 IMPLANT
GLOVE BIOGEL PI INDICATOR 7.0 (GLOVE) ×2
GOWN STRL REUS W/TWL LRG LVL3 (GOWN DISPOSABLE) ×4 IMPLANT
PACK VAGINAL MINOR WOMEN LF (CUSTOM PROCEDURE TRAY) ×2 IMPLANT
PAD OB MATERNITY 4.3X12.25 (PERSONAL CARE ITEMS) ×2 IMPLANT
SEAL ROD LENS SCOPE MYOSURE (ABLATOR) ×2 IMPLANT
SET GENESYS HTA PROCERVA (MISCELLANEOUS) ×2 IMPLANT
TOWEL OR 17X24 6PK STRL BLUE (TOWEL DISPOSABLE) ×4 IMPLANT
TUBING AQUILEX INFLOW (TUBING) ×2 IMPLANT
TUBING AQUILEX OUTFLOW (TUBING) ×2 IMPLANT
WATER STERILE IRR 1000ML POUR (IV SOLUTION) ×2 IMPLANT

## 2014-12-26 NOTE — Interval H&P Note (Signed)
History and Physical Interval Note:  12/26/2014 1:02 PM  [name]  has presented today for surgery, with the diagnosis of Irregular Menstruation  The various methods of treatment have been discussed with the patient and family. After consideration of risks, benefits and other options for treatment, the patient has consented to  Procedure(s): DILATATION & CURETTAGE/HYSTEROSCOPY WITH MYOSURE (N/A) DILATATION & CURETTAGE/HYSTEROSCOPY WITH HYDROTHERMAL ABLATION (N/A) as a surgical intervention .  The patient's history has been reviewed, patient examined, no change in status, stable for surgery.  I have reviewed the patient's chart and labs.  Questions were answered to the patient's satisfaction.     [facility]

## 2014-12-26 NOTE — Discharge Instructions (Addendum)
DISCHARGE INSTRUCTIONS: D&C / D&E The following instructions have been prepared to help you care for yourself upon your return home.   Personal hygiene:  Use sanitary pads for vaginal drainage, not tampons.  Shower the day after your procedure.  NO tub baths, pools or Jacuzzis for 2-3 weeks.  Wipe front to back after using the bathroom.  Activity and limitations:  Do NOT drive or operate any equipment for 24 hours. The effects of anesthesia are still present and drowsiness may result.  Do NOT rest in bed all day.  Walking is encouraged.  Walk up and down stairs slowly.  You may resume your normal activity in one to two days or as indicated by your physician.  Sexual activity: NO intercourse for at least 2 weeks after the procedure, or as indicated by your physician.  Diet: Eat a light meal as desired this evening. You may resume your usual diet tomorrow.  Return to work: You may resume your work activities in one to two days or as indicated by your doctor.  What to expect after your surgery: Expect to have vaginal bleeding/discharge for 2-3 days and spotting for up to 10 days. It is not unusual to have soreness for up to 1-2 weeks. You may have a slight burning sensation when you urinate for the first day. Mild cramps may continue for a couple of days. You may have a regular period in 2-6 weeks.  Call your doctor for any of the following:  Excessive vaginal bleeding, saturating and changing one pad every hour.  Inability to urinate 6 hours after discharge from hospital.  Pain not relieved by pain medication.  Fever of 100.4 F or greater.  Unusual vaginal discharge or odor.   Call for an appointment:    Patients signature: ______________________  Nurses signature ________________________  Support person's signature_______________________   DO NOT TAKE ANY IBUPROFEN PRODUCTS UNTIL 8:30 TONIGHT.  YOU RECEIVED A SIMILAR MEDICATION IN THE OPERATING ROOM.   Do  not take duplicate medication doses Hysteroscopy Hysteroscopy is a procedure used for looking inside the womb (uterus). It may be done for various reasons, including:  To evaluate abnormal bleeding, fibroid (benign, noncancerous) tumors, polyps, scar tissue (adhesions), and possibly cancer of the uterus.  To look for lumps (tumors) and other uterine growths.  To look for causes of why a woman cannot get pregnant (infertility), causes of recurrent loss of pregnancy (miscarriages), or a lost intrauterine device (IUD).  To perform a sterilization by blocking the fallopian tubes from inside the uterus. In this procedure, a thin, flexible tube with a tiny light and camera on the end of it (hysteroscope) is used to look inside the uterus. A hysteroscopy should be done right after a menstrual period to be sure you are not pregnant. LET [facility] CARE PROVIDER KNOW ABOUT:   Any allergies you have.  All medicines you are taking, including vitamins, herbs, eye drops, creams, and over-the-counter medicines.  Previous problems you or members of your family have had with the use of anesthetics.  Any blood disorders you have.  Previous surgeries you have had.  Medical conditions you have. RISKS AND COMPLICATIONS  Generally, this is a safe procedure. However, as with any procedure, complications can occur. Possible complications include:  Putting a hole in the uterus.  Excessive bleeding.  Infection.  Damage to the cervix.  Injury to other organs.  Allergic reaction to medicines.  Too much fluid used in the uterus for the procedure. BEFORE THE  PROCEDURE   Ask your health care provider about changing or stopping any regular medicines.  Do not take aspirin or blood thinners for 1 week before the procedure, or as directed by your health care provider. These can cause bleeding.  If you smoke, do not smoke for 2 weeks before the procedure.  In some cases, a medicine is placed in the  cervix the day before the procedure. This medicine makes the cervix have a larger opening (dilate). This makes it easier for the instrument to be inserted into the uterus during the procedure.  Do not eat or drink anything for at least 8 hours before the surgery.  Arrange for someone to take you home after the procedure. PROCEDURE   You may be given a medicine to relax you (sedative). You may also be given one of the following:  A medicine that numbs the area around the cervix (local anesthetic).  A medicine that makes you sleep through the procedure (general anesthetic).  The hysteroscope is inserted through the vagina into the uterus. The camera on the hysteroscope sends a picture to a TV screen. This gives the surgeon a good view inside the uterus.  During the procedure, air or a liquid is put into the uterus, which allows the surgeon to see better.  Sometimes, tissue is gently scraped from inside the uterus. These tissue samples are sent to a lab for testing. AFTER THE PROCEDURE   If you had a general anesthetic, you may be groggy for a couple hours after the procedure.  If you had a local anesthetic, you will be able to go home as soon as you are stable and feel ready.  You may have some cramping. This normally lasts for a couple days.  You may have bleeding, which varies from light spotting for a few days to menstrual-like bleeding for 3-7 days. This is normal.  If your test results are not back during the visit, make an appointment with your health care provider to find out the results. Document Released: 02/23/2001 Document Revised: 09/07/2013 Document Reviewed: 06/16/2013 [facility] Patient Information 2015 [place], [place]. This information is not intended to replace advice given to you by your health care provider. Make sure you discuss any questions you have with your health care provider.

## 2014-12-26 NOTE — Anesthesia Postprocedure Evaluation (Signed)
  Anesthesia Post-op Note  Patient: [name]  Procedure(s) Performed: Procedure(s) (LRB): DILATATION & CURETTAGE/HYSTEROSCOPY WITH MYOSURE (N/A) DILATATION & CURETTAGE/HYSTEROSCOPY WITH HYDROTHERMAL ABLATION (N/A)  Patient Location: PACU  Anesthesia Type: General  Level of Consciousness: awake and alert   Airway and Oxygen Therapy: Patient Spontanous Breathing  Post-op Pain: mild  Post-op Assessment: Post-op Vital signs reviewed, Patient's Cardiovascular Status Stable, Respiratory Function Stable, Patent Airway and No signs of Nausea or vomiting  Last Vitals:  Filed Vitals:   12/26/14 1530  BP:   Pulse: 78  Temp: 36.8 C  Resp: 17    Post-op Vital Signs: stable   Complications: No apparent anesthesia complications

## 2014-12-26 NOTE — Anesthesia Preprocedure Evaluation (Signed)
Anesthesia Evaluation  Patient identified by MRN, date of birth, ID band Patient awake    Reviewed: Allergy & Precautions, NPO status , Patient's Chart, lab work & pertinent test results  History of Anesthesia Complications Negative for: history of anesthetic complications  Airway Mallampati: II  TM Distance: >3 FB Neck ROM: Full    Dental no notable dental hx. (+) Edentulous Upper, Poor Dentition, Edentulous Lower   Pulmonary asthma , Current Smoker,  breath sounds clear to auscultation  Pulmonary exam normal       Cardiovascular negative cardio ROS  Rhythm:Regular Rate:Normal     Neuro/Psych Seizures - (hx of seizures related to medications, none since and not taking any ppx),  PSYCHIATRIC DISORDERS Anxiety Depression Bipolar Disorder    GI/Hepatic Neg liver ROS, GERD-  Medicated and Controlled,  Endo/Other  negative endocrine ROSHypothyroidism   Renal/GU negative Renal ROS  negative genitourinary   Musculoskeletal  (+) Arthritis -, Osteoarthritis,  DDD, SCS in place, chronic pain   Abdominal   Peds negative pediatric ROS (+)  Hematology negative hematology ROS (+)   Anesthesia Other Findings   Reproductive/Obstetrics negative OB ROS                             Anesthesia Physical Anesthesia Plan  ASA: III  Anesthesia Plan: General   Post-op Pain Management:    Induction: Intravenous  Airway Management Planned: LMA  Additional Equipment:   Intra-op Plan:   Post-operative Plan: Extubation in OR  Informed Consent: I have reviewed the patients History and Physical, chart, labs and discussed the procedure including the risks, benefits and alternatives for the proposed anesthesia with the patient or authorized representative who has indicated his/her understanding and acceptance.   Dental advisory given  Plan Discussed with: CRNA  Anesthesia Plan Comments:          Anesthesia Quick Evaluation

## 2014-12-26 NOTE — Transfer of Care (Signed)
Immediate Anesthesia Transfer of Care Note  Patient: [name]  Procedure(s) Performed: Procedure(s): DILATATION & CURETTAGE/HYSTEROSCOPY WITH MYOSURE (N/A) DILATATION & CURETTAGE/HYSTEROSCOPY WITH HYDROTHERMAL ABLATION (N/A)  Patient Location: PACU  Anesthesia Type:General  Level of Consciousness: awake, alert , oriented and patient cooperative  Airway & Oxygen Therapy: Patient Spontanous Breathing and Patient connected to nasal cannula oxygen  Post-op Assessment: Report given to PACU RN and Post -op Vital signs reviewed and stable  Post vital signs: Reviewed and stable  Complications: No apparent anesthesia complications

## 2014-12-26 NOTE — H&P (View-Only) (Signed)
[name] is a 44 y.o.  female P 0-0-1-0 presents for hysteroscopy, dilatation, curettage, resection of endometrial masses and endometrial ablation because of irregular vaginal bleeding. Typically the patient would have monthly periods that lasted for 5 days with super plus tampon change 5 times a day but no cramping.  In November 2015, however, in addition to her usual flow,  she experienced 13 days of intermittent spotting with a few days that she wore a tampon twice a day.  Still she was without cramping.  She goes on to deny any vaginitis symptoms or changes in bowel or bladder function.   An endometrial biopsy in December 2015 returned proliferative endometrium without malignancy or hyperplasia.  A sono-hysterogram at the same time showed: uterus-4.35 x 5.25 x 3.51 cm, endometrium-6.29 mm with #2 masses measuring: 0.58 x 0.33 x 0.63 cm and 0.86 x 0.33 x 0.74 cm.  Both contained color Doppler flow.  There was also observed #3 intramural fibroids: right fundal-2.32 x 2.63 x 2.38 cm, posterior midline-1.46 x 1.04 x 1.37 cm and left anterior-2.69 x 1.70 x 2.59 cm; both ovaries appeared normal on this study and uterine length from fundus to external os-6.7 cm;  uterine cavity width-3 cm and length-3.2 cm.  A review of both surgical and medical management options were given to the patient however, she has decided to proceed with removal on the endometrial masses and endometrial ablation for management.  Past Medical History  OB History: G:1;  P: 0-0-1-0  GYN History: menarche: 44 YO    LMP: 11/25/2014    Contracepton abstinence  The patient reports a past history of: HPV.  Has a  history of abnormal PAP smear with negative colposcopies.  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) Denies problems with anesthesia or history of blood transfusions  Family History: Dementia, Alcoholism, Diabetes Mellitus and Heart Disease  Social History: Single (In Same Sex Relationship) and is Disabled;  Former 1 PPD smoker (quit 12/12/2014) and no alcohol intake   Medications: Temazepam 30 mg 1-2 qhs prn-sleep Tizanidine 4 mg ProAir 90 mcg/inhalation as directed Abilify 15 mg qhs Advair Diskus 250/50 mcg as directed Aripiprazole 20 mg as directed Combivent Respimat 20/100 mcg as directed Desmopressin 0.2 mg 2 po qhs Equetro ER 200 mg as directed Gabapentin 300 mg as directed Epi Pen prn Levabuterol 1.25 mg/54mL Solution every 6 hours prn-wheezing/shortness of breath Lorazepam 1 mg 2-3 times a day prn Meloxicam 15 mg daily Nexium 40 mg daily as directed Opana ER 15 mg daily Percocet 10/325 as directed Prednisone 10 mg daily Pristiq 100 mg daily   Allergies  Allergen Reactions  . Zyrtec [Cetirizine Hcl] Shortness Of Breath    Throat swelling  . Allegra [Fexofenadine] Other (See Comments)    Facial and head swelling.  . Benadryl [Diphenhydramine Hcl] Hives  . Chantix [Varenicline]     Agitated, anxious  . Claritin [Loratadine] Swelling    Facial swelling  . Clindamycin/Lincomycin Hives  . Amoxicillin Rash  . Lamictal [Lamotrigine] Rash  . Silicone Itching and Rash   Denies sensitivity to peanuts, shellfish, soy, latex or adhesives.  ROS: Admits to  glasses, upper and lower partial dental plates, rare incontinence, rare urinary urgency but denies headache, vision changes, nasal congestion, dysphagia, tinnitus, dizziness, hoarseness, cough,  chest pain, shortness of breath, nausea, vomiting, diarrhea,constipation,  urinary frequency,  dysuria, hematuria, vaginitis symptoms, pelvic  pain, swelling of joints,easy bruising,  myalgias, arthralgias, skin rashes, unexplained weight loss and except as is mentioned in the history of present illness, patient's review of systems is otherwise negative.   Physical Exam  Bp: 102/64  P: 88  R: 20  Temperature: 98.4 degrees F orally    Weight: 139 lbs.  Height: 4\' 9"    BMI:20.1  Neck: supple without masses or thyromegaly Lungs: clear to auscultation Heart: regular rate and rhythm Abdomen: soft, non-tender and no organomegaly Pelvic:EGBUS- wnl; vagina-normal rugae; uterus-normal size though exam limited by habitus, cervix without lesions or motion tenderness; adnexae-no tenderness or masses Extremities:  no clubbing, cyanosis or edema   Assesment: Irregular Vaginal Bleeding           Endometrial Masses   Disposition: Reviewed the risks of surgery to include, but not limited to: reaction to anesthesia, damage to adjacent organs, infection and excessive bleeding. The patient verbalized understanding of these risks and has consented to proceed with Hysteroscopy, Dilatation, Curettage with Myosure and Hydro-Thermal Ablation  at [facility] [place] on 12/26/14 at 1:30 p.m.  CSN# [id number]   Westyn Keatley J. Florene Glen, PA-C  for Dr. Franklyn Lor. Dillard

## 2014-12-26 NOTE — Anesthesia Procedure Notes (Signed)
Procedure Name: LMA Insertion Date/Time: 12/26/2014 1:20 PM Performed by: Raenette Rover Pre-anesthesia Checklist: Patient identified, Patient being monitored, Emergency Drugs available and Suction available Patient Re-evaluated:Patient Re-evaluated prior to inductionOxygen Delivery Method: Circle system utilized Preoxygenation: Pre-oxygenation with 100% oxygen Intubation Type: IV induction Ventilation: Mask ventilation without difficulty LMA: LMA inserted LMA Size: 4.0 Number of attempts: 1 Placement Confirmation: CO2 detector,  positive ETCO2 and breath sounds checked- equal and bilateral Tube secured with: Tape Dental Injury: Teeth and Oropharynx as per pre-operative assessment

## 2014-12-26 NOTE — Op Note (Signed)
Preop Diagnosis: Irregular Menstruation   Postop Diagnosis: Irregular Menstruation   Procedure: DILATATION & CURETTAGE/HYSTEROSCOPY WITH MYOSURE DILATATION & CURETTAGE/HYSTEROSCOPY WITH HYDROTHERMAL ABLATION   Anesthesia: General   Anesthesiologist: No responsible provider has been recorded for the case.   Attending: Crawford Givens, MD   Assistant:none  Findings: polyp on the right side of the uterus and small fibroid on the posterior wall of the uterus Pathology: polyp and fibroid  Fluids: 12000cc  UOP: 75 cc  EBL: minimal  Complications:  Procedure: The patient was taken to the operating room after risks benefits and alternatives were discussed with patient, the patient verbalized understanding and consent signed and witnessed. The patient was placed under general anesthesia and prepped and draped in normal sterile fashion. A bivalve speculum was placed in the patient's vagina and the anterior lip of the cervix was grasped with a single-tooth tenaculum. The cervix was dilated for passage of the hysteroscope. The uterus sounded to 9.  The hysteroscope was introduced into the uterine cavity with findings as noted above. A curettage was performed and currettings sent to pathology. The hysteroscope was reintroduced and hydrothermal ablation was performed without difficulty. The tenaculum and bivalve speculum were removed and there was good hemostasis at the tenaculum sites. Sponge lap and needle count was correct. The patient tolerated procedure well and was returned to the recovery room in good condition.

## 2014-12-27 ENCOUNTER — Encounter (HOSPITAL_COMMUNITY): Payer: Self-pay | Admitting: Obstetrics and Gynecology

## 2015-01-18 ENCOUNTER — Ambulatory Visit: Payer: Medicare Other

## 2015-01-19 ENCOUNTER — Ambulatory Visit: Payer: Medicare Other

## 2015-04-11 ENCOUNTER — Emergency Department (HOSPITAL_BASED_OUTPATIENT_CLINIC_OR_DEPARTMENT_OTHER)
Admission: EM | Admit: 2015-04-11 | Discharge: 2015-04-11 | Disposition: A | Discharge status: Home / Self Care | Payer: Medicare Other | Attending: Emergency Medicine | Admitting: Emergency Medicine

## 2015-04-11 ENCOUNTER — Encounter (HOSPITAL_BASED_OUTPATIENT_CLINIC_OR_DEPARTMENT_OTHER): Payer: Self-pay | Admitting: *Deleted

## 2015-04-11 DIAGNOSIS — E039 Hypothyroidism, unspecified: Secondary | ICD-10-CM | POA: Diagnosis not present

## 2015-04-11 DIAGNOSIS — Y9389 Activity, other specified: Secondary | ICD-10-CM | POA: Diagnosis not present

## 2015-04-11 DIAGNOSIS — Z872 Personal history of diseases of the skin and subcutaneous tissue: Secondary | ICD-10-CM | POA: Diagnosis not present

## 2015-04-11 DIAGNOSIS — Z880 Allergy status to penicillin: Secondary | ICD-10-CM | POA: Insufficient documentation

## 2015-04-11 DIAGNOSIS — F319 Bipolar disorder, unspecified: Secondary | ICD-10-CM | POA: Insufficient documentation

## 2015-04-11 DIAGNOSIS — Z791 Long term (current) use of non-steroidal anti-inflammatories (NSAID): Secondary | ICD-10-CM | POA: Insufficient documentation

## 2015-04-11 DIAGNOSIS — K219 Gastro-esophageal reflux disease without esophagitis: Secondary | ICD-10-CM | POA: Diagnosis not present

## 2015-04-11 DIAGNOSIS — Z720 Tobacco use: Secondary | ICD-10-CM | POA: Insufficient documentation

## 2015-04-11 DIAGNOSIS — Z87448 Personal history of other diseases of urinary system: Secondary | ICD-10-CM | POA: Diagnosis not present

## 2015-04-11 DIAGNOSIS — F419 Anxiety disorder, unspecified: Secondary | ICD-10-CM | POA: Insufficient documentation

## 2015-04-11 DIAGNOSIS — Y9289 Other specified places as the place of occurrence of the external cause: Secondary | ICD-10-CM | POA: Diagnosis not present

## 2015-04-11 DIAGNOSIS — Z79899 Other long term (current) drug therapy: Secondary | ICD-10-CM | POA: Diagnosis not present

## 2015-04-11 DIAGNOSIS — X58XXXA Exposure to other specified factors, initial encounter: Secondary | ICD-10-CM | POA: Insufficient documentation

## 2015-04-11 DIAGNOSIS — Y998 Other external cause status: Secondary | ICD-10-CM | POA: Diagnosis not present

## 2015-04-11 DIAGNOSIS — J45909 Unspecified asthma, uncomplicated: Secondary | ICD-10-CM | POA: Diagnosis not present

## 2015-04-11 DIAGNOSIS — Z8701 Personal history of pneumonia (recurrent): Secondary | ICD-10-CM | POA: Insufficient documentation

## 2015-04-11 DIAGNOSIS — S80821A Blister (nonthermal), right lower leg, initial encounter: Secondary | ICD-10-CM | POA: Diagnosis not present

## 2015-04-11 NOTE — ED Provider Notes (Signed)
CSN: [id number]     Arrival date & time 04/11/15  1952 History   First MD Initiated Contact with Patient 04/11/15 2043     Chief Complaint  Patient presents with  . Insect Bite     (Consider location/radiation/quality/duration/timing/severity/associated sxs/prior Treatment) HPI Comments: 44 year old female presenting with a possible insect bite to her right lower leg 1 week. She believes she was bit by a bug, however did not see anything or feel something bite her. She presented to [facility] urgent care one week ago and was told she had a possible brown recluse spider bite, and if her symptoms worsened been to go to the emergency room. Patient reports the area has not significantly changed, is not draining. No aggravating or alleviating factors. Denies fever or chills. No known injury or trauma.  The history is provided by the patient.    Past Medical History  Diagnosis Date  . Interstitial cystitis   . Bipolar 1 disorder   . Acid reflux   . Hypothyroidism   . Hypothyroid   . Psoriasis   . Asthma   . Pneumonia   . Anxiety   . Depression   . Seizures     2000, medication related from psychiatry, meds adjusted, none sense then  . DDD (degenerative disc disease)     knees and back (DDD)   Past Surgical History  Procedure Laterality Date  . Total knee arthroplasty      x15  . Bladder pacemaker      x6  . Wisdom tooth extraction    . Dilatation & curettage/hysteroscopy with myosure N/A 12/26/2014    Procedure: DILATATION & CURETTAGE/HYSTEROSCOPY WITH MYOSURE;  Surgeon: Crawford Givens, MD;  Location: [facility];  Service: Gynecology;  Laterality: N/A;  . Dilitation & currettage/hystroscopy with hydrothermal ablation N/A 12/26/2014    Procedure: DILATATION & CURETTAGE/HYSTEROSCOPY WITH HYDROTHERMAL ABLATION;  Surgeon: Crawford Givens, MD;  Location: [facility];  Service: Gynecology;  Laterality: N/A;   Family History  Problem Relation Age of Onset  . Dementia Mother   . Diabetes type II  Sister   . Alcoholism Sister    History  Substance Use Topics  . Smoking status: Current Every Day Smoker -- 1.00 packs/day for 29 years    Types: Cigarettes  . Smokeless tobacco: Never Used  . Alcohol Use: No   OB History    No data available     Review of Systems  Skin: Positive for color change.  All other systems reviewed and are negative.     Allergies  Zyrtec; Allegra; Benadryl; Chantix; Claritin; Clindamycin/lincomycin; Amoxicillin; Lamictal; and Silicone  Home Medications   Prior to Admission medications   Medication Sig Start Date End Date Taking? Authorizing Provider  albuterol (PROVENTIL HFA;VENTOLIN HFA) 108 (90 BASE) MCG/ACT inhaler Inhale 2 puffs into the lungs every 4 (four) hours as needed for wheezing or shortness of breath. 06/06/14   Nicole Pisciotta, PA-C  albuterol-ipratropium (COMBIVENT) 18-103 MCG/ACT inhaler Inhale 1 puff into the lungs every 6 (six) hours as needed for wheezing or shortness of breath.    Historical Provider, MD  ARIPiprazole (ABILIFY) 20 MG tablet Take 20 mg by mouth daily.    Historical Provider, MD  carbamazepine (EQUETRO) 200 MG CP12 12 hr capsule Take 200 mg by mouth daily.    Historical Provider, MD  carbamazepine (EQUETRO) 200 MG CP12 12 hr capsule Take 400 mg by mouth at bedtime.    Historical Provider, MD  desmopressin (DDAVP) 0.2 MG tablet Take  0.4 mg by mouth at bedtime.     Historical Provider, MD  desvenlafaxine (PRISTIQ) 100 MG 24 hr tablet Take 100 mg by mouth daily.    Historical Provider, MD  doxepin (SINEQUAN) 50 MG capsule Take 50-100 mg by mouth at bedtime as needed (for sleep).    Historical Provider, MD  doxycycline (VIBRAMYCIN) 50 MG capsule Take one tablet by mouth twice a day for 7 days 12/26/14   Crawford Givens, MD  esomeprazole (NEXIUM) 40 MG capsule Take 40 mg by mouth daily before breakfast.    Historical Provider, MD  fenofibrate micronized (LOFIBRA) 134 MG capsule Take 134 mg by mouth daily.    Historical  Provider, MD  gabapentin (NEURONTIN) 100 MG capsule Take 100-200 mg by mouth See admin instructions. Take 1 capsule in the morning, and 3 capsules at bedtime.    Historical Provider, MD  HYDROcodone-acetaminophen (NORCO/VICODIN) 5-325 MG per tablet Take 1 tablet by mouth every 6 (six) hours as needed. 12/26/14   Crawford Givens, MD  ibuprofen (ADVIL,MOTRIN) 200 MG tablet Take 400 mg by mouth every 6 (six) hours as needed for moderate pain.     Historical Provider, MD  levothyroxine (SYNTHROID, LEVOTHROID) 50 MCG tablet Take 50 mcg by mouth daily.    Historical Provider, MD  LORazepam (ATIVAN) 1 MG tablet Take 1 mg by mouth every 8 (eight) hours as needed for anxiety.     Historical Provider, MD  meloxicam (MOBIC) 15 MG tablet Take 15 mg by mouth daily.  09/05/13   Historical Provider, MD  oxyCODONE-acetaminophen (PERCOCET) 10-325 MG per tablet Take 1 tablet by mouth daily at 12 noon.     Historical Provider, MD  oxymorphone (OPANA ER) 15 MG 12 hr tablet Take 15 mg by mouth every 12 (twelve) hours.    Historical Provider, MD  temazepam (RESTORIL) 30 MG capsule Take 30-60 mg by mouth at bedtime as needed for sleep.    Historical Provider, MD  tiZANidine (ZANAFLEX) 4 MG tablet Take 4 mg by mouth at bedtime.     Historical Provider, MD   BP 134/73 mmHg  Pulse 69  Temp(Src) 98.1 F (36.7 C) (Oral)  Resp 16  Wt 145 lb (65.772 kg)  SpO2 99% Physical Exam  Constitutional: She is oriented to person, place, and time. She appears well-developed and well-nourished. No distress.  HENT:  Head: Normocephalic and atraumatic.  Mouth/Throat: Oropharynx is clear and moist.  Eyes: Conjunctivae and EOM are normal.  Neck: Normal range of motion. Neck supple.  Cardiovascular: Normal rate, regular rhythm and normal heart sounds.   Pulmonary/Chest: Effort normal and breath sounds normal. No respiratory distress.  Musculoskeletal: Normal range of motion. She exhibits no edema.  Neurological: She is alert and oriented  to person, place, and time. No sensory deficit.  Skin: Skin is warm and dry.  1 cm x 2 cm blister to lateral aspect of right lower leg with 2 mm surrounding erythema. No tenderness or drainage.  Psychiatric: She has a normal mood and affect. Her behavior is normal.  Nursing note and vitals reviewed.   ED Course  Procedures (including critical care time) INCISION AND DRAINAGE Performed by: Lucien Mons Consent: Verbal consent obtained. Risks and benefits: risks, benefits and alternatives were discussed Type: blister  Body area: right lower leg  Anesthesia: none  Complexity: simple  Drainage: clear  Drainage amount: large  Packing material: none  Patient tolerance: Patient tolerated the procedure well with no immediate complications.   Labs Review Labs Reviewed -  No data to display  Imaging Review No results found.   EKG Interpretation None      MDM   Final diagnoses:  Blister of left lower leg, initial encounter   Non-toxic appearing, NAD. AFVSS. The area on her leg appears as a blister. This does not appear like a bug bite. Using an 18-gauge needle, I popped the blister and allowed for clear drainage. No cellulitis. I advised patient to use warm compresses and follow-up with her PCP in 1-2 days for recheck. Stable for discharge. Return precautions given. Patient states understanding of treatment care plan and is agreeable.  Carman Ching, PA-C 04/11/15 2117  Ripley Fraise, MD 04/11/15 2322

## 2015-04-11 NOTE — ED Notes (Signed)
Pt c/o insect bite to right lower leg x 1 week ago

## 2015-04-11 NOTE — Discharge Instructions (Signed)
Apply warm compresses to the blister.  Blisters Blisters are fluid-filled sacs that form within the skin. Common causes of blistering are friction, burns, and exposure to irritating chemicals. The fluid in the blister protects the underlying damaged skin. Most of the time it is not recommended that you open blisters. When a blister is opened, there is an increased chance for infection. Usually, a blister will open on its own. They then dry up and peel off within 10 days. If the blister is tense and uncomfortable (painful) the fluid may be drained. If it is drained the roof of the blister should be left intact. The draining should only be done by a medical professional under aseptic conditions. Poorly fitting shoes and boots can cause blisters by being too tight or too loose. Wearing extra socks or using tape, bandages, or pads over the blister-prone area helps prevent the problem by reducing friction. Blisters heal more slowly if you have diabetes or if you have problems with your circulation. You need to be careful about medical follow-up to prevent infection. HOME CARE INSTRUCTIONS  Protect areas where blisters have formed until the skin is healed. Use a special bandage with a hole cut in the middle around the blister. This reduces pressure and friction. When the blister breaks, trim off the loose skin and keep the area clean by washing it with soap daily. Soaking the blister or broken-open blister with diluted vinegar twice daily for 15 minutes will dry it up and speed the healing. Use 3 tablespoons of white vinegar per quart of water (45 mL white vinegar per liter of water). An antibiotic ointment and a bandage can be used to cover the area after soaking.  SEEK MEDICAL CARE IF:   You develop increased redness, pain, swelling, or drainage in the blistered area.  You develop a pus-like discharge from the blistered area, chills, or a fever. MAKE SURE YOU:   Understand these instructions.  Will watch  your condition.  Will get help right away if you are not doing well or get worse. Document Released: 12/25/2004 Document Revised: 02/09/2012 Document Reviewed: 11/22/2008 [facility] Patient Information 2015 [place], [place]. This information is not intended to replace advice given to you by your health care provider. Make sure you discuss any questions you have with your health care provider.

## 2015-06-12 ENCOUNTER — Emergency Department (HOSPITAL_COMMUNITY)
Admission: EM | Admit: 2015-06-12 | Discharge: 2015-06-13 | Disposition: A | Discharge status: Other Acute Inpatient Hospital | Payer: Medicare Other | Attending: Emergency Medicine | Admitting: Emergency Medicine

## 2015-06-12 ENCOUNTER — Encounter (HOSPITAL_COMMUNITY): Payer: Self-pay | Admitting: Emergency Medicine

## 2015-06-12 DIAGNOSIS — Z8739 Personal history of other diseases of the musculoskeletal system and connective tissue: Secondary | ICD-10-CM | POA: Insufficient documentation

## 2015-06-12 DIAGNOSIS — G40909 Epilepsy, unspecified, not intractable, without status epilepticus: Secondary | ICD-10-CM | POA: Insufficient documentation

## 2015-06-12 DIAGNOSIS — T424X2A Poisoning by benzodiazepines, intentional self-harm, initial encounter: Secondary | ICD-10-CM | POA: Insufficient documentation

## 2015-06-12 DIAGNOSIS — F315 Bipolar disorder, current episode depressed, severe, with psychotic features: Secondary | ICD-10-CM | POA: Diagnosis not present

## 2015-06-12 DIAGNOSIS — F419 Anxiety disorder, unspecified: Secondary | ICD-10-CM | POA: Diagnosis not present

## 2015-06-12 DIAGNOSIS — J45909 Unspecified asthma, uncomplicated: Secondary | ICD-10-CM | POA: Diagnosis not present

## 2015-06-12 DIAGNOSIS — F332 Major depressive disorder, recurrent severe without psychotic features: Secondary | ICD-10-CM | POA: Diagnosis present

## 2015-06-12 DIAGNOSIS — K219 Gastro-esophageal reflux disease without esophagitis: Secondary | ICD-10-CM | POA: Diagnosis not present

## 2015-06-12 DIAGNOSIS — Z8701 Personal history of pneumonia (recurrent): Secondary | ICD-10-CM | POA: Diagnosis not present

## 2015-06-12 DIAGNOSIS — Z791 Long term (current) use of non-steroidal anti-inflammatories (NSAID): Secondary | ICD-10-CM | POA: Insufficient documentation

## 2015-06-12 DIAGNOSIS — E039 Hypothyroidism, unspecified: Secondary | ICD-10-CM | POA: Diagnosis not present

## 2015-06-12 DIAGNOSIS — Z79899 Other long term (current) drug therapy: Secondary | ICD-10-CM | POA: Diagnosis not present

## 2015-06-12 DIAGNOSIS — Z87448 Personal history of other diseases of urinary system: Secondary | ICD-10-CM | POA: Diagnosis not present

## 2015-06-12 DIAGNOSIS — T50902A Poisoning by unspecified drugs, medicaments and biological substances, intentional self-harm, initial encounter: Secondary | ICD-10-CM

## 2015-06-12 DIAGNOSIS — Z720 Tobacco use: Secondary | ICD-10-CM | POA: Insufficient documentation

## 2015-06-12 DIAGNOSIS — Z872 Personal history of diseases of the skin and subcutaneous tissue: Secondary | ICD-10-CM | POA: Insufficient documentation

## 2015-06-12 DIAGNOSIS — F314 Bipolar disorder, current episode depressed, severe, without psychotic features: Secondary | ICD-10-CM

## 2015-06-12 DIAGNOSIS — T1491XA Suicide attempt, initial encounter: Secondary | ICD-10-CM

## 2015-06-12 LAB — COMPREHENSIVE METABOLIC PANEL
ALT: 41 U/L (ref 14–54)
ANION GAP: 8 (ref 5–15)
AST: 38 U/L (ref 15–41)
Albumin: 4.1 g/dL (ref 3.5–5.0)
Alkaline Phosphatase: 134 U/L — ABNORMAL HIGH (ref 38–126)
BILIRUBIN TOTAL: 0.3 mg/dL (ref 0.3–1.2)
BUN: 9 mg/dL (ref 6–20)
CO2: 25 mmol/L (ref 22–32)
CREATININE: 0.68 mg/dL (ref 0.44–1.00)
Calcium: 9.6 mg/dL (ref 8.9–10.3)
Chloride: 105 mmol/L (ref 101–111)
GFR calc Af Amer: >60 mL/min (ref >60)
GFR calc non Af Amer: >60 mL/min (ref >60)
Glucose, Bld: 117 mg/dL — ABNORMAL HIGH (ref 65–99)
Potassium: 3.9 mmol/L (ref 3.5–5.1)
Sodium: 138 mmol/L (ref 135–145)
TOTAL PROTEIN: 7.6 g/dL (ref 6.5–8.1)

## 2015-06-12 LAB — ACETAMINOPHEN LEVEL

## 2015-06-12 LAB — CBC
HCT: 41.7 % (ref 36.0–46.0)
HEMOGLOBIN: 13.6 g/dL (ref 12.0–15.0)
MCH: 26.6 pg (ref 26.0–34.0)
MCHC: 32.6 g/dL (ref 30.0–36.0)
MCV: 81.4 fL (ref 78.0–100.0)
Platelets: 259 10*3/uL (ref 150–400)
RBC: 5.12 MIL/uL — ABNORMAL HIGH (ref 3.87–5.11)
RDW: 13.6 % (ref 11.5–15.5)
WBC: 10 10*3/uL (ref 4.0–10.5)

## 2015-06-12 LAB — RAPID URINE DRUG SCREEN, HOSP PERFORMED
AMPHETAMINES: NOT DETECTED
BARBITURATES: NOT DETECTED
BENZODIAZEPINES: NOT DETECTED
COCAINE: NOT DETECTED
OPIATES: NOT DETECTED
TETRAHYDROCANNABINOL: NOT DETECTED

## 2015-06-12 LAB — ETHANOL: Alcohol, Ethyl (B): <5 mg/dL (ref <5)

## 2015-06-12 LAB — SALICYLATE LEVEL: Salicylate Lvl: <4 mg/dL (ref 2.8–30.0)

## 2015-06-12 MED ORDER — FENTANYL 25 MCG/HR TD PT72
25.0000 ug | MEDICATED_PATCH | TRANSDERMAL | Status: DC
  Administered 2015-06-12: 25 ug via TRANSDERMAL
  Filled 2015-06-12: qty 1

## 2015-06-12 MED ORDER — LEVOTHYROXINE SODIUM 50 MCG PO TABS
50.0000 ug | ORAL_TABLET | Freq: Every day | ORAL | Status: DC
  Administered 2015-06-13: 50 ug via ORAL
  Filled 2015-06-12 (×2): qty 1

## 2015-06-12 MED ORDER — LEVOMILNACIPRAN HCL ER 80 MG PO CP24
80.0000 mg | ORAL_CAPSULE | Freq: Every day | ORAL | Status: DC
  Filled 2015-06-12 (×2): qty 1

## 2015-06-12 MED ORDER — PANTOPRAZOLE SODIUM 40 MG PO TBEC
40.0000 mg | DELAYED_RELEASE_TABLET | Freq: Every day | ORAL | Status: DC
  Administered 2015-06-12 – 2015-06-13 (×2): 40 mg via ORAL
  Filled 2015-06-12 (×2): qty 1

## 2015-06-12 MED ORDER — GABAPENTIN 300 MG PO CAPS
900.0000 mg | ORAL_CAPSULE | Freq: Every day | ORAL | Status: DC
Start: 2015-06-12 — End: 2015-06-13
  Administered 2015-06-12: 900 mg via ORAL
  Filled 2015-06-12: qty 3

## 2015-06-12 MED ORDER — NICOTINE 21 MG/24HR TD PT24
MEDICATED_PATCH | TRANSDERMAL | Status: AC
  Administered 2015-06-12: 21 mg
  Filled 2015-06-12: qty 1

## 2015-06-12 MED ORDER — DESMOPRESSIN ACETATE 0.2 MG PO TABS
0.4000 mg | ORAL_TABLET | Freq: Every day | ORAL | Status: DC
  Administered 2015-06-12: 0.4 mg via ORAL
  Filled 2015-06-12 (×2): qty 2

## 2015-06-12 MED ORDER — ASENAPINE MALEATE 5 MG SL SUBL
5.0000 mg | SUBLINGUAL_TABLET | Freq: Every day | SUBLINGUAL | Status: DC
  Administered 2015-06-12 – 2015-06-13 (×2): 5 mg via SUBLINGUAL
  Filled 2015-06-12 (×2): qty 1

## 2015-06-12 MED ORDER — GABAPENTIN 300 MG PO CAPS: 300.0000 mg | ORAL_CAPSULE | Freq: Three times a day (TID) | ORAL | Status: DC

## 2015-06-12 MED ORDER — GABAPENTIN 300 MG PO CAPS
300.0000 mg | ORAL_CAPSULE | ORAL | Status: DC
  Administered 2015-06-13 (×2): 300 mg via ORAL
  Filled 2015-06-12 (×2): qty 1

## 2015-06-12 MED ORDER — TIZANIDINE HCL 4 MG PO TABS
4.0000 mg | ORAL_TABLET | Freq: Three times a day (TID) | ORAL | Status: DC | PRN
  Administered 2015-06-12: 4 mg via ORAL
  Filled 2015-06-12: qty 1

## 2015-06-12 NOTE — BH Assessment (Signed)
[facility] Assessment Progress Note    Called and scheduled this pt's tele assessment with this clinician.  Called EDP [facility], not currently available, will call after pt's assessment.  Shaune Pascal, MS, [facility] Therapeutic Triage Specialist [facility]

## 2015-06-12 NOTE — ED Notes (Signed)
Bed: [facility] Expected date:  Expected time:  Means of arrival:  Comments: EMS-overdose

## 2015-06-12 NOTE — ED Notes (Signed)
Pt states took 20 mg of clonazepam with thoughts of hurting self related to sister's recent sickness. At present time pt denies SI/HI. Pt alert and oriented x4 and VS WNL.

## 2015-06-12 NOTE — ED Provider Notes (Addendum)
CSN: [id number]     Arrival date & time 06/12/15  1321 History   First MD Initiated Contact with Patient 06/12/15 1328     Chief Complaint  Patient presents with  . Drug Overdose     (Consider location/radiation/quality/duration/timing/severity/associated sxs/prior Treatment) Patient is a 44 y.o. female presenting with Overdose. The history is provided by the patient and the EMS personnel.  Drug Overdose Pertinent negatives include no chest pain, no abdominal pain, no headaches and no shortness of breath.  Patient w hx depression, c/o worsening depression in the past couple days, and overdose on her klonopin approximately 2 hrs ago. States feels severely depressed. Symptoms persistent, constant. Pt states took approximately 20 klonopin tablets 2 hrs ago. Denies other ingestion. Pt indicates depressed about sister dying 6 months ago, and 'girlfriend' leaving her today. States physical health at baseline, no recent illness or other acute symptoms. Denies headache. No chest pain or sob. No abd pain. No nvd. Denies fever or chills.    Past Medical History  Diagnosis Date  . Interstitial cystitis   . Bipolar 1 disorder   . Acid reflux   . Hypothyroidism   . Hypothyroid   . Psoriasis   . Asthma   . Pneumonia   . Anxiety   . Depression   . Seizures     2000, medication related from psychiatry, meds adjusted, none sense then  . DDD (degenerative disc disease)     knees and back (DDD)   Past Surgical History  Procedure Laterality Date  . Total knee arthroplasty      x15  . Bladder pacemaker      x6  . Wisdom tooth extraction    . Dilatation & curettage/hysteroscopy with myosure N/A 12/26/2014    Procedure: DILATATION & CURETTAGE/HYSTEROSCOPY WITH MYOSURE;  Surgeon: Crawford Givens, MD;  Location: [facility];  Service: Gynecology;  Laterality: N/A;  . Dilitation & currettage/hystroscopy with hydrothermal ablation N/A 12/26/2014    Procedure: DILATATION & CURETTAGE/HYSTEROSCOPY WITH  HYDROTHERMAL ABLATION;  Surgeon: Crawford Givens, MD;  Location: [facility];  Service: Gynecology;  Laterality: N/A;   Family History  Problem Relation Age of Onset  . Dementia Mother   . Diabetes type II Sister   . Alcoholism Sister    History  Substance Use Topics  . Smoking status: Current Every Day Smoker -- 1.00 packs/day for 29 years    Types: Cigarettes  . Smokeless tobacco: Never Used  . Alcohol Use: No   OB History    No data available     Review of Systems  Constitutional: Negative for fever and chills.  HENT: Negative for sore throat.   Eyes: Negative for visual disturbance.  Respiratory: Negative for shortness of breath.   Cardiovascular: Negative for chest pain.  Gastrointestinal: Negative for vomiting, abdominal pain and diarrhea.  Endocrine: Negative for polyuria.  Genitourinary: Negative for flank pain.  Musculoskeletal: Negative for back pain and neck pain.  Skin: Negative for rash.  Neurological: Negative for weakness, numbness and headaches.  Hematological: Does not bruise/bleed easily.  Psychiatric/Behavioral: Positive for suicidal ideas and dysphoric mood.      Allergies  Diphenhydramine; Silicone; Zyrtec; Allegra; Amoxicillin; Claritin; Clindamycin/lincomycin; Benadryl; Chantix; and Lamictal  Home Medications   Prior to Admission medications   Medication Sig Start Date End Date Taking? Authorizing Provider  albuterol (PROVENTIL HFA;VENTOLIN HFA) 108 (90 BASE) MCG/ACT inhaler Inhale 2 puffs into the lungs every 4 (four) hours as needed for wheezing or shortness of breath. 06/06/14  Yes  Nicole Pisciotta, PA-C  asenapine (SAPHRIS) 5 MG SUBL 24 hr tablet Place 5 mg under the tongue 2 (two) times daily.   Yes Historical Provider, MD  asenapine (SAPHRIS) 5 MG SUBL 24 hr tablet Place 5 mg under the tongue daily. 02/02/15  Yes Historical Provider, MD  clonazePAM (KLONOPIN) 1 MG tablet Take 1 mg by mouth 3 (three) times daily.   Yes Historical Provider, MD   desmopressin (DDAVP) 0.2 MG tablet Take 0.4 mg by mouth at bedtime.    Yes Historical Provider, MD  diclofenac sodium (VOLTAREN) 1 % GEL Apply 1 application topically once a week.   Yes Historical Provider, MD  esomeprazole (NEXIUM) 40 MG capsule Take 40 mg by mouth daily before breakfast.   Yes Historical Provider, MD  fentaNYL (DURAGESIC - DOSED MCG/HR) 25 MCG/HR patch Place 25 mcg onto the skin every 3 (three) days.   Yes Historical Provider, MD  gabapentin (NEURONTIN) 300 MG capsule Take 1,200 mg by mouth 2 (two) times daily. Patient takes one capsule in the morning and 3 capsule at night   Yes Historical Provider, MD  gabapentin (NEURONTIN) 300 MG capsule Take 1,200 mg by mouth 3 (three) times daily. Take 1 Capsule PO every morning, 1 Capsule PO at noon, and 2 capsules every night at bedtime. 04/28/15  Yes Historical Provider, MD  Levomilnacipran HCl ER (FETZIMA TITRATION) 20 & 40 MG C4PK Take 20 mg by mouth daily.   Yes Historical Provider, MD  levothyroxine (SYNTHROID, LEVOTHROID) 50 MCG tablet Take 50 mcg by mouth daily.   Yes Historical Provider, MD  meloxicam (MOBIC) 15 MG tablet Take 15 mg by mouth daily.  09/05/13  Yes Historical Provider, MD  meloxicam (MOBIC) 15 MG tablet Take 15 mg by mouth daily. 03/27/15 09/23/15 Yes Historical Provider, MD  OXYBUTYNIN TD Place 15 mg onto the skin daily.   Yes Historical Provider, MD  oxyCODONE-acetaminophen (PERCOCET) 10-325 MG per tablet Take 1 tablet by mouth daily at 12 noon.    Yes Historical Provider, MD  oxyCODONE-acetaminophen (PERCOCET) 10-325 MG per tablet Take 10-325 mg by mouth daily as needed. 06/04/15  Yes Historical Provider, MD  tiZANidine (ZANAFLEX) 4 MG tablet Take 4 mg by mouth 3 (three) times daily as needed. 06/10/15 12/07/15 Yes Historical Provider, MD  zolpidem (AMBIEN) 10 MG tablet Take 10 mg by mouth at bedtime as needed for sleep.   Yes Historical Provider, MD  albuterol-ipratropium (COMBIVENT) 18-103 MCG/ACT inhaler Inhale 1 puff  into the lungs 4 (four) times daily as needed.    Historical Provider, MD  doxycycline (VIBRAMYCIN) 50 MG capsule Take one tablet by mouth twice a day for 7 days Patient not taking: Reported on 06/12/2015 12/26/14   Crawford Givens, MD  fenofibrate micronized (LOFIBRA) 134 MG capsule Take 134 mg by mouth daily.    Historical Provider, MD  FETZIMA 80 MG CP24 Take 80 mg by mouth daily. 05/30/15   Historical Provider, MD  gabapentin (NEURONTIN) 100 MG capsule Take 100-200 mg by mouth See admin instructions. Take 1 capsule in the morning, and 3 capsules at bedtime.    Historical Provider, MD  HYDROcodone-acetaminophen (NORCO/VICODIN) 5-325 MG per tablet Take 1 tablet by mouth every 6 (six) hours as needed. Patient not taking: Reported on 06/12/2015 12/26/14   Crawford Givens, MD  levothyroxine (SYNTHROID, LEVOTHROID) 50 MCG tablet Take 50 mcg by mouth daily.    Historical Provider, MD  morphine (MS CONTIN) 30 MG 12 hr tablet Take 30 mg by mouth daily. 03/18/15  Historical Provider, MD  oxybutynin (DITROPAN XL) 15 MG 24 hr tablet Take 15 mg by mouth daily. 05/21/15   Historical Provider, MD  tiZANidine (ZANAFLEX) 4 MG tablet Take 4 mg by mouth daily. 05/21/15   Historical Provider, MD   BP 115/88 mmHg  Pulse 89  Temp(Src) 98.3 F (36.8 C) (Oral)  Resp 18  SpO2 97% Physical Exam  Constitutional: She appears well-developed and well-nourished. No distress.  HENT:  Head: Atraumatic.  Mouth/Throat: Oropharynx is clear and moist.  Eyes: Conjunctivae are normal. No scleral icterus.  Neck: Neck supple. No tracheal deviation present.  Cardiovascular: Normal rate, regular rhythm, normal heart sounds and intact distal pulses.   Pulmonary/Chest: Effort normal and breath sounds normal. No respiratory distress.  Abdominal: Soft. Normal appearance and bowel sounds are normal. She exhibits no distension. There is no tenderness.  Musculoskeletal: She exhibits no edema or tenderness.  Neurological: She is alert.   Speech clear, fluent.  Pt fully awake and alert, no resp depression. Motor intact bil. stre 5/5. sens grossly intact.   Skin: Skin is warm and dry. No rash noted. She is not diaphoretic.  Psychiatric:  Depressed mood, +SI.  Nursing note and vitals reviewed.   ED Course  Procedures (including critical care time) Labs Review  Results for orders placed or performed during the hospital encounter of 06/12/15  CBC  Result Value Ref Range   WBC 10.0 4.0 - 10.5 K/uL   RBC 5.12 (H) 3.87 - 5.11 MIL/uL   Hemoglobin 13.6 12.0 - 15.0 g/dL   HCT 41.7 36.0 - 46.0 %   MCV 81.4 78.0 - 100.0 fL   MCH 26.6 26.0 - 34.0 pg   MCHC 32.6 30.0 - 36.0 g/dL   RDW 13.6 11.5 - 15.5 %   Platelets 259 150 - 400 K/uL  Comprehensive metabolic panel  Result Value Ref Range   Sodium 138 135 - 145 mmol/L   Potassium 3.9 3.5 - 5.1 mmol/L   Chloride 105 101 - 111 mmol/L   CO2 25 22 - 32 mmol/L   Glucose, Bld 117 (H) 65 - 99 mg/dL   BUN 9 6 - 20 mg/dL   Creatinine, Ser 0.68 0.44 - 1.00 mg/dL   Calcium 9.6 8.9 - 10.3 mg/dL   Total Protein 7.6 6.5 - 8.1 g/dL   Albumin 4.1 3.5 - 5.0 g/dL   AST 38 15 - 41 U/L   ALT 41 14 - 54 U/L   Alkaline Phosphatase 134 (H) 38 - 126 U/L   Total Bilirubin 0.3 0.3 - 1.2 mg/dL   GFR calc non Af Amer >60 >60 mL/min   GFR calc Af Amer >60 >60 mL/min   Anion gap 8 5 - 15  Ethanol  Result Value Ref Range   Alcohol, Ethyl (B) <5 <5 mg/dL  Urine rapid drug screen (hosp performed)  Result Value Ref Range   Opiates NONE DETECTED NONE DETECTED   Cocaine NONE DETECTED NONE DETECTED   Benzodiazepines NONE DETECTED NONE DETECTED   Amphetamines NONE DETECTED NONE DETECTED   Tetrahydrocannabinol NONE DETECTED NONE DETECTED   Barbiturates NONE DETECTED NONE DETECTED  Acetaminophen level  Result Value Ref Range   Acetaminophen (Tylenol), Serum <10 (L) 10 - 30 ug/mL  Salicylate level  Result Value Ref Range   Salicylate Lvl <2.7 2.8 - 30.0 mg/dL      EKG  Interpretation   Date/Time:  Tuesday June 12 2015 14:28:02 EDT Ventricular Rate:  70 PR Interval:  131 QRS Duration:  75 QT Interval:  396 QTC Calculation: 427 R Axis:   74 Text Interpretation:  Sinus rhythm Artifact in lead(s) II III aVR aVL aVF  V3 V4 V5 V6 Confirmed by Ashok Cordia  MD, Lennette Bihari ([id number]) on 06/12/2015 2:54:17 PM      MDM   Iv ns. Continuous pulse ox and monitor.  Labs.  Reviewed nursing notes and prior charts for additional history.   [facility] team consulted.  Recheck pt - pt awake and alert, no resp depression. bhh evaluation pending.   Temp psych orders placed.  Current meds unknown, pt unclear - await pharmacy to complete med rec.   Disposition per bhh team.  Pt signed out to Dr Alvino Chapel, to f/u with bhh assessment, recheck pt, and dispo appropriately.      Lajean Saver, MD 06/12/15 [phone]

## 2015-06-12 NOTE — ED Notes (Signed)
Poison control called to follow up on pt's status.   No new recommendations at this time.

## 2015-06-12 NOTE — ED Notes (Signed)
Patient has one belonging bag placed in locker 32

## 2015-06-12 NOTE — ED Notes (Signed)
telepsych assessment at bedside

## 2015-06-12 NOTE — ED Notes (Signed)
Pt requesting Nicotine patch as well as her Fentanyl patch that she normally wears at home.   Made Dr Alvino Chapel aware of pt medication request.  Stated that he will have to review her home medication list.  No new orders at this time.
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
## 2015-06-12 NOTE — ED Notes (Addendum)
Blue tshirt, white/grey socks, grey shorts, blue shoes, LG cellphone, headphone, pulsar watch, black bracelet, silver necklace with saint christopher protect [place] charm, and blue bag with medications in pt belongings bag. Invertory of Personal Effects filled out and signed by pt.

## 2015-06-12 NOTE — ED Notes (Addendum)
Per EMS pt with Hx of bipolar disorder states she took 20 pills of 1 mg klonazepam today at about 1215, pt reports suicidal ideation since sister has gotten sick, her girlfriend broke up with her today. Pt states that she did overdose once at age 44. Pt is alert and oriented x 4. Pt had fentanyl patch in place, EMS witnessed pt remove the patch.

## 2015-06-12 NOTE — ED Notes (Signed)
Patient is upset because her neighbor is continually yelling.  She requests Ambien to help her sleep.  Informed patient that Ambien and other sleep aids were contraindicated based on the medications she overdosed on earlier this day.  Understanding verbalized.

## 2015-06-13 ENCOUNTER — Inpatient Hospital Stay (HOSPITAL_COMMUNITY)
Admission: AD | Admit: 2015-06-13 | Discharge: 2015-06-15 | DRG: 885 | Disposition: A | Discharge status: Home / Self Care | Payer: Medicare Other | Source: Intra-hospital | Attending: Psychiatry | Admitting: Psychiatry

## 2015-06-13 ENCOUNTER — Encounter (HOSPITAL_COMMUNITY): Payer: Self-pay | Admitting: General Practice

## 2015-06-13 DIAGNOSIS — F17210 Nicotine dependence, cigarettes, uncomplicated: Secondary | ICD-10-CM | POA: Diagnosis present

## 2015-06-13 DIAGNOSIS — T1491XA Suicide attempt, initial encounter: Secondary | ICD-10-CM | POA: Insufficient documentation

## 2015-06-13 DIAGNOSIS — F410 Panic disorder [episodic paroxysmal anxiety] without agoraphobia: Secondary | ICD-10-CM | POA: Diagnosis present

## 2015-06-13 DIAGNOSIS — G4700 Insomnia, unspecified: Secondary | ICD-10-CM | POA: Diagnosis present

## 2015-06-13 DIAGNOSIS — E039 Hypothyroidism, unspecified: Secondary | ICD-10-CM | POA: Diagnosis present

## 2015-06-13 DIAGNOSIS — Z811 Family history of alcohol abuse and dependence: Secondary | ICD-10-CM | POA: Diagnosis not present

## 2015-06-13 DIAGNOSIS — T424X2A Poisoning by benzodiazepines, intentional self-harm, initial encounter: Secondary | ICD-10-CM | POA: Diagnosis not present

## 2015-06-13 DIAGNOSIS — Z833 Family history of diabetes mellitus: Secondary | ICD-10-CM | POA: Diagnosis not present

## 2015-06-13 DIAGNOSIS — F332 Major depressive disorder, recurrent severe without psychotic features: Secondary | ICD-10-CM

## 2015-06-13 DIAGNOSIS — R45851 Suicidal ideations: Secondary | ICD-10-CM | POA: Diagnosis present

## 2015-06-13 DIAGNOSIS — F314 Bipolar disorder, current episode depressed, severe, without psychotic features: Secondary | ICD-10-CM | POA: Diagnosis not present

## 2015-06-13 MED ORDER — MAGNESIUM HYDROXIDE 400 MG/5ML PO SUSP: 30.0000 mL | Freq: Every day | ORAL | Status: DC | PRN

## 2015-06-13 MED ORDER — OXYBUTYNIN CHLORIDE ER 15 MG PO TB24
15.0000 mg | ORAL_TABLET | Freq: Every day | ORAL | Status: DC
  Administered 2015-06-13 – 2015-06-14 (×2): 15 mg via ORAL
  Filled 2015-06-13 (×4): qty 1

## 2015-06-13 MED ORDER — GABAPENTIN 300 MG PO CAPS
900.0000 mg | ORAL_CAPSULE | Freq: Every day | ORAL | Status: DC
  Filled 2015-06-13: qty 3

## 2015-06-13 MED ORDER — TIZANIDINE HCL 2 MG PO TABS: 4.0000 mg | ORAL_TABLET | Freq: Three times a day (TID) | ORAL | Status: DC | PRN

## 2015-06-13 MED ORDER — PANTOPRAZOLE SODIUM 40 MG PO TBEC
40.0000 mg | DELAYED_RELEASE_TABLET | Freq: Every day | ORAL | Status: DC
  Administered 2015-06-14 – 2015-06-15 (×2): 40 mg via ORAL
  Filled 2015-06-13 (×5): qty 1

## 2015-06-13 MED ORDER — LORAZEPAM 1 MG PO TABS
1.0000 mg | ORAL_TABLET | Freq: Four times a day (QID) | ORAL | Status: DC | PRN
  Administered 2015-06-13: 1 mg via ORAL
  Filled 2015-06-13: qty 1

## 2015-06-13 MED ORDER — ALUM & MAG HYDROXIDE-SIMETH 200-200-20 MG/5ML PO SUSP: 30.0000 mL | ORAL | Status: DC | PRN

## 2015-06-13 MED ORDER — ASENAPINE MALEATE 5 MG SL SUBL
5.0000 mg | SUBLINGUAL_TABLET | Freq: Every day | SUBLINGUAL | Status: DC
  Administered 2015-06-14 – 2015-06-15 (×2): 5 mg via SUBLINGUAL
  Filled 2015-06-13 (×4): qty 1

## 2015-06-13 MED ORDER — FENTANYL 12 MCG/HR TD PT72: 25.0000 ug | MEDICATED_PATCH | TRANSDERMAL | Status: DC

## 2015-06-13 MED ORDER — TRAZODONE HCL 50 MG PO TABS: 50.0000 mg | ORAL_TABLET | Freq: Every evening | ORAL | Status: DC | PRN

## 2015-06-13 MED ORDER — LEVOTHYROXINE SODIUM 50 MCG PO TABS
50.0000 ug | ORAL_TABLET | Freq: Every day | ORAL | Status: DC
  Administered 2015-06-14 – 2015-06-15 (×2): 50 ug via ORAL
  Filled 2015-06-13 (×2): qty 1
  Filled 2015-06-13: qty 2
  Filled 2015-06-13: qty 1

## 2015-06-13 MED ORDER — LEVOMILNACIPRAN HCL ER 80 MG PO CP24
80.0000 mg | ORAL_CAPSULE | Freq: Every day | ORAL | Status: DC
  Administered 2015-06-14 – 2015-06-15 (×2): 80 mg via ORAL

## 2015-06-13 MED ORDER — ACETAMINOPHEN 325 MG PO TABS: 650.0000 mg | ORAL_TABLET | Freq: Four times a day (QID) | ORAL | Status: DC | PRN

## 2015-06-13 MED ORDER — GABAPENTIN 300 MG PO CAPS
300.0000 mg | ORAL_CAPSULE | ORAL | Status: DC
  Filled 2015-06-13: qty 1

## 2015-06-13 MED ORDER — GABAPENTIN 300 MG PO CAPS
900.0000 mg | ORAL_CAPSULE | Freq: Every day | ORAL | Status: DC
  Administered 2015-06-14: 900 mg via ORAL
  Filled 2015-06-13 (×4): qty 3

## 2015-06-13 MED ORDER — LORAZEPAM 1 MG PO TABS
1.0000 mg | ORAL_TABLET | Freq: Four times a day (QID) | ORAL | Status: DC | PRN
  Administered 2015-06-14: 1 mg via ORAL
  Filled 2015-06-13: qty 1

## 2015-06-13 MED ORDER — NICOTINE 21 MG/24HR TD PT24
21.0000 mg | MEDICATED_PATCH | Freq: Every day | TRANSDERMAL | Status: DC
  Administered 2015-06-14 – 2015-06-15 (×2): 21 mg via TRANSDERMAL
  Filled 2015-06-13 (×3): qty 1

## 2015-06-13 MED ORDER — GABAPENTIN 300 MG PO CAPS
300.0000 mg | ORAL_CAPSULE | Freq: Every day | ORAL | Status: DC
  Administered 2015-06-13: 300 mg via ORAL
  Filled 2015-06-13 (×4): qty 1

## 2015-06-13 MED ORDER — DESMOPRESSIN ACETATE 0.2 MG PO TABS
0.4000 mg | ORAL_TABLET | Freq: Every day | ORAL | Status: DC
  Administered 2015-06-13 – 2015-06-14 (×2): 0.4 mg via ORAL
  Filled 2015-06-13 (×3): qty 2
  Filled 2015-06-13: qty 4

## 2015-06-13 MED ORDER — TRAZODONE HCL 50 MG PO TABS
50.0000 mg | ORAL_TABLET | Freq: Every evening | ORAL | Status: DC | PRN
  Administered 2015-06-13 – 2015-06-14 (×3): 50 mg via ORAL
  Filled 2015-06-13 (×7): qty 1

## 2015-06-13 NOTE — BH Assessment (Signed)
IXL Assessment Progress Note  Per Corena Pilgrim, MD, this pt requires psychiatric hospitalization at this time.  Debarah Crape, RN, [facility] has assigned pt to [facility] Rm 403-1.  Pt presents under voluntary status, and was asked to sign Voluntary Admission and Consent for Treatment, as well as Consent to Release Information to her outpatient providers at [facility].  However, she refused both.  This was staffed with Charmaine Downs, NP and with EDP Blanchie Dessert, MD.  They concur that pt meets criteria for IVC, which Dr Maryan Rued has initiated.  Pt's nurse has been notified and agrees to call report to [phone].  Pt is to be transported to [facility] via [employer].  Jalene Mullet, [place] Triage Specialist [phone]

## 2015-06-13 NOTE — Tx Team (Signed)
Initial Interdisciplinary Treatment Plan   PATIENT STRESSORS: Loss of significant relationship   PATIENT STRENGTHS: Average or above average intelligence General fund of knowledge   PROBLEM LIST: Problem List/Patient Goals Date to be addressed Date deferred Reason deferred Estimated date of resolution  Depression 06/13/2015           Risk for Suicide 06/13/2015           "Go home" 06/13/2015           "Become a rock star"- stated sarcastically  06/13/2015                  DISCHARGE CRITERIA:  Ability to meet basic life and health needs Improved stabilization in mood, thinking, and/or behavior Motivation to continue treatment in a less acute level of care Verbal commitment to aftercare and medication compliance  PRELIMINARY DISCHARGE PLAN: Attend PHP/IOP Outpatient therapy Return to previous living arrangement  PATIENT/FAMIILY INVOLVEMENT: This treatment plan has been presented to and reviewed with the patient, [name].  The patient and family have been given the opportunity to ask questions and make suggestions.  Gaylan Gerold E 06/13/2015, 6:54 PM

## 2015-06-13 NOTE — Progress Notes (Signed)
Adult Psychoeducational Group Note  Date:  06/13/2015 Time:  10:41 PM  Group Topic/Focus:  Wrap-Up Group:   The focus of this group is to help patients review their daily goal of treatment and discuss progress on daily workbooks.  Participation Level:  Minimal  Participation Quality:  Appropriate  Affect:  Blunted  Cognitive:  Lacking  Insight: Limited  Engagement in Group:  Engaged  Modes of Intervention:  Socialization and Support  Additional Comments:  Patient attended and participated in group tonight. She reports that she just came in. Her mom visited with her.  Salley Scarlet [facility] 06/13/2015, 10:41 PM

## 2015-06-13 NOTE — ED Notes (Signed)
Patient informed sitter that she would like some medication for anxiety.  Patient reminded by RN that, at this time, she will not be receiving any medications for anxiety due to her reason for being here.
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
## 2015-06-13 NOTE — Progress Notes (Signed)
Patient ID: [name], female   DOB: 1971-03-14, 44 y.o.   MRN: [id number]  [name] is a 43 year old caucasian female admitted to [facility] under involuntarily commitment post intentional overdose of Clonazepam. Patient reports her sister passed away 6 months ago and her girlfriend just broke up with her. On admission patient minimized her overdose and was angry about being admitted to [facility]. Writer explained admission process however patient refused to sign required documentation other than what was locked in her locker. Patient's controlled medications were counted with patient and were locked in locker along with patient's other belongings. Patient stated she was not going to eat, drink, take her medications, or participate. Patient stated, "I'm going to pee in the bed best case scenario and worst case scenario you will have to clean it up." Patient was irritable and angry during admission however was willing to answer questions. Information was obtained from records and patient. Patient was offered dinner multiple times from nursing staff however patient refused. Writer provided patient with hygiene products and a pitcher of water. Patient has an extensive past medical history most notably asthma, seizures, hypothyroidism, depression and bipolar disorder. See physician note for complete past medical history. Patient was oriented to the unit and Q15 minute safety checks were initiated and are maintained.

## 2015-06-13 NOTE — BH Assessment (Signed)
Seeking inpt placement. Sent referrals to: Marykay Lex, [employer], [place], [place] Triage Specialist 06/13/2015 2:05 AM

## 2015-06-13 NOTE — Consult Note (Signed)
[facility] Face-to-Face Psychiatry Consult   Reason for Consult:   Major depressive disorder, recurrent severe without Psychosis, Suicide attempt by OD Referring Physician:  EDP Patient Identification: LATALIA ETZLER MRN:  [id number] Principal Diagnosis: Major depressive disorder, recurrent, severe without psychotic features Diagnosis:   Patient Active Problem List   Diagnosis Date Noted  . Major depressive disorder, recurrent, severe without psychotic features [F33.2] 06/13/2015    Priority: High  . CAP (community acquired pneumonia) [J18.9] 07/02/2014  . Cough [R05] 07/02/2014  . Nicotine dependence [F17.200] 10/06/2013  . Chronic interstitial cystitis [N30.10] 10/06/2013  . SMOKER [Z72.0] 03/04/2010  . ASTHMA [J45.909] 03/04/2010  . 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 PSORIASIS [L40.8] 03/04/2010  . LUPUS [M32.9] 03/04/2010    Total Time spent with patient: 45 minutes  Subjective:   DARSI TIEN is a 44 y.o. female patient admitted with Major depressive disorder, recurrent severe without Psychosis.  HPI:  Caucasian female, 44 years old was evaluated today after ingesting 20 tablets of 1 mg Klonopin as a suicide attempt.  Patient reports her stressors as flash backs from watching her sister die in hospice in November last year and her Girl friend leaving her yesterday.  Patient has long standing diagnosis of depression and takes medications and does have counseling.  Patient admitted to two previous suicide attempts at age 42 and 69 by OD.  Patient stated that she wanted to kill herself because she was tired of dealing with emotional pain.  Patient denied SI/HI/AVH today.  Mother was at the bed side during the interview.   Based on the amount of pills taken and the class of pill taken coupled with hx of previous attempts we have accepted patient for admission for safety and stabilization.  She is also still grieving with the loss of her sister and  recent loss of her girl Friend we are admitting patient.  We will resume her medications at the unit on arrival.  HPI Elements:   Location:  Major depressiver disorder, recurrent severe without Psychosis, Suicidal attempt by OD. Quality:  SEVERE. Severity:  severe. Timing:  Acute. Duration:  Sudden. Context:  Brought in by EMS after an OD on Klonopin .  Past Medical History:  Past Medical History  Diagnosis Date  . Interstitial cystitis   . Bipolar 1 disorder   . Acid reflux   . Hypothyroidism   . Hypothyroid   . Psoriasis   . Asthma   . Pneumonia   . Anxiety   . Depression   . Seizures     2000, medication related from psychiatry, meds adjusted, none sense then  . DDD (degenerative disc disease)     knees and back (DDD)    Past Surgical History  Procedure Laterality Date  . Total knee arthroplasty      x15  . Bladder pacemaker      x6  . Wisdom tooth extraction    . Dilatation & curettage/hysteroscopy with myosure N/A 12/26/2014    Procedure: DILATATION & CURETTAGE/HYSTEROSCOPY WITH MYOSURE;  Surgeon: Crawford Givens, MD;  Location: [facility];  Service: Gynecology;  Laterality: N/A;  . Dilitation & currettage/hystroscopy with hydrothermal ablation N/A 12/26/2014    Procedure: DILATATION & CURETTAGE/HYSTEROSCOPY WITH HYDROTHERMAL ABLATION;  Surgeon: Crawford Givens, MD;  Location: [facility];  Service: Gynecology;  Laterality: N/A;   Family History:  Family History  Problem Relation Age of Onset  . Dementia Mother   . Diabetes type II Sister   .  Alcoholism Sister    Social History:  History  Alcohol Use No     History  Drug Use No    History   Social History  . Marital Status: Single    Spouse Name: N/A  . Number of Children: N/A  . Years of Education: N/A   Social History Main Topics  . Smoking status: Current Every Day Smoker -- 1.00 packs/day for 29 years    Types: Cigarettes  . Smokeless tobacco: Never Used  . Alcohol Use: No  . Drug Use: No  . Sexual  Activity: Yes    Birth Control/ Protection: None, Other-see comments   Other Topics Concern  . None   Social History Narrative   Additional Social History:    Pain Medications: see med list Prescriptions: see med list Over the Counter: see med list History of alcohol / drug use?: No history of alcohol / drug abuse Longest period of sobriety (when/how long):  (na) Negative Consequences of Use:  (na) Withdrawal Symptoms:  (na)                     Allergies:   Allergies  Allergen Reactions  . Diphenhydramine Hives and Itching  . Silicone Itching and Rash  . Zyrtec [Cetirizine Hcl] Shortness Of Breath    Throat swelling  . Allegra [Fexofenadine] Other (See Comments) and Swelling    Facial and head swelling.  . Amoxicillin Rash and Hives  . Claritin [Loratadine] Swelling    Facial swelling  . Clindamycin/Lincomycin Hives and Rash  . Benadryl [Diphenhydramine Hcl] Hives  . Chantix [Varenicline]     Agitated, anxious  . Lamictal [Lamotrigine] Rash    Labs:  Results for orders placed or performed during the hospital encounter of 06/12/15 (from the past 48 hour(s))  CBC     Status: Abnormal   Collection Time: 06/12/15  1:51 PM  Result Value Ref Range   WBC 10.0 4.0 - 10.5 K/uL   RBC 5.12 (H) 3.87 - 5.11 MIL/uL   Hemoglobin 13.6 12.0 - 15.0 g/dL   HCT 41.7 36.0 - 46.0 %   MCV 81.4 78.0 - 100.0 fL   MCH 26.6 26.0 - 34.0 pg   MCHC 32.6 30.0 - 36.0 g/dL   RDW 13.6 11.5 - 15.5 %   Platelets 259 150 - 400 K/uL  Comprehensive metabolic panel     Status: Abnormal   Collection Time: 06/12/15  1:51 PM  Result Value Ref Range   Sodium 138 135 - 145 mmol/L   Potassium 3.9 3.5 - 5.1 mmol/L   Chloride 105 101 - 111 mmol/L   CO2 25 22 - 32 mmol/L   Glucose, Bld 117 (H) 65 - 99 mg/dL   BUN 9 6 - 20 mg/dL   Creatinine, Ser 0.68 0.44 - 1.00 mg/dL   Calcium 9.6 8.9 - 10.3 mg/dL   Total Protein 7.6 6.5 - 8.1 g/dL   Albumin 4.1 3.5 - 5.0 g/dL   AST 38 15 - 41 U/L   ALT 41  14 - 54 U/L   Alkaline Phosphatase 134 (H) 38 - 126 U/L   Total Bilirubin 0.3 0.3 - 1.2 mg/dL   GFR calc non Af Amer >60 >60 mL/min   GFR calc Af Amer >60 >60 mL/min    Comment: (NOTE) The eGFR has been calculated using the CKD EPI equation. This calculation has not been validated in all clinical situations. eGFR's persistently <60 mL/min signify possible Chronic Kidney Disease.  Anion gap 8 5 - 15  Ethanol     Status: None   Collection Time: 06/12/15  1:51 PM  Result Value Ref Range   Alcohol, Ethyl (B) <5 <5 mg/dL    Comment:        LOWEST DETECTABLE LIMIT FOR SERUM ALCOHOL IS 5 mg/dL FOR MEDICAL PURPOSES ONLY   Acetaminophen level     Status: Abnormal   Collection Time: 06/12/15  1:51 PM  Result Value Ref Range   Acetaminophen (Tylenol), Serum <10 (L) 10 - 30 ug/mL    Comment:        THERAPEUTIC CONCENTRATIONS VARY SIGNIFICANTLY. A RANGE OF 10-30 ug/mL MAY BE AN EFFECTIVE CONCENTRATION FOR MANY PATIENTS. HOWEVER, SOME ARE BEST TREATED AT CONCENTRATIONS OUTSIDE THIS RANGE. ACETAMINOPHEN CONCENTRATIONS >150 ug/mL AT 4 HOURS AFTER INGESTION AND >50 ug/mL AT 12 HOURS AFTER INGESTION ARE OFTEN ASSOCIATED WITH TOXIC REACTIONS.   Salicylate level     Status: None   Collection Time: 06/12/15  1:51 PM  Result Value Ref Range   Salicylate Lvl <7.1 2.8 - 30.0 mg/dL  Urine rapid drug screen (hosp performed)     Status: None   Collection Time: 06/12/15  2:18 PM  Result Value Ref Range   Opiates NONE DETECTED NONE DETECTED   Cocaine NONE DETECTED NONE DETECTED   Benzodiazepines NONE DETECTED NONE DETECTED   Amphetamines NONE DETECTED NONE DETECTED   Tetrahydrocannabinol NONE DETECTED NONE DETECTED   Barbiturates NONE DETECTED NONE DETECTED    Comment:        DRUG SCREEN FOR MEDICAL PURPOSES ONLY.  IF CONFIRMATION IS NEEDED FOR ANY PURPOSE, NOTIFY LAB WITHIN 5 DAYS.        LOWEST DETECTABLE LIMITS FOR URINE DRUG SCREEN Drug Class       Cutoff (ng/mL) Amphetamine       1000 Barbiturate      200 Benzodiazepine   696 Tricyclics       789 Opiates          300 Cocaine          300 THC              50     Vitals: Blood pressure 137/74, pulse 81, temperature 98.4 F (36.9 C), temperature source Oral, resp. rate 18, SpO2 98 %.  Risk to Self: Suicidal Ideation: Yes-Currently Present Suicidal Intent: Yes-Currently Present Is patient at risk for suicide?: Yes Suicidal Plan?: Yes-Currently Present Specify Current Suicidal Plan: pt took an overdose of Klonopin  Access to Means: Yes Specify Access to Suicidal Means: had access to pills What has been your use of drugs/alcohol within the last 12 months?: na-pt denies How many times?: 2 Other Self Harm Risks: na-pt denies Triggers for Past Attempts: Other (Comment) (depression) Intentional Self Injurious Behavior: None Risk to Others: Homicidal Ideation: No Thoughts of Harm to Others: No Current Homicidal Intent: No Current Homicidal Plan: No Access to Homicidal Means: No Identified Victim: na-pt denies History of harm to others?: No Assessment of Violence: None Noted Violent Behavior Description: na-pt cooperative Does patient have access to weapons?: No Criminal Charges Pending?: No Does patient have a court date: No Prior Inpatient Therapy: Prior Inpatient Therapy: Yes Prior Therapy Dates: 1999 Prior Therapy Facilty/Provider(s): unknown facility Reason for Treatment: pt is unsure Prior Outpatient Therapy: Prior Outpatient Therapy: Yes Prior Therapy Dates: Current Prior Therapy Facilty/Provider(s): Dr. Lattie Haw Pollus, Donalda Ewings Reason for Treatment: med mgnt, therapy Does patient have an ACCT team?: No Does patient have Intensive  In-House Services?  : No Does patient have [facility] services? : No Does patient have P4CC services?: No  Current Facility-Administered Medications  Medication Dose Route Frequency Provider Last Rate Last Dose  . asenapine (SAPHRIS) sublingual tablet 5 mg  5 mg  Sublingual Daily Davonna Belling, MD   5 mg at 06/13/15 9629  . desmopressin (DDAVP) tablet 0.4 mg  0.4 mg Oral QHS Davonna Belling, MD   0.4 mg at 06/12/15 2131  . fentaNYL (DURAGESIC - dosed mcg/hr) patch 25 mcg  25 mcg Transdermal Q72H Davonna Belling, MD   25 mcg at 06/12/15 1909  . gabapentin (NEURONTIN) capsule 300 mg  300 mg Oral 2 times per day Davonna Belling, MD   300 mg at 06/13/15 1242  . gabapentin (NEURONTIN) capsule 900 mg  900 mg Oral QHS Davonna Belling, MD   900 mg at 06/12/15 2202  . Levomilnacipran HCl ER CP24 80 mg  80 mg Oral Daily Davonna Belling, MD   80 mg at 06/13/15 0909  . levothyroxine (SYNTHROID, LEVOTHROID) tablet 50 mcg  50 mcg Oral QAC breakfast Davonna Belling, MD   50 mcg at 06/13/15 [phone]  . LORazepam (ATIVAN) tablet 1 mg  1 mg Oral Q6H PRN Gerardo Caiazzo   1 mg at 06/13/15 1243  . pantoprazole (PROTONIX) EC tablet 40 mg  40 mg Oral Daily Davonna Belling, MD   40 mg at 06/13/15 0831  . tiZANidine (ZANAFLEX) tablet 4 mg  4 mg Oral TID PRN Davonna Belling, MD   4 mg at 06/12/15 2145  . traZODone (DESYREL) tablet 50 mg  50 mg Oral QHS PRN Veer Elamin       Current Outpatient Prescriptions  Medication Sig Dispense Refill  . albuterol (PROVENTIL HFA;VENTOLIN HFA) 108 (90 BASE) MCG/ACT inhaler Inhale 2 puffs into the lungs every 4 (four) hours as needed for wheezing or shortness of breath. 1 Inhaler 0  . albuterol-ipratropium (COMBIVENT) 18-103 MCG/ACT inhaler Inhale 1 puff into the lungs 4 (four) times daily as needed for wheezing or shortness of breath.     Marland Kitchen asenapine (SAPHRIS) 5 MG SUBL 24 hr tablet Place 5 mg under the tongue daily.    . clonazePAM (KLONOPIN) 1 MG tablet Take 1 mg by mouth 3 (three) times daily.    Marland Kitchen desmopressin (DDAVP) 0.2 MG tablet Take 0.4 mg by mouth at bedtime.     . diclofenac sodium (VOLTAREN) 1 % GEL Apply 1 application topically once a week.    . esomeprazole (NEXIUM) 40 MG capsule Take 40 mg by mouth daily before  breakfast.    . fentaNYL (DURAGESIC - DOSED MCG/HR) 25 MCG/HR patch Place 25 mcg onto the skin every 3 (three) days.    Marland Kitchen FETZIMA 80 MG CP24 Take 80 mg by mouth daily.    Marland Kitchen gabapentin (NEURONTIN) 300 MG capsule Take 300-900 mg by mouth 3 (three) times daily. Patient takes one capsule in the morning, 1 capsule in the afternoon, and 3 capsules at night    . levothyroxine (SYNTHROID, LEVOTHROID) 50 MCG tablet Take 50 mcg by mouth daily.    . meloxicam (MOBIC) 15 MG tablet Take 15 mg by mouth daily.     Marland Kitchen morphine (MS CONTIN) 30 MG 12 hr tablet Take 30 mg by mouth every 12 (twelve) hours.     Marland Kitchen oxybutynin (DITROPAN XL) 15 MG 24 hr tablet Take 15 mg by mouth daily.    . OXYBUTYNIN TD Place 15 mg onto the skin daily.    Marland Kitchen  oxyCODONE-acetaminophen (PERCOCET) 10-325 MG per tablet Take 10-325 mg by mouth every 8 (eight) hours as needed for pain (Pain).     Marland Kitchen tiZANidine (ZANAFLEX) 4 MG tablet Take 4 mg by mouth 3 (three) times daily as needed for muscle spasms.     Marland Kitchen zolpidem (AMBIEN) 10 MG tablet Take 10 mg by mouth at bedtime as needed for sleep.    Marland Kitchen asenapine (SAPHRIS) 5 MG SUBL 24 hr tablet Place 5 mg under the tongue 2 (two) times daily.    Marland Kitchen doxycycline (VIBRAMYCIN) 50 MG capsule Take one tablet by mouth twice a day for 7 days (Patient not taking: Reported on 06/12/2015) 14 capsule 0  . fenofibrate micronized (LOFIBRA) 134 MG capsule Take 134 mg by mouth daily.    Marland Kitchen gabapentin (NEURONTIN) 100 MG capsule Take 100-200 mg by mouth See admin instructions. Take 1 capsule in the morning, and 3 capsules at bedtime.    Marland Kitchen HYDROcodone-acetaminophen (NORCO/VICODIN) 5-325 MG per tablet Take 1 tablet by mouth every 6 (six) hours as needed. (Patient not taking: Reported on 06/12/2015) 30 tablet 0    Musculoskeletal: Strength & Muscle Tone: SITTING IN BED Gait & Station: SITTING IN BED Patient leans: SEEN SITTING IN BED  Psychiatric Specialty Exam: Physical Exam  Review of Systems  Unable to perform ROS:  mental acuity    Blood pressure 137/74, pulse 81, temperature 98.4 F (36.9 C), temperature source Oral, resp. rate 18, SpO2 98 %.There is no weight on file to calculate BMI.  General Appearance: Casual  Eye Contact::  Minimal  Speech:  Clear and Coherent and Normal Rate  Volume:  Normal  Mood:  Angry, Anxious, Depressed and Irritable  Affect:  Congruent and Depressed  Thought Process:  Coherent, Goal Directed and Intact  Orientation:  Full (Time, Place, and Person)  Thought Content:  WDL  Suicidal Thoughts:  No  Homicidal Thoughts:  No  Memory:  Immediate;   Good Recent;   Good Remote;   Good  Judgement:  Poor  Insight:  Shallow  Psychomotor Activity:  Psychomotor Retardation  Concentration:  Good  Recall:  NA  Fund of Knowledge:Fair  Language: Good  Akathisia:  NA  Handed:  Right  AIMS (if indicated):     Assets:  Desire for Improvement  ADL's:  Intact  Cognition: WNL  Sleep:      Medical Decision Making: Review of Psycho-Social Stressors (1)  Treatment Plan Summary: Daily contact with patient to assess and evaluate symptoms and progress in treatment and Medication management  Plan:  Recommend psychiatric Inpatient admission when medically cleared. RESUME ALL HOME MEDICATIONS. Disposition: Admit and transfer to assigned room  Delfin Gant   PMHNP-BC 06/13/2015 4:12 PM Patient seen face-to-face for psychiatric evaluation, chart reviewed and case discussed with the physician extender and developed treatment plan. Reviewed the information documented and agree with the treatment plan. Corena Pilgrim, MD

## 2015-06-14 ENCOUNTER — Encounter (HOSPITAL_COMMUNITY): Payer: Self-pay | Admitting: Registered Nurse

## 2015-06-14 DIAGNOSIS — F314 Bipolar disorder, current episode depressed, severe, without psychotic features: Secondary | ICD-10-CM | POA: Diagnosis present

## 2015-06-14 MED ORDER — MELOXICAM 15 MG PO TABS
15.0000 mg | ORAL_TABLET | Freq: Every day | ORAL | Status: DC
  Administered 2015-06-14 – 2015-06-15 (×2): 15 mg via ORAL
  Filled 2015-06-14 (×3): qty 1
  Filled 2015-06-14: qty 2

## 2015-06-14 MED ORDER — GABAPENTIN 300 MG PO CAPS
300.0000 mg | ORAL_CAPSULE | Freq: Every day | ORAL | Status: DC
  Filled 2015-06-14: qty 1

## 2015-06-14 MED ORDER — OXYCODONE-ACETAMINOPHEN 5-325 MG PO TABS
1.5000 | ORAL_TABLET | Freq: Every day | ORAL | Status: AC | PRN
  Administered 2015-06-14: 1.5 via ORAL
  Filled 2015-06-14: qty 2

## 2015-06-14 MED ORDER — GABAPENTIN 300 MG PO CAPS: 300.0000 mg | ORAL_CAPSULE | Freq: Every day | ORAL | Status: DC

## 2015-06-14 MED ORDER — GABAPENTIN 300 MG PO CAPS
300.0000 mg | ORAL_CAPSULE | Freq: Every day | ORAL | Status: DC
  Administered 2015-06-15: 300 mg via ORAL
  Filled 2015-06-14 (×2): qty 1

## 2015-06-14 MED ORDER — GABAPENTIN 300 MG PO CAPS
600.0000 mg | ORAL_CAPSULE | Freq: Every day | ORAL | Status: DC
  Administered 2015-06-14: 600 mg via ORAL
  Filled 2015-06-14 (×2): qty 2

## 2015-06-14 NOTE — Progress Notes (Signed)
Patient ID: [name], female   DOB: 05/24/1971, 44 y.o.   MRN: [id number] PER STATE REGULATIONS 482.30  THIS CHART WAS REVIEWED FOR MEDICAL NECESSITY WITH RESPECT TO THE PATIENT'S ADMISSION/DURATION OF STAY.  NEXT REVIEW DATE: 06/17/15  Roma Schanz, RN, BSN CASE MANAGER
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
## 2015-06-14 NOTE — B[facility] Group Notes (Signed)
[facility] LCSW Group Therapy  06/14/2015 6:10 PM  Type of Therapy:  Group Therapy  Participation Level:  Active  Participation Quality:  Appropriate  Affect:  Depressed  Cognitive:  Alert  Insight:  Developing/Improving  Engagement in Therapy:  Engaged  Modes of Intervention:  Discussion, Exploration and Support  Summary of Progress/Problems: The topic for group was balance in life.  Pt participated in the discussion about when their life was in balance and out of balance and how this feels.  Pt discussed ways to get back in balance and short term goals they can work on to get where they want to be.Pt discussed trigger for her hospitalization as being stress of dealing w housebreaking partner's new puppy and resultant arguments over responsibilities and roles, leading to break up of relationship.  Received support and challenge from peers regarding taking responsibility for others and lack of self care.  Beverely Pace 06/14/2015, 6:10 PM

## 2015-06-14 NOTE — H&P (Signed)
Psychiatric Admission Assessment Adult  Patient Identification: [name] MRN:  [id number] Date of Evaluation:  06/14/2015 Chief Complaint:  Bipolar Principal Diagnosis: Bipolar 1 disorder, depressed, severe Diagnosis:   Patient Active Problem List   Diagnosis Date Noted  . Bipolar 1 disorder, depressed, severe [F31.4] 06/14/2015  . Major depressive disorder, recurrent, severe without psychotic features [F33.2] 06/13/2015  . Suicide attempt [T14.91]   . CAP (community acquired pneumonia) [J18.9] 07/02/2014  . Cough [R05] 07/02/2014  . Nicotine dependence [F17.200] 10/06/2013  . Chronic interstitial cystitis [N30.10] 10/06/2013  . SMOKER [Z72.0] 03/04/2010  . ASTHMA [J45.909] 03/04/2010  . 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 PSORIASIS [L40.8] 03/04/2010  . LUPUS [M32.9] 03/04/2010   History of Present Illness:: Patient states that she has been dealing with depression since the death of her sister.  States that she outpatient services therapist/psychiatrist/hospice. Patient states that she took an overdose of klonopin after an break up with her partner.  States that  After her sisters death she was depressed and was started on Saphris and there her mood was more down and then she was started on Fetzima which made her feel better.  Recently she and her partner brought a puppy which took time to care for having to take it to "poop and pee"  Feeling over whelmed she just left the dog not taking it out; her partner left and took the dog.  States that she does not feel bad about the relationship ending.  States that she does not want to die; "I promised my sister that I would be there for her daughters.  They have already lost a brother to early and their mother.  I don't want to die." Patient does have a history of suicide attempt when she was 44 yrs old.  Patient denies homicidal ideation, paranoia, guns in home, and violent behavior.  Patient states  that she also has a history of auditory hallucinations but that was a long time ago" denies at this time.  Elements:  Location:  Suicide attempt. Quality:  Worsening depression. Severity:  Severe. Duration:  several days. Associated Signs/Symptoms: Depression Symptoms:  depressed mood, insomnia, impaired memory, suicidal thoughts with specific plan, anxiety, panic attacks, loss of energy/fatigue, (Hypo) Manic Symptoms:  Impulsivity, Irritable Mood, Anxiety Symptoms:  Excessive Worry, Psychotic Symptoms:  Denies PTSD Symptoms: Denies Total Time spent with patient: 1 hour  Past Medical History:  Past Medical History  Diagnosis Date  . Interstitial cystitis   . Bipolar 1 disorder   . Acid reflux   . Hypothyroidism   . Hypothyroid   . Psoriasis   . Asthma   . Pneumonia   . Anxiety   . Depression   . Seizures     2000, medication related from psychiatry, meds adjusted, none sense then  . DDD (degenerative disc disease)     knees and back (DDD)    Past Surgical History  Procedure Laterality Date  . Total knee arthroplasty      x15  . Bladder pacemaker      x6  . Wisdom tooth extraction    . Dilatation & curettage/hysteroscopy with myosure N/A 12/26/2014    Procedure: DILATATION & CURETTAGE/HYSTEROSCOPY WITH MYOSURE;  Surgeon: Crawford Givens, MD;  Location: [facility];  Service: Gynecology;  Laterality: N/A;  . Dilitation & currettage/hystroscopy with hydrothermal ablation N/A 12/26/2014    Procedure: DILATATION & CURETTAGE/HYSTEROSCOPY WITH HYDROTHERMAL ABLATION;  Surgeon: Crawford Givens, MD;  Location: [facility];  Service:  Gynecology;  Laterality: N/A;   Family History:  Family History  Problem Relation Age of Onset  . Dementia Mother   . Diabetes type II Sister   . Alcoholism Sister    Social History:  History  Alcohol Use No     History  Drug Use No    History   Social History  . Marital Status: Single    Spouse Name: N/A  . Number of Children: N/A  . Years  of Education: N/A   Social History Main Topics  . Smoking status: Current Every Day Smoker -- 1.00 packs/day for 29 years    Types: Cigarettes  . Smokeless tobacco: Never Used  . Alcohol Use: No  . Drug Use: No  . Sexual Activity: Yes    Birth Control/ Protection: None, Other-see comments   Other Topics Concern  . None   Social History Narrative   Additional Social History:   Musculoskeletal: Strength & Muscle Tone: within normal limits Gait & Station: normal Patient leans: N/A  Psychiatric Specialty Exam: Physical Exam  Constitutional: She is oriented to person, place, and time.  Neck: Normal range of motion.  Respiratory: Effort normal.  Musculoskeletal: Normal range of motion.  Neurological: She is alert and oriented to person, place, and time.  Psychiatric: Her speech is normal and behavior is normal. Thought content normal. Her mood appears anxious. Cognition and memory are normal. She exhibits a depressed mood.  Patient took an over dose of Klonopin but denies suicidal ideation at this time    Review of Systems  Respiratory: Positive for cough ("I have smokers cough").        History asthma  Musculoskeletal: Positive for joint pain (Knee pain bilat.(total knee replacement bilat)).  Neurological: Headaches: on/off.  Psychiatric/Behavioral: Positive for depression. Negative for hallucinations, memory loss and substance abuse. Suicidal ideas: Denies at this time. The patient is nervous/anxious and has insomnia.   All other systems reviewed and are negative.   Blood pressure 100/61, pulse 105, temperature 98.7 F (37.1 C), temperature source Oral, resp. rate 20, height 4' 10"  (1.473 m), weight 64.411 kg (142 lb).Body mass index is 29.69 kg/(m^2).  General Appearance: Casual  Eye Contact::  Good  Speech:  Clear and Coherent and Normal Rate  Volume:  Normal  Mood:  Anxious and Depressed  Affect:  Congruent  Thought Process:  Circumstantial and Goal Directed   Orientation:  Full (Time, Place, and Person)  Thought Content:  Denies hallucinations, delusions, and paranoia  Suicidal Thoughts:  Suicide attempt  Homicidal Thoughts:  No  Memory:  Immediate;   Good Recent;   Good Remote;   Good  Judgement:  Fair  Insight:  Present  Psychomotor Activity:  Normal  Concentration:  Fair  Recall:  Good  Fund of Knowledge:Good  Language: Good  Akathisia:  No  Handed:  Right  AIMS (if indicated):     Assets:  Communication Skills Desire for Improvement Housing Physical Health Social Support  ADL's:  Intact  Cognition: WNL  Sleep:  Number of Hours: 6.75   Risk to Self: Is patient at risk for suicide?: Yes What has been your use of drugs/alcohol within the last 12 months?: none Risk to Others:   Prior Inpatient Therapy:   Prior Outpatient Therapy:    Alcohol Screening: 1. How often do you have a drink containing alcohol?: Never 9. Have you or someone else been injured as a result of your drinking?: No 10. Has a relative or friend or a  doctor or another health worker been concerned about your drinking or suggested you cut down?: No Alcohol Use Disorder Identification Test Final Score (AUDIT): 0 Brief Intervention: AUDIT score less than 7 or less-screening does not suggest unhealthy drinking-brief intervention not indicated  Allergies:   Allergies  Allergen Reactions  . Diphenhydramine Hives and Itching  . Silicone Itching and Rash  . Zyrtec [Cetirizine Hcl] Shortness Of Breath    Throat swelling  . Allegra [Fexofenadine] Other (See Comments) and Swelling    Facial and head swelling.  . Amoxicillin Rash and Hives  . Claritin [Loratadine] Swelling    Facial swelling  . Clindamycin/Lincomycin Hives and Rash  . Benadryl [Diphenhydramine Hcl] Hives  . Chantix [Varenicline]     Agitated, anxious  . Lamictal [Lamotrigine] Rash   Lab Results: No results found for this or any previous visit (from the past 33 hour(s)). Current  Medications: Current Facility-Administered Medications  Medication Dose Route Frequency Provider Last Rate Last Dose  . acetaminophen (TYLENOL) tablet 650 mg  650 mg Oral Q6H PRN Kerrie Buffalo, NP      . alum & mag hydroxide-simeth (MAALOX/MYLANTA) 200-200-20 MG/5ML suspension 30 mL  30 mL Oral Q4H PRN Kerrie Buffalo, NP      . asenapine (SAPHRIS) sublingual tablet 5 mg  5 mg Sublingual Daily Delfin Gant, NP   5 mg at 06/14/15 0753  . desmopressin (DDAVP) tablet 0.4 mg  0.4 mg Oral QHS Delfin Gant, NP   0.4 mg at 06/13/15 2109  . [START ON 06/15/2015] fentaNYL (DURAGESIC - dosed mcg/hr) patch 25 mcg  25 mcg Transdermal Q72H Delfin Gant, NP      . Derrill Memo ON 06/15/2015] gabapentin (NEURONTIN) capsule 300 mg  300 mg Oral Daily Shuvon B Rankin, NP      . gabapentin (NEURONTIN) capsule 300 mg  300 mg Oral QHS Shuvon B Rankin, NP      . gabapentin (NEURONTIN) capsule 600 mg  600 mg Oral QHS Shuvon B Rankin, NP      . Levomilnacipran HCl ER CP24 80 mg  80 mg Oral Daily Delfin Gant, NP   80 mg at 06/14/15 0800  . levothyroxine (SYNTHROID, LEVOTHROID) tablet 50 mcg  50 mcg Oral QAC breakfast Delfin Gant, NP   50 mcg at 06/14/15 0643  . LORazepam (ATIVAN) tablet 1 mg  1 mg Oral Q6H PRN Delfin Gant, NP   1 mg at 06/14/15 1204  . magnesium hydroxide (MILK OF MAGNESIA) suspension 30 mL  30 mL Oral Daily PRN Kerrie Buffalo, NP      . meloxicam [facility]) tablet 15 mg  15 mg Oral Daily Shuvon B Rankin, NP      . nicotine (NICODERM CQ - dosed in mg/24 hours) patch 21 mg  21 mg Transdermal Daily Kerrie Buffalo, NP   21 mg at 06/14/15 0645  . oxybutynin (DITROPAN XL) 24 hr tablet 15 mg  15 mg Oral QHS Laverle Hobby, PA-C   15 mg at 06/13/15 2234  . oxyCODONE-acetaminophen (PERCOCET/ROXICET) 5-325 MG per tablet 1.5 tablet  1.5 tablet Oral Daily PRN Shuvon B Rankin, NP      . pantoprazole (PROTONIX) EC tablet 40 mg  40 mg Oral Daily Delfin Gant, NP   40 mg at 06/14/15  0752  . tiZANidine (ZANAFLEX) tablet 4 mg  4 mg Oral TID PRN Delfin Gant, NP      . traZODone (DESYREL) tablet 50 mg  50 mg Oral  QHS,MR X 1 Laverle Hobby, PA-C   50 mg at 06/13/15 2213   PTA Medications: Prescriptions prior to admission  Medication Sig Dispense Refill Last Dose  . albuterol (PROVENTIL HFA;VENTOLIN HFA) 108 (90 BASE) MCG/ACT inhaler Inhale 2 puffs into the lungs every 4 (four) hours as needed for wheezing or shortness of breath. 1 Inhaler 0 06/11/2015 at Unknown time  . albuterol-ipratropium (COMBIVENT) 18-103 MCG/ACT inhaler Inhale 1 puff into the lungs 4 (four) times daily as needed for wheezing or shortness of breath.    06/11/2015 at Unknown time  . asenapine (SAPHRIS) 5 MG SUBL 24 hr tablet Place 5 mg under the tongue 2 (two) times daily.     Marland Kitchen asenapine (SAPHRIS) 5 MG SUBL 24 hr tablet Place 5 mg under the tongue daily.   06/11/2015 at Unknown time  . desmopressin (DDAVP) 0.2 MG tablet Take 0.4 mg by mouth at bedtime.    06/11/2015 at Unknown time  . diclofenac sodium (VOLTAREN) 1 % GEL Apply 1 application topically once a week.   Past Week at Unknown time  . esomeprazole (NEXIUM) 40 MG capsule Take 40 mg by mouth daily before breakfast.   06/11/2015 at Unknown time  . fenofibrate micronized (LOFIBRA) 134 MG capsule Take 134 mg by mouth daily.   Not Taking at Unknown time  . fentaNYL (DURAGESIC - DOSED MCG/HR) 25 MCG/HR patch Place 25 mcg onto the skin every 3 (three) days.   Past Week at Unknown time  . FETZIMA 80 MG CP24 Take 80 mg by mouth daily.   06/12/2015 at Unknown time  . gabapentin (NEURONTIN) 100 MG capsule Take 100-200 mg by mouth See admin instructions. Take 1 capsule in the morning, and 3 capsules at bedtime.   Not Taking at Unknown time  . gabapentin (NEURONTIN) 300 MG capsule Take 300-900 mg by mouth 3 (three) times daily. Patient takes one capsule in the morning, 1 capsule in the afternoon, and 3 capsules at night   06/11/2015 at Unknown time  .  levothyroxine (SYNTHROID, LEVOTHROID) 50 MCG tablet Take 50 mcg by mouth daily.   06/12/2015 at Unknown time  . meloxicam (MOBIC) 15 MG tablet Take 15 mg by mouth daily.    06/12/2015 at Unknown time  . oxybutynin (DITROPAN XL) 15 MG 24 hr tablet Take 15 mg by mouth daily.   Past Week at Unknown time  . OXYBUTYNIN TD Place 15 mg onto the skin daily.   Past Month at Unknown time  . oxyCODONE-acetaminophen (PERCOCET) 10-325 MG per tablet Take 10-325 mg by mouth every 8 (eight) hours as needed for pain (Pain).    Past Week at Unknown time  . tiZANidine (ZANAFLEX) 4 MG tablet Take 4 mg by mouth 3 (three) times daily as needed for muscle spasms.    Past Week at Unknown time  . zolpidem (AMBIEN) 10 MG tablet Take 10 mg by mouth at bedtime as needed for sleep.   06/11/2015 at Unknown time    Previous Psychotropic Medications: Yes   Substance Abuse History in the last 12 months:  No.    Consequences of Substance Abuse: Denies  Results for orders placed or performed during the hospital encounter of 06/12/15 (from the past 72 hour(s))  CBC     Status: Abnormal   Collection Time: 06/12/15  1:51 PM  Result Value Ref Range   WBC 10.0 4.0 - 10.5 K/uL   RBC 5.12 (H) 3.87 - 5.11 MIL/uL   Hemoglobin 13.6 12.0 - 15.0  g/dL   HCT 41.7 36.0 - 46.0 %   MCV 81.4 78.0 - 100.0 fL   MCH 26.6 26.0 - 34.0 pg   MCHC 32.6 30.0 - 36.0 g/dL   RDW 13.6 11.5 - 15.5 %   Platelets 259 150 - 400 K/uL  Comprehensive metabolic panel     Status: Abnormal   Collection Time: 06/12/15  1:51 PM  Result Value Ref Range   Sodium 138 135 - 145 mmol/L   Potassium 3.9 3.5 - 5.1 mmol/L   Chloride 105 101 - 111 mmol/L   CO2 25 22 - 32 mmol/L   Glucose, Bld 117 (H) 65 - 99 mg/dL   BUN 9 6 - 20 mg/dL   Creatinine, Ser 0.68 0.44 - 1.00 mg/dL   Calcium 9.6 8.9 - 10.3 mg/dL   Total Protein 7.6 6.5 - 8.1 g/dL   Albumin 4.1 3.5 - 5.0 g/dL   AST 38 15 - 41 U/L   ALT 41 14 - 54 U/L   Alkaline Phosphatase 134 (H) 38 - 126 U/L    Total Bilirubin 0.3 0.3 - 1.2 mg/dL   GFR calc non Af Amer >60 >60 mL/min   GFR calc Af Amer >60 >60 mL/min    Comment: (NOTE) The eGFR has been calculated using the CKD EPI equation. This calculation has not been validated in all clinical situations. eGFR's persistently <60 mL/min signify possible Chronic Kidney Disease.    Anion gap 8 5 - 15  Ethanol     Status: None   Collection Time: 06/12/15  1:51 PM  Result Value Ref Range   Alcohol, Ethyl (B) <5 <5 mg/dL    Comment:        LOWEST DETECTABLE LIMIT FOR SERUM ALCOHOL IS 5 mg/dL FOR MEDICAL PURPOSES ONLY   Acetaminophen level     Status: Abnormal   Collection Time: 06/12/15  1:51 PM  Result Value Ref Range   Acetaminophen (Tylenol), Serum <10 (L) 10 - 30 ug/mL    Comment:        THERAPEUTIC CONCENTRATIONS VARY SIGNIFICANTLY. A RANGE OF 10-30 ug/mL MAY BE AN EFFECTIVE CONCENTRATION FOR MANY PATIENTS. HOWEVER, SOME ARE BEST TREATED AT CONCENTRATIONS OUTSIDE THIS RANGE. ACETAMINOPHEN CONCENTRATIONS >150 ug/mL AT 4 HOURS AFTER INGESTION AND >50 ug/mL AT 12 HOURS AFTER INGESTION ARE OFTEN ASSOCIATED WITH TOXIC REACTIONS.   Salicylate level     Status: None   Collection Time: 06/12/15  1:51 PM  Result Value Ref Range   Salicylate Lvl <3.3 2.8 - 30.0 mg/dL  Urine rapid drug screen (hosp performed)     Status: None   Collection Time: 06/12/15  2:18 PM  Result Value Ref Range   Opiates NONE DETECTED NONE DETECTED   Cocaine NONE DETECTED NONE DETECTED   Benzodiazepines NONE DETECTED NONE DETECTED   Amphetamines NONE DETECTED NONE DETECTED   Tetrahydrocannabinol NONE DETECTED NONE DETECTED   Barbiturates NONE DETECTED NONE DETECTED    Comment:        DRUG SCREEN FOR MEDICAL PURPOSES ONLY.  IF CONFIRMATION IS NEEDED FOR ANY PURPOSE, NOTIFY LAB WITHIN 5 DAYS.        LOWEST DETECTABLE LIMITS FOR URINE DRUG SCREEN Drug Class       Cutoff (ng/mL) Amphetamine      1000 Barbiturate      200 Benzodiazepine    545 Tricyclics       625 Opiates          300 Cocaine  300 THC              50     Observation Level/Precautions:  15 minute checks  Laboratory:  CBC Chemistry Profile UDS UA  Psychotherapy:  Individual and group session  Medications:  Medications will be stated that are appropriate for patient stabilization  Consultations:  Psychiatry  Discharge Concerns:  Safety, stabilization, and risk of access to medication and medication stabilization   Estimated LOS:  5-7 days  Other:     Psychological Evaluations: Yes   Treatment Plan Summary: Daily contact with patient to assess and evaluate symptoms and progress in treatment and Medication management  1. Admit for crisis management and stabilization 2. Medication management to reduce current symptoms to bale line and improve the patient's overall level of functioning:  Continue Saphris 5 mg And Fetzima  (Levomilnacipran HCI ER 24) 80 mg 3. Treat health problems as indicated 4. Develop treatment plan to decrease risk of relapse upon discharge and the need for readmission. 5. Psycho-social education regarding relapse prevention and self care. 6. Health care follow up as needed for medical problems 7. Restart home medications where appropriate: Talk with [facility] to vilification  medication for pain management  Medical Decision Making:  Review of Psycho-Social Stressors (1), Review or order clinical lab tests (1), Review and summation of old records (2), Independent Review of image, tracing or specimen (2) and Review of Medication Regimen & Side Effects (2)  I certify that inpatient services furnished can reasonably be expected to improve the patient's condition.    Earleen Newport, FNP-BC 7/14/20166:19 PM I personally assessed the patient and formulated the plan Geralyn Flash A. Sabra Heck, M.D.
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
## 2015-06-14 NOTE — Progress Notes (Signed)
Patient ID: [name], female   DOB: 05-30-1971, 44 y.o.   MRN: [id number] D: Patient tearful about her admission process. Pt reports she has a lot of losses recently that made her overdose. Pt mood and affect appeared depressed and flat. Pt denies SI/HI/AVH and pain.  Cooperative with assessment. No acute distressed noted at this time.   A: Met with pt 1:1. Medications administered as prescribed. Support and encouragement provided to attend groups and engage in milieu. Pt encouraged to discuss feelings and come to staff with any question or concerns.   R: Patient remains safe is safe and complaint with medications.

## 2015-06-14 NOTE — Progress Notes (Signed)
The patient attended this evening's Karaoke group and was appropriate.  

## 2015-06-14 NOTE — Plan of Care (Signed)
Problem: Diagnosis: Increased Risk For Suicide Attempt Goal: STG-Patient Will Comply With Medication Regime Outcome: Progressing Pt compliant with medication regime     

## 2015-06-14 NOTE — Progress Notes (Signed)
Patient ID: [name], female   DOB: 12-21-1970, 44 y.o.   MRN: [id number] D   ---  Pt Is app/coop with staff and interacts well with peers.  Pt. apologized  For her angry behavior on admission.   Pt. Said she was up-set because the ED gave her mis-information about her admission , etc.   She has complained of increased anxiety at mid-day because another pt. Entered her room. (  See MAR )_ She said she was taking a nap and woke to find a female pt. Standing at the foot of her bed staring at her.   Pt. Has chronic pain issues and has reported that her pain has been with-in her tolerable limit so no PRNs were asked for at this time.   Pt.igned all paperwork this morning that she had refused to sign on admission.   --- A --- support, safety and medications provided.  --- R --  Pt. Is safe on the unit

## 2015-06-14 NOTE — Progress Notes (Signed)
Adult Psychoeducational Group Note  Date:  06/14/2015 Time:  4:07 PM  Group Topic/Focus:   Participation Level:  Did Not Attend  Participation Quality:    Affect:    Cognitive:    Insight:   Engagement in Group:    Modes of Intervention:    Additional Comments:  Pt was asleep.   Dionne Bucy 06/14/2015, 4:07 PM

## 2015-06-14 NOTE — Progress Notes (Signed)
Patient ID: [name], female   DOB: Apr 07, 1971, 44 y.o.   MRN: [id number] D: Visible on milieu and interacting appropriately with peers. Open and spontaneous in conversation. Calm and cooperative with assessment. No acute distress noted. States she continues to feel well. Hopeful for D/C in AM and doesn't have any questions surrounding D/C. States she is missing cigarettes and is looking forward to smoking tomorrow as one of the first things she will do. Denies SI/HI/AVH and contracts for safety. Did endorse pain in lower back and bilateral knees. Otherwise offers no questions or concerns.  A:Met with pt 1:1. Medications administered as ordered by MD. PRN provided for pain. Reminded pt of the negative health effects of smoking and encouraged her to consider continuing to use the patch after d/c. Support and encouragement provided. Safety has been maintained with q15 minute checks.  R: Patient remains safe is safe and compliant with medications. Will f/u reponse to prn. Will continue current POC and q15 minutes checks for safety.

## 2015-06-15 MED ORDER — GABAPENTIN 300 MG PO CAPS: ORAL_CAPSULE | ORAL | Status: DC

## 2015-06-15 MED ORDER — LEVOMILNACIPRAN HCL ER 80 MG PO CP24: 80.0000 mg | ORAL_CAPSULE | Freq: Every day | ORAL | Status: DC

## 2015-06-15 MED ORDER — TRAZODONE HCL 50 MG PO TABS: 50.0000 mg | ORAL_TABLET | Freq: Every evening | ORAL | Status: DC | PRN

## 2015-06-15 MED ORDER — DICLOFENAC SODIUM 1 % TD GEL: 1.0000 "application " | TRANSDERMAL | Status: AC

## 2015-06-15 MED ORDER — FENTANYL 25 MCG/HR TD PT72: 25.0000 ug | MEDICATED_PATCH | TRANSDERMAL | Status: DC

## 2015-06-15 MED ORDER — LEVOTHYROXINE SODIUM 50 MCG PO TABS: 50.0000 ug | ORAL_TABLET | Freq: Every day | ORAL | Status: AC

## 2015-06-15 MED ORDER — DESMOPRESSIN ACETATE 0.2 MG PO TABS: 0.4000 mg | ORAL_TABLET | Freq: Every day | ORAL | Status: AC

## 2015-06-15 MED ORDER — OXYBUTYNIN CHLORIDE ER 15 MG PO TB24: 15.0000 mg | ORAL_TABLET | Freq: Every day | ORAL | Status: AC

## 2015-06-15 MED ORDER — FENOFIBRATE MICRONIZED 134 MG PO CAPS: 134.0000 mg | ORAL_CAPSULE | Freq: Every day | ORAL | Status: DC

## 2015-06-15 MED ORDER — NICOTINE 21 MG/24HR TD PT24: 21.0000 mg | MEDICATED_PATCH | Freq: Every day | TRANSDERMAL | Status: DC

## 2015-06-15 MED ORDER — ESOMEPRAZOLE MAGNESIUM 40 MG PO CPDR: 40.0000 mg | DELAYED_RELEASE_CAPSULE | Freq: Every day | ORAL | Status: DC

## 2015-06-15 MED ORDER — ASENAPINE MALEATE 5 MG SL SUBL: 5.0000 mg | SUBLINGUAL_TABLET | Freq: Every day | SUBLINGUAL | Status: DC

## 2015-06-15 MED ORDER — MELOXICAM 15 MG PO TABS: 15.0000 mg | ORAL_TABLET | Freq: Every day | ORAL | Status: DC

## 2015-06-15 MED ORDER — GABAPENTIN 300 MG PO CAPS: 600.0000 mg | ORAL_CAPSULE | Freq: Every day | ORAL | Status: DC

## 2015-06-15 MED ORDER — GABAPENTIN 300 MG PO CAPS
300.0000 mg | ORAL_CAPSULE | Freq: Two times a day (BID) | ORAL | Status: DC
  Filled 2015-06-15 (×2): qty 6

## 2015-06-15 NOTE — Tx Team (Signed)
Interdisciplinary Treatment Plan Update (Adult)  Date:  06/15/2015   Time Reviewed:  11:23 AM   Progress in Treatment: Attending groups: Yes. Participating in groups:  Yes. Taking medication as prescribed:  Yes. Tolerating medication:  Yes. Family/Significant othe contact made:  Yes Patient understands diagnosis:  Yes  As evidenced by seeking help with depression Discussing patient identified problems/goals with staff:  Yes, see initial care plan. Medical problems stabilized or resolved:  Yes. Denies suicidal/homicidal ideation: Yes. Issues/concerns per patient self-inventory:  No. Other:  New problem(s) identified:  Discharge Plan or Barriers: return home, follow up outpt  Reason for Continuation of Hospitalization:   Comments:  Estimated length of stay:  D/C today  New goal(s):  Review of initial/current patient goals per problem list:     Attendees: Patient:  06/15/2015 11:23 AM   Family:   06/15/2015 11:23 AM   Physician:  Carlton Adam, MD 06/15/2015 11:23 AM   Nursing:   Marcello Moores, RN 06/15/2015 11:23 AM   CSW:    Roque Lias, LCSW   06/15/2015 11:23 AM   Other:  06/15/2015 11:23 AM   Other:   06/15/2015 11:23 AM   Other:  Lars Pinks, Nurse CM 06/15/2015 11:23 AM   Other:  Lucinda Dell, [facility] 06/15/2015 11:23 AM   Other:  Norberto Sorenson, [place]  06/15/2015 11:23 AM   Other:  06/15/2015 11:23 AM   Other:  06/15/2015 11:23 AM   Other:  06/15/2015 11:23 AM   Other:  06/15/2015 11:23 AM   Other:  06/15/2015 11:23 AM   Other:   06/15/2015 11:23 AM    Scribe for Treatment Team:   Trish Mage, 06/15/2015 11:23 AM

## 2015-06-15 NOTE — BHH Suicide Risk Assessment (Signed)
[place] INPATIENT:  Family/Significant Other Suicide Prevention Education  Suicide Prevention Education:  Patient Refusal for Family/Significant Other Suicide Prevention Education: The patient [name] has refused to provide written consent for family/significant other to be provided Family/Significant Other Suicide Prevention Education during admission and/or prior to discharge.  Physician notified.  Roque Lias B 06/15/2015, 12:14 PM

## 2015-06-15 NOTE — Progress Notes (Signed)
  [facility] Adult Case Management Discharge Plan :  Will you be returning to the same living situation after discharge:  Yes,  home with mother At discharge, do you have transportation home?: Yes,  family Do you have the ability to pay for your medications: Yes,  insurance  Release of information consent forms completed and in the chart;  Patient's signature needed at discharge.  Patient to Follow up at: Follow-up Information    Follow up with [facility] On 06/25/2015.   Why:  Patient is current with therapist Celesta Gentile and Noemi Chapel.  You have an appt w Celesta Gentile on 06/25/15 at 3 PM and an appt w Noemi Chapel for medications management on Saturday July 23 at 10:15 AM   Contact information:   [address],  [place], [place] [address] Phone: [phone] Fax: [phone] Fax:        Patient denies SI/HI: Yes,  yes    Safety Planning and Suicide Prevention discussed: Yes,  yes  Have you used any form of tobacco in the last 30 days? (Cigarettes, Smokeless Tobacco, Cigars, and/or Pipes): Yes  Has patient been referred to the Quitline?: Patient refused referral  Trish Mage 06/15/2015, 11:26 AM

## 2015-06-15 NOTE — Progress Notes (Addendum)
Pt is very bright and cheerful this am. She would like very much to leave early so she can go with family to take her sisters ashes to the [employer]. Pt took all her am medications and is very cooperative and pleasant. Pt denies Si and HI and contracts for safety.pt has been in the dayroom with the other pts. 11:25a_pt was given back all her belongings and was walked out to met her mother. Pt expressed concern that her primary doctor was never made aware that she was here . Pt denied Si and HI and contracted for safety. She stated if she ever felt she wanted to hurt herself she would come right back to [facility]. Her experience here was very positive.

## 2015-06-15 NOTE — Discharge Summary (Signed)
Physician Discharge Summary Note  Patient:  [name] is an 44 y.o., female MRN:  [id number] DOB:  May 25, 1971 Patient phone:  [phone] (home)  Patient address:   [address]  [place] [address],  Total Time spent with patient: 30 minutes  Date of Admission:  06/13/2015 Date of Discharge: 06/14/14  Reason for Admission:  Mood stabilization treatments  Principal Problem: Bipolar 1 disorder, depressed, severe Discharge Diagnoses: Patient Active Problem List   Diagnosis Date Noted  . Bipolar 1 disorder, depressed, severe [F31.4] 06/14/2015  . Major depressive disorder, recurrent, severe without psychotic features [F33.2] 06/13/2015  . Suicide attempt [T14.91]   . CAP (community acquired pneumonia) [J18.9] 07/02/2014  . Cough [R05] 07/02/2014  . Nicotine dependence [F17.200] 10/06/2013  . Chronic interstitial cystitis [N30.10] 10/06/2013  . SMOKER [Z72.0] 03/04/2010  . ASTHMA [J45.909] 03/04/2010  . 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 PSORIASIS [L40.8] 03/04/2010  . LUPUS [M32.9] 03/04/2010    Musculoskeletal: Strength & Muscle Tone: within normal limits Gait & Station: normal Patient leans: N/A  Psychiatric Specialty Exam: Physical Exam  Psychiatric: She has a normal mood and affect. Her speech is normal and behavior is normal. Judgment and thought content normal. Cognition and memory are normal.    Review of Systems  Constitutional: Negative.   HENT: Negative.   Eyes: Negative.   Respiratory: Negative.   Cardiovascular: Negative.   Gastrointestinal: Negative.   Genitourinary: Negative.   Musculoskeletal: Negative.   Skin: Negative.   Neurological: Negative.   Endo/Heme/Allergies: Negative.   Psychiatric/Behavioral: Positive for depression (Stabilized with treatment ). Negative for suicidal ideas, hallucinations, memory loss and substance abuse. The patient is not nervous/anxious and does not have insomnia.      Blood pressure 95/67, pulse 87, temperature 97.8 F (36.6 C), temperature source Oral, resp. rate 16, height 4\' 10"  (1.473 m), weight 64.411 kg (142 lb).Body mass index is 29.69 kg/(m^2).  See Physician SRA     Have you used any form of tobacco in the last 30 days? (Cigarettes, Smokeless Tobacco, Cigars, and/or Pipes): Yes  Has this patient used any form of tobacco in the last 30 days? (Cigarettes, Smokeless Tobacco, Cigars, and/or Pipes) Yes, a prescription for Nicotine patch was provided at discharge to encourage continuation of smoking cessation   Past Medical History:  Past Medical History  Diagnosis Date  . Interstitial cystitis   . Bipolar 1 disorder   . Acid reflux   . Hypothyroidism   . Hypothyroid   . Psoriasis   . Asthma   . Pneumonia   . Anxiety   . Depression   . Seizures     2000, medication related from psychiatry, meds adjusted, none sense then  . DDD (degenerative disc disease)     knees and back (DDD)    Past Surgical History  Procedure Laterality Date  . Total knee arthroplasty      x15  . Bladder pacemaker      x6  . Wisdom tooth extraction    . Dilatation & curettage/hysteroscopy with myosure N/A 12/26/2014    Procedure: DILATATION & CURETTAGE/HYSTEROSCOPY WITH MYOSURE;  Surgeon: Crawford Givens, MD;  Location: [facility];  Service: Gynecology;  Laterality: N/A;  . Dilitation & currettage/hystroscopy with hydrothermal ablation N/A 12/26/2014    Procedure: DILATATION & CURETTAGE/HYSTEROSCOPY WITH HYDROTHERMAL ABLATION;  Surgeon: Crawford Givens, MD;  Location: [facility];  Service: Gynecology;  Laterality: N/A;   Family History:  Family History  Problem Relation Age of  Onset  . Dementia Mother   . Diabetes type II Sister   . Alcoholism Sister    Social History:  History  Alcohol Use No     History  Drug Use No    History   Social History  . Marital Status: Single    Spouse Name: N/A  . Number of Children: N/A  . Years of Education: N/A   Social  History Main Topics  . Smoking status: Current Every Day Smoker -- 1.00 packs/day for 29 years    Types: Cigarettes  . Smokeless tobacco: Never Used  . Alcohol Use: No  . Drug Use: No  . Sexual Activity: Yes    Birth Control/ Protection: None, Other-see comments   Other Topics Concern  . None   Social History Narrative   Risk to Self: Is patient at risk for suicide?: Yes What has been your use of drugs/alcohol within the last 12 months?: none Risk to Others:   Prior Inpatient Therapy:   Prior Outpatient Therapy:    Level of Care:  OP  Hospital Course:    Janesha Brissette is a 30 female who stated that she has been dealing with depression since the death of her sister. States that she outpatient services therapist/psychiatrist/hospice.Patient states that she took an overdose of klonopin after an break up with her partner. States that After her sisters death she was depressed and was started on Saphris and there her mood was more down and then she was started on Fetzima which made her feel better. Recently she and her partner brought a puppy which took time to care for having to take it to "poop and pee" Feeling over whelmed she just left the dog not taking it out; her partner left and took the dog. States that she does not feel bad about the relationship ending. States that she does not want to die; "I promised my sister that I would be there for her daughters. They have already lost a brother to early and their mother. I don't want to die." Patient does have a history of suicide attempt when she was 44 yrs old. Patient denies homicidal ideation, paranoia, guns in home, and violent behavior. Patient states that she also has a history of auditory hallucinations but that was a long time ago" denied upon admission.          Yazlynn A Ihnen was admitted to the adult 500 unit where she was evaluated and her symptoms were identified. Medication management was discussed and  implemented. Her Saphris was continued and Fetzima was added for treatment of depression. She was encouraged to participate in unit programming. Medical problems were identified and treated appropriately. The patient was continued on home medications to such as Synthroid, Mobic, Ditropan, and percocet. Home medication was restarted as needed.   She was evaluated each day by a clinical provider to ascertain the patient's response to treatment.  Improvement was noted by the patient's report of decreasing symptoms, improved sleep and appetite, affect, medication tolerance, behavior, and participation in unit programming.  The patient was asked each day to complete a self inventory noting mood, mental status, pain, new symptoms, anxiety and concerns.         She responded well to medication and being in a therapeutic and supportive environment. Positive and appropriate behavior was noted and the patient was motivated for recovery.  She worked closely with the treatment team and case manager to develop a discharge plan with appropriate goals. Coping skills,  problem solving as well as relaxation therapies were also part of the unit programming. Nursing notes indicated that the patient was bright and cheerful during interactions. The patient requested discharge in order to go with her family to take sister's ashes to the [facility]. Her mood was noted to be stable and the patient denied any suicidal thoughts. The patient planned to discharge home to her mother.          By the day of discharge she was in much improved condition than upon admission.  Symptoms were reported as significantly decreased or resolved completely. The patient denied SI/HI and voiced no AVH. She was motivated to continue taking medication with a goal of continued improvement in mental health.    Dhruti A Cardiff was discharged home with a plan to follow up as noted below. The patient was provided with sample medications and prescriptions at  time of discharge. She left [facility] in stable condition with all belongings returned to her.    Consults:  psychiatry  Significant Diagnostic Studies:  Chemistry profile, CBC, UDS negative, EKG  Discharge Vitals:   Blood pressure 95/67, pulse 87, temperature 97.8 F (36.6 C), temperature source Oral, resp. rate 16, height 4\' 10"  (1.473 m), weight 64.411 kg (142 lb). Body mass index is 29.69 kg/(m^2). Lab Results:   No results found for this or any previous visit (from the past 72 hour(s)).  Physical Findings: AIMS: Facial and Oral Movements Muscles of Facial Expression: None, normal Lips and Perioral Area: None, normal Jaw: None, normal Tongue: None, normal,Extremity Movements Upper (arms, wrists, hands, fingers): None, normal Lower (legs, knees, ankles, toes): None, normal, Trunk Movements Neck, shoulders, hips: None, normal, Overall Severity Severity of abnormal movements (highest score from questions above): None, normal Incapacitation due to abnormal movements: None, normal Patient's awareness of abnormal movements (rate only patient's report): No Awareness, Dental Status Current problems with teeth and/or dentures?: No Does patient usually wear dentures?: Yes  CIWA:    COWS:      See Psychiatric Specialty Exam and Suicide Risk Assessment completed by Attending Physician prior to discharge.  Discharge destination:  Home  Is patient on multiple antipsychotic therapies at discharge:  No   Has Patient had three or more failed trials of antipsychotic monotherapy by history:  No  Recommended Plan for Multiple Antipsychotic Therapies: NA      Discharge Instructions    Discharge instructions    Complete by:  As directed   Please follow up with your Primary Care Provider for further management of chronic medical problems such as for pain management as scheduled.            Medication List    STOP taking these medications        albuterol-ipratropium 18-103 MCG/ACT  inhaler  Commonly known as:  COMBIVENT     OXYBUTYNIN TD     zolpidem 10 MG tablet  Commonly known as:  AMBIEN      TAKE these medications      Indication   albuterol 108 (90 BASE) MCG/ACT inhaler  Commonly known as:  PROVENTIL HFA;VENTOLIN HFA  Inhale 2 puffs into the lungs every 4 (four) hours as needed for wheezing or shortness of breath.      asenapine 5 MG Subl 24 hr tablet  Commonly known as:  SAPHRIS  Place 1 tablet (5 mg total) under the tongue daily.   Indication:  Mood control     desmopressin 0.2 MG tablet  Commonly known as:  DDAVP  Take 2 tablets (0.4 mg total) by mouth at bedtime.   Indication:  Bedwetting     diclofenac sodium 1 % Gel  Commonly known as:  VOLTAREN  Apply 1 application topically once a week.      esomeprazole 40 MG capsule  Commonly known as:  NEXIUM  Take 1 capsule (40 mg total) by mouth daily before breakfast.   Indication:  Heartburn     fenofibrate micronized 134 MG capsule  Commonly known as:  LOFIBRA  Take 1 capsule (134 mg total) by mouth daily.   Indication:  High Amount of Triglycerides in the Blood     fentaNYL 25 MCG/HR patch  Commonly known as:  DURAGESIC - dosed mcg/hr  Place 1 patch (25 mcg total) onto the skin every 3 (three) days.   Indication:  Chronic Pain     gabapentin 300 MG capsule  Commonly known as:  NEURONTIN  Take 2 capsules (600 mg total) by mouth at bedtime.   Indication:  Nerve Pain, Insomnia     gabapentin 300 MG capsule  Commonly known as:  NEURONTIN  Take one capsule in the morning and one capsule at lunch.   Indication:  Nerve Pain, Anxiety     Levomilnacipran HCl ER 80 MG Cp24  Commonly known as:  FETZIMA  Take 80 mg by mouth daily.   Indication:  Major Depressive Disorder     levothyroxine 50 MCG tablet  Commonly known as:  SYNTHROID, LEVOTHROID  Take 1 tablet (50 mcg total) by mouth daily.   Indication:  Underactive Thyroid     meloxicam 15 MG tablet  Commonly known as:  MOBIC  Take 1  tablet (15 mg total) by mouth daily.   Indication:  Chronic pain     nicotine 21 mg/24hr patch  Commonly known as:  NICODERM CQ - dosed in mg/24 hours  Place 1 patch (21 mg total) onto the skin daily.   Indication:  Nicotine Addiction     oxybutynin 15 MG 24 hr tablet  Commonly known as:  DITROPAN XL  Take 1 tablet (15 mg total) by mouth daily.   Indication:  Bedwetting, Urinary Incontinence     PERCOCET 10-325 MG per tablet  Generic drug:  oxyCODONE-acetaminophen  Take 10-325 mg by mouth every 8 (eight) hours as needed for pain (Pain).   Indication:  Pain     traZODone 50 MG tablet  Commonly known as:  DESYREL  Take 1 tablet (50 mg total) by mouth at bedtime and may repeat dose one time if needed.   Indication:  Trouble Sleeping     ZANAFLEX 4 MG tablet  Generic drug:  tiZANidine  Take 4 mg by mouth 3 (three) times daily as needed for muscle spasms.   Indication:  Muscle Spasticity, Tension Headache       Follow-up Information    Follow up with [facility] On 06/25/2015.   Why:  Patient is current with therapist Celesta Gentile and Noemi Chapel.  You have an appt w Celesta Gentile on 06/25/15 at 3 PM and an appt w Noemi Chapel for medications management on Saturday July 23 at 10:15 AM   Contact information:   [address],  [place], [place] [address] Phone: [phone] Fax: [phone] Fax:        Follow-up recommendations:   As above   Comments:   Take all your medications as prescribed by your mental healthcare provider.  Report any adverse effects  and or reactions from your medicines to your outpatient provider promptly.  Patient is instructed and cautioned to not engage in alcohol and or illegal drug use while on prescription medicines.  In the event of worsening symptoms, patient is instructed to call the crisis hotline, 911 and or go to the nearest ED for appropriate evaluation and treatment of symptoms.  Follow-up with your primary care provider for  your other medical issues, concerns and or health care needs.   Total Discharge Time: Greater than 30 minutes  Signed: Elmarie Shiley, NP-C 06/15/2015, 5:24 PM  I personally assessed the patient and formulated the plan Geralyn Flash A. Sabra Heck, M.D.

## 2015-08-07 ENCOUNTER — Ambulatory Visit: Payer: Self-pay | Admitting: Internal Medicine

## 2015-08-13 ENCOUNTER — Ambulatory Visit: Payer: Self-pay | Admitting: Internal Medicine

## 2015-08-14 ENCOUNTER — Ambulatory Visit (INDEPENDENT_AMBULATORY_CARE_PROVIDER_SITE_OTHER): Payer: Medicare Other | Admitting: Internal Medicine

## 2015-08-14 ENCOUNTER — Encounter: Payer: Self-pay | Admitting: Internal Medicine

## 2015-08-14 ENCOUNTER — Ambulatory Visit (INDEPENDENT_AMBULATORY_CARE_PROVIDER_SITE_OTHER)
Admission: RE | Admit: 2015-08-14 | Discharge: 2015-08-14 | Disposition: A | Discharge status: Home / Self Care | Payer: Medicare Other | Source: Ambulatory Visit | Attending: Internal Medicine | Admitting: Internal Medicine

## 2015-08-14 VITALS — BP 124/80 | HR 108 | Ht <= 58 in | Wt 142.0 lb

## 2015-08-14 DIAGNOSIS — J8410 Pulmonary fibrosis, unspecified: Secondary | ICD-10-CM

## 2015-08-14 DIAGNOSIS — R05 Cough: Secondary | ICD-10-CM | POA: Diagnosis not present

## 2015-08-14 DIAGNOSIS — Z23 Encounter for immunization: Secondary | ICD-10-CM | POA: Diagnosis not present

## 2015-08-14 DIAGNOSIS — Z720 Tobacco use: Secondary | ICD-10-CM | POA: Diagnosis not present

## 2015-08-14 DIAGNOSIS — F17210 Nicotine dependence, cigarettes, uncomplicated: Secondary | ICD-10-CM

## 2015-08-14 DIAGNOSIS — R059 Cough, unspecified: Secondary | ICD-10-CM

## 2015-08-14 NOTE — Progress Notes (Signed)
Subjective:    Patient ID: [name], female    DOB: 05/28/1971 MRN: [id number]    Brief patient profile:  44 yowf active smoker with nl pfts 03/2010 performed  to clear her for mtx rx for psoriasis and took it only once  ? Underlying pf since ? 2014   History of Present Illness  Nov 2014 Pna > [facility]  Dec 2014 > [facility] pna dx (cxr report suggest bilateral ILD)  01/20/2014 f/u ov/Murray Durrell re: ? Copd > not present on pfts  Chief Complaint  Patient presents with  . Followup with PFT    Pt states cough and SOB have improved. No new co's today.   Not limited by breathing from desired activities   rec Ok to use combivent up to 4 x daily when sick but if needing to use it more than twice daily on a regular basis we need to see you back here asap The key is to stop smoking completely before smoking completely stops you!    08/14/2015 f/u ov/Izora Benn re:  Intermittent acute bronchitis/ ? [place] pf  Still smoking  Chief Complaint  Patient presents with  . Follow-up    Pt states had to go to UC approx 2 wks ago with c/o fever, SOB and aches- was txed with levaquin and her symptoms have resolved.     Has saba, rarely uses when not sick and all back to baseline now on no rx at all.  Never had purulent sputum with this flare of ? pna   Not limited by breathing from desired activities      No obvious day to day or daytime variabilty or assoc   cp or chest tightness, subjective wheeze overt sinus or hb symptoms. No unusual exp hx or h/o childhood pna/ asthma or knowledge of premature birth.  Sleeping ok without nocturnal  or early am exacerbation  of respiratory  c/o's or need for noct saba. Also denies any obvious fluctuation of symptoms with weather or environmental changes or other aggravating or alleviating factors except as outlined above   Current Medications, Allergies, Complete Past Medical History, Past Surgical History, Family History, and Social History were reviewed in  [employer] record.  ROS  The following are not active complaints unless bolded sore throat, dysphagia, dental problems, itching, sneezing,  nasal congestion or excess/ purulent secretions, ear ache,   fever, chills, sweats, unintended wt loss, pleuritic or exertional cp, hemoptysis,  orthopnea pnd or leg swelling, presyncope, palpitations, heartburn, abdominal pain, anorexia, nausea, vomiting, diarrhea  or change in bowel or urinary habits, change in stools or urine, dysuria,hematuria,  rash, arthralgias, visual complaints, headache, numbness weakness or ataxia or problems with walking or coordination,  change in mood/affect or memory.         Past Medical History:  LUPUS (ICD-710.0) ? Accuracy of this dx?  - Walking sats = 94 -91 rapid walk x 3 laps Apr 17, 2010  ASTHMA (ICD-493.90)  - PFT's wnl Apr 17, 2010 no dlco  PSORIASIS (ICD-696.1)    Social History:  Current smoker since age 44.            Objective:   Physical Exam   01/20/2014      143 >  08/14/2015  142   Wt Readings from Last 3 Encounters:  12/09/13 136 lb (61.689 kg)  10/05/13 130 lb 4.7 oz (59.1 kg)  04/17/10 115 lb (52.164 kg)    Vital signs reviewed  Physical Exam  Additional Exam: amb wf nad    HEENT: nl dentition, turbinates, and orophanx. Nl external ear canals without cough reflex  Neck without JVD/Nodes/TM  Lungs clear to A and P bilaterally without cough on insp or exp maneuvers  RRR no s3 or murmur or increase in P2  Abd soft and benign with nl excursion in the supine position. No bruits or organomegaly  Ext warm without calf tenderness, cyanosis clubbing or edema  Skin warm and dry without lesions     CXR PA and Lateral:   08/14/2015 :     I personally reviewed images and agree with radiology impression as follows:    No active cardiopulmonary disease.       Assessment & Plan:

## 2015-08-14 NOTE — Patient Instructions (Signed)
Please remember to go to the x-ray department downstairs for your tests - we will call you with the results when they are available.  Please schedule a follow up office visit in 6 weeks, call sooner if needed with pfts

## 2015-08-15 ENCOUNTER — Encounter: Payer: Self-pay | Admitting: Internal Medicine

## 2015-08-15 NOTE — Assessment & Plan Note (Signed)
>   3 min discussion  I emphasized that although we never turn away smokers from the pulmonary clinic, we do ask that they understand that the recommendations that we make  won't work nearly as well in the presence of continued cigarette exposure.  In fact, we may very well  reach a point where we can't promise to help the patient if he/she can't quit smoking. (We can and will promise to try to help, we just can't promise what we recommend will really work)   Could not get her to commit to quit at this point

## 2015-08-15 NOTE — Assessment & Plan Note (Signed)
-    PFT's 01/20/14 VC 2.35 (80%) no obst and dlco 68 corrects to 91%   Still concerned about active pf.  DDx for pulmonary fibrosis  includes idiopathic pulmonary fibrosis, pulmonary fibrosis associated with rheumatologic diseases (which have a relatively benign course in most cases) , adverse effect from  drugs such as chemotherapy or amiodarone exposure, nonspecific interstitial pneumonia which is typically steroid responsive, and chronic hypersensitivity pneumonitis.   In active  smokers Langerhan's Cell  Histiocyctosis (eosinophilic granuomatosis),  DIP,  and Respiratory Bronchiolitis ILD also need to be considered, and are at top of the usual list of suspects here  Critical she stop smoking now > see sep a/p  I had an extended discussion with the patient reviewing all relevant studies completed to date and  lasting 15 to 20 minutes of a 25 minute visit    Each maintenance medication was reviewed in detail including most importantly the difference between maintenance and prns and under what circumstances the prns are to be triggered using an action plan format that is not reflected in the computer generated alphabetically organized AVS.    Please see instructions for details which were reviewed in writing and the patient given a copy highlighting the part that I personally wrote and discussed at today's ov.

## 2015-08-15 NOTE — Assessment & Plan Note (Signed)
Improved back to baseline with clear cxr/ no change in rx needed

## 2015-10-01 ENCOUNTER — Other Ambulatory Visit: Payer: Self-pay

## 2015-10-01 DIAGNOSIS — Z1231 Encounter for screening mammogram for malignant neoplasm of breast: Secondary | ICD-10-CM

## 2015-10-02 ENCOUNTER — Ambulatory Visit (INDEPENDENT_AMBULATORY_CARE_PROVIDER_SITE_OTHER): Payer: Medicare Other | Admitting: Internal Medicine

## 2015-10-02 ENCOUNTER — Encounter: Payer: Self-pay | Admitting: Internal Medicine

## 2015-10-02 VITALS — BP 120/80 | HR 100 | Ht <= 58 in | Wt 134.0 lb

## 2015-10-02 DIAGNOSIS — J8410 Pulmonary fibrosis, unspecified: Secondary | ICD-10-CM | POA: Diagnosis not present

## 2015-10-02 DIAGNOSIS — Z23 Encounter for immunization: Secondary | ICD-10-CM | POA: Diagnosis not present

## 2015-10-02 DIAGNOSIS — F17210 Nicotine dependence, cigarettes, uncomplicated: Secondary | ICD-10-CM

## 2015-10-02 DIAGNOSIS — Z720 Tobacco use: Secondary | ICD-10-CM

## 2015-10-02 DIAGNOSIS — R05 Cough: Secondary | ICD-10-CM

## 2015-10-02 DIAGNOSIS — R059 Cough, unspecified: Secondary | ICD-10-CM

## 2015-10-02 LAB — PULMONARY FUNCTION TEST
DL/VA % PRED: 85 %
DL/VA: 3.32 ml/min/mmHg/L
DLCO UNC % PRED: 59 %
DLCO unc: 9.67 ml/min/mmHg
FEF 25-75 POST: 2.6 L/s
FEF 25-75 PRE: 2.57 L/s
FEF2575-%CHANGE-POST: 1 %
FEF2575-%PRED-POST: 101 %
FEF2575-%PRED-PRE: 100 %
FEV1-%Change-Post: -1 %
FEV1-%Pred-Post: 74 %
FEV1-%Pred-Pre: 75 %
FEV1-PRE: 1.77 L
FEV1-Post: 1.75 L
FEV1FVC-%Change-Post: 2 %
FEV1FVC-%PRED-PRE: 110 %
FEV6-%CHANGE-POST: -3 %
FEV6-%PRED-POST: 67 %
FEV6-%Pred-Pre: 69 %
FEV6-PRE: 1.98 L
FEV6-Post: 1.92 L
FEV6FVC-%Pred-Post: 102 %
FEV6FVC-%Pred-Pre: 102 %
FVC-%CHANGE-POST: -3 %
FVC-%PRED-POST: 66 %
FVC-%Pred-Pre: 68 %
FVC-Post: 1.92 L
FVC-Pre: 1.98 L
POST FEV1/FVC RATIO: 91 %
Post FEV6/FVC ratio: 100 %
Pre FEV1/FVC ratio: 89 %
Pre FEV6/FVC Ratio: 100 %
RV % pred: 88 %
RV: 1.25 L
TLC % pred: 77 %
TLC: 3.23 L

## 2015-10-02 MED ORDER — IPRATROPIUM-ALBUTEROL 20-100 MCG/ACT IN AERS: 1.0000 | INHALATION_SPRAY | Freq: Four times a day (QID) | RESPIRATORY_TRACT | Status: AC

## 2015-10-02 NOTE — Progress Notes (Signed)
Subjective:    Patient ID: [name], female    DOB: January 01, 1971 MRN: [id number]    Brief patient profile:  44 yowf active smoker with nl pfts 03/2010 performed  to clear her for mtx rx for psoriasis and took it only once  ? Underlying pf since ? 2014   History of Present Illness  Nov 2014 Pna > [facility]  Dec 2014 > [facility] med pna dx (cxr report suggest bilateral ILD)  01/20/2014 f/u ov/Wert re: ? Copd > not present on pfts  Chief Complaint  Patient presents with  . Followup with PFT    Pt states cough and SOB have improved. No new co's today.   Not limited by breathing from desired activities   rec Ok to use combivent up to 4 x daily when sick but if needing to use it more than twice daily on a regular basis we need to see you back here asap The key is to stop smoking completely before smoking completely stops you!    08/14/2015 f/u ov/Wert re:  Intermittent acute bronchitis/ ? [place] pf  Still smoking  Chief Complaint  Patient presents with  . Follow-up    Pt states had to go to UC approx 2 wks ago with c/o fever, SOB and aches- was txed with levaquin and her symptoms have resolved.   rec No change rx     10/02/2015  f/u ov/Wert re: ? Mild pf related to lupus/  still smoking s obst on pfts  Chief Complaint  Patient presents with  . Follow-up    PFT done today. Pt states her breathing is doing well. No new co's today.   Not limited by breathing from desired activities       No obvious day to day or daytime variabilty or assoc cough   cp or chest tightness, subjective wheeze overt sinus or hb symptoms. No unusual exp hx or h/o childhood pna/ asthma or knowledge of premature birth.  Sleeping ok without nocturnal  or early am exacerbation  of respiratory  c/o's or need for noct saba. Also denies any obvious fluctuation of symptoms with weather or environmental changes or other aggravating or alleviating factors except as outlined above   Current Medications, Allergies,  Complete Past Medical History, Past Surgical History, Family History, and Social History were reviewed in [employer] record.  ROS  The following are not active complaints unless bolded sore throat, dysphagia, dental problems, itching, sneezing,  nasal congestion or excess/ purulent secretions, ear ache,   fever, chills, sweats, unintended wt loss, pleuritic or exertional cp, hemoptysis,  orthopnea pnd or leg swelling, presyncope, palpitations, heartburn, abdominal pain, anorexia, nausea, vomiting, diarrhea  or change in bowel or urinary habits, change in stools or urine, dysuria,hematuria,  rash, arthralgias, visual complaints, headache, numbness weakness or ataxia or problems with walking or coordination,  change in mood/affect or memory.         Past Medical History:  LUPUS (ICD-710.0) ? Accuracy of this dx?  - Walking sats = 94 -91 rapid walk x 3 laps Apr 17, 2010  ASTHMA (ICD-493.90)  - PFT's wnl Apr 17, 2010 no dlco  PSORIASIS (ICD-696.1)    Social History:  Current smoker since age 44.            Objective:   Physical Exam   01/20/2014      143 >  08/14/2015  142  > 10/02/2015 134  Wt Readings from Last 3 Encounters:  12/09/13 136 lb (61.689 kg)  10/05/13 130 lb 4.7 oz (59.1 kg)  04/17/10 115 lb (52.164 kg)    Vital signs reviewed    Physical Exam  Additional Exam: amb wf nad    HEENT: nl dentition, turbinates, and orophanx. Nl external ear canals without cough reflex  Neck without JVD/Nodes/TM  Lungs clear to A and P bilaterally without cough on insp or exp maneuvers  RRR no s3 or murmur or increase in P2  Abd soft and benign with nl excursion in the supine position. No bruits or organomegaly  Ext warm without calf tenderness, cyanosis clubbing or edema  Skin warm and dry without lesions     CXR PA and Lateral:   08/14/2015 :     I personally reviewed images and agree with radiology impression as follows:    No active cardiopulmonary  disease.       Assessment & Plan:

## 2015-10-02 NOTE — Patient Instructions (Addendum)
Prevnar 13 today is one and done, the other you need a booster in 10/2018   Most important aspect of your care: The key is to stop smoking completely before smoking completely stops you!   Only use your albuterol- ipatropium (combivent) as a rescue medication to be used if you can't catch your breath by resting or doing a relaxed purse lip breathing pattern.  - The less you use it, the better it will work when you need it. - Ok to use up to 1 puffs  every 4 hours if you must but call for immediate appointment if use goes up over your usual need - Don't leave home without it !!  (think of it like the spare tire for your car)   Please schedule a follow up visit in 12 months but call sooner if needed

## 2015-10-02 NOTE — Progress Notes (Signed)
PFT done today. 

## 2015-10-03 NOTE — Assessment & Plan Note (Signed)
-   PFT's 01/20/14 VC 2.35 (80%) no obst and dlco 68 corrects to 91%  - PFT's  10/02/2015  VC  1.98  No obst   DLCO  59 % corrects to 85 % for alv volume    Despite active smoking there is no def evidence to support progression of either fibrotic or obstructive lung disease at this point.  I had an extended discussion with the patient reviewing all relevant studies completed to date and  lasting 15 to 20 minutes of a 25 minute visit    Each maintenance medication was reviewed in detail including most importantly the difference between maintenance and prns and under what circumstances the prns are to be triggered using an action plan format that is not reflected in the computer generated alphabetically organized AVS.    Please see instructions for details which were reviewed in writing and the patient given a copy highlighting the part that I personally wrote and discussed at today's ov.

## 2015-10-03 NOTE — Assessment & Plan Note (Signed)

## 2015-10-11 ENCOUNTER — Ambulatory Visit
Admission: RE | Admit: 2015-10-11 | Discharge: 2015-10-11 | Disposition: A | Discharge status: Home / Self Care | Payer: Medicare Other | Source: Ambulatory Visit

## 2015-10-11 DIAGNOSIS — Z1231 Encounter for screening mammogram for malignant neoplasm of breast: Secondary | ICD-10-CM

## 2015-10-12 ENCOUNTER — Encounter (HOSPITAL_COMMUNITY): Payer: Self-pay

## 2015-10-12 ENCOUNTER — Telehealth: Payer: Self-pay | Admitting: Internal Medicine

## 2015-10-12 ENCOUNTER — Emergency Department (HOSPITAL_COMMUNITY): Payer: Medicare Other

## 2015-10-12 ENCOUNTER — Emergency Department (HOSPITAL_COMMUNITY)
Admission: EM | Admit: 2015-10-12 | Discharge: 2015-10-12 | Discharge status: Against Medical Advice | Payer: Medicare Other | Attending: Emergency Medicine | Admitting: Emergency Medicine

## 2015-10-12 DIAGNOSIS — J45901 Unspecified asthma with (acute) exacerbation: Secondary | ICD-10-CM | POA: Diagnosis not present

## 2015-10-12 DIAGNOSIS — R0902 Hypoxemia: Secondary | ICD-10-CM | POA: Diagnosis not present

## 2015-10-12 DIAGNOSIS — Z872 Personal history of diseases of the skin and subcutaneous tissue: Secondary | ICD-10-CM | POA: Diagnosis not present

## 2015-10-12 DIAGNOSIS — K219 Gastro-esophageal reflux disease without esophagitis: Secondary | ICD-10-CM | POA: Diagnosis not present

## 2015-10-12 DIAGNOSIS — Z79899 Other long term (current) drug therapy: Secondary | ICD-10-CM | POA: Diagnosis not present

## 2015-10-12 DIAGNOSIS — G40909 Epilepsy, unspecified, not intractable, without status epilepticus: Secondary | ICD-10-CM | POA: Insufficient documentation

## 2015-10-12 DIAGNOSIS — Z8701 Personal history of pneumonia (recurrent): Secondary | ICD-10-CM | POA: Insufficient documentation

## 2015-10-12 DIAGNOSIS — Z720 Tobacco use: Secondary | ICD-10-CM | POA: Diagnosis not present

## 2015-10-12 DIAGNOSIS — J159 Unspecified bacterial pneumonia: Secondary | ICD-10-CM | POA: Diagnosis not present

## 2015-10-12 DIAGNOSIS — E039 Hypothyroidism, unspecified: Secondary | ICD-10-CM | POA: Insufficient documentation

## 2015-10-12 DIAGNOSIS — J189 Pneumonia, unspecified organism: Secondary | ICD-10-CM

## 2015-10-12 DIAGNOSIS — R000 Tachycardia, unspecified: Secondary | ICD-10-CM | POA: Diagnosis not present

## 2015-10-12 DIAGNOSIS — F419 Anxiety disorder, unspecified: Secondary | ICD-10-CM | POA: Diagnosis not present

## 2015-10-12 DIAGNOSIS — F319 Bipolar disorder, unspecified: Secondary | ICD-10-CM | POA: Diagnosis not present

## 2015-10-12 DIAGNOSIS — Z880 Allergy status to penicillin: Secondary | ICD-10-CM | POA: Insufficient documentation

## 2015-10-12 DIAGNOSIS — Z8739 Personal history of other diseases of the musculoskeletal system and connective tissue: Secondary | ICD-10-CM | POA: Insufficient documentation

## 2015-10-12 DIAGNOSIS — R509 Fever, unspecified: Secondary | ICD-10-CM | POA: Diagnosis present

## 2015-10-12 HISTORY — DX: Pulmonary fibrosis, unspecified: J84.10

## 2015-10-12 LAB — BASIC METABOLIC PANEL
Anion gap: 8 (ref 5–15)
BUN: 9 mg/dL (ref 6–20)
CHLORIDE: 97 mmol/L — AB (ref 101–111)
CO2: 27 mmol/L (ref 22–32)
CREATININE: 0.71 mg/dL (ref 0.44–1.00)
Calcium: 9.1 mg/dL (ref 8.9–10.3)
GFR calc non Af Amer: >60 mL/min (ref >60)
Glucose, Bld: 119 mg/dL — ABNORMAL HIGH (ref 65–99)
Potassium: 3.5 mmol/L (ref 3.5–5.1)
Sodium: 132 mmol/L — ABNORMAL LOW (ref 135–145)

## 2015-10-12 LAB — CBC WITH DIFFERENTIAL/PLATELET
Basophils Absolute: 0.1 10*3/uL (ref 0.0–0.1)
Basophils Relative: 1 %
EOS ABS: 1.8 10*3/uL — AB (ref 0.0–0.7)
Eosinophils Relative: 14 %
HEMATOCRIT: 37 % (ref 36.0–46.0)
HEMOGLOBIN: 13.1 g/dL (ref 12.0–15.0)
LYMPHS ABS: 2.1 10*3/uL (ref 0.7–4.0)
LYMPHS PCT: 17 %
MCH: 28.9 pg (ref 26.0–34.0)
MCHC: 35.4 g/dL (ref 30.0–36.0)
MCV: 81.5 fL (ref 78.0–100.0)
MONOS PCT: 4 %
Monocytes Absolute: 0.5 10*3/uL (ref 0.1–1.0)
NEUTROS ABS: 7.9 10*3/uL — AB (ref 1.7–7.7)
NEUTROS PCT: 64 %
Platelets: 179 10*3/uL (ref 150–400)
RBC: 4.54 MIL/uL (ref 3.87–5.11)
RDW: 15 % (ref 11.5–15.5)
WBC: 12.4 10*3/uL — ABNORMAL HIGH (ref 4.0–10.5)

## 2015-10-12 MED ORDER — LEVOFLOXACIN 750 MG PO TABS
750.0000 mg | ORAL_TABLET | Freq: Once | ORAL | Status: AC
  Administered 2015-10-12: 750 mg via ORAL
  Filled 2015-10-12: qty 1

## 2015-10-12 MED ORDER — LEVOFLOXACIN 750 MG PO TABS: 750.0000 mg | ORAL_TABLET | Freq: Every day | ORAL | Status: DC

## 2015-10-12 MED ORDER — METHYLPREDNISOLONE SODIUM SUCC 125 MG IJ SOLR
125.0000 mg | Freq: Once | INTRAMUSCULAR | Status: AC
  Administered 2015-10-12: 125 mg via INTRAVENOUS
  Filled 2015-10-12: qty 2

## 2015-10-12 MED ORDER — PREDNISONE 10 MG PO TABS: 40.0000 mg | ORAL_TABLET | Freq: Every day | ORAL | Status: DC

## 2015-10-12 MED ORDER — IPRATROPIUM-ALBUTEROL 0.5-2.5 (3) MG/3ML IN SOLN
3.0000 mL | Freq: Once | RESPIRATORY_TRACT | Status: AC
  Administered 2015-10-12: 3 mL via RESPIRATORY_TRACT
  Filled 2015-10-12: qty 3

## 2015-10-12 NOTE — ED Notes (Signed)
Pt ambulatory to and from nearby restroom approx 50 ft; pulse ox 96% on RA.

## 2015-10-12 NOTE — ED Provider Notes (Signed)
CSN: [id number]     Arrival date & time 10/12/15  1239 History   First MD Initiated Contact with Patient 10/12/15 1250     Chief Complaint  Patient presents with  . Fever  . Shortness of Breath  . Cough     The history is provided by the patient. No language interpreter was used.   Ms. Allery presents for evaluation of fever, cough, sob.  Sxs started three days ago with generalized body aches, cough, sob.  Fever to 103 two days ago.  No chest pain, vomiting, abdominal pain.  She was seen at urgent care yesterday and had an oxygen level of 85% and CXR concerning for sarcoid and it was recommended that she go to the ED for further evaluation.  She is followed by Dr. Melvyn Novas with Pulmonology for possible pulmonary fibrosis and was seen a week ago.  She takes combivent daily and smokes daily.    Past Medical History  Diagnosis Date  . Interstitial cystitis   . Bipolar 1 disorder ([place])   . Acid reflux   . Hypothyroidism   . Hypothyroid   . Psoriasis   . Asthma   . Pneumonia   . Anxiety   . Depression   . Seizures ([place])     2000, medication related from psychiatry, meds adjusted, none sense then  . DDD (degenerative disc disease)     knees and back (DDD)  . Pulmonary fibrosis [facility])    Past Surgical History  Procedure Laterality Date  . Total knee arthroplasty      x15  . Bladder pacemaker      x6  . Wisdom tooth extraction    . Dilatation & curettage/hysteroscopy with myosure N/A 12/26/2014    Procedure: DILATATION & CURETTAGE/HYSTEROSCOPY WITH MYOSURE;  Surgeon: Crawford Givens, MD;  Location: [facility];  Service: Gynecology;  Laterality: N/A;  . Dilitation & currettage/hystroscopy with hydrothermal ablation N/A 12/26/2014    Procedure: DILATATION & CURETTAGE/HYSTEROSCOPY WITH HYDROTHERMAL ABLATION;  Surgeon: Crawford Givens, MD;  Location: [facility];  Service: Gynecology;  Laterality: N/A;   Family History  Problem Relation Age of Onset  . Dementia Mother   . Diabetes type II Sister    . Alcoholism Sister    Social History  Substance Use Topics  . Smoking status: Current Every Day Smoker -- 1.00 packs/day for 29 years    Types: Cigarettes  . Smokeless tobacco: Never Used  . Alcohol Use: No   OB History    No data available     Review of Systems  All other systems reviewed and are negative.     Allergies  Diphenhydramine; Silicone; Zyrtec; Allegra; Amoxicillin; Claritin; Clindamycin/lincomycin; Benadryl; Chantix; Propranolol; and Lamictal  Home Medications   Prior to Admission medications   Medication Sig Start Date End Date Taking? Authorizing Provider  clonazePAM (KLONOPIN) 0.5 MG tablet Take 1 tablet by mouth 3 (three) times daily as needed. 09/13/15  Yes Historical Provider, MD  desmopressin (DDAVP) 0.2 MG tablet Take 2 tablets (0.4 mg total) by mouth at bedtime. 06/15/15  Yes Niel Hummer, NP  diclofenac sodium (VOLTAREN) 1 % GEL Apply 1 application topically once a week. Patient taking differently: Apply 1 application topically daily as needed (pain).  06/15/15  Yes Niel Hummer, NP  Doxepin HCl 6 MG TABS Take 12 mg by mouth at bedtime.   Yes Historical Provider, MD  esomeprazole (NEXIUM) 40 MG capsule Take 1 capsule (40 mg total) by mouth daily before breakfast. 06/15/15  Yes Niel Hummer, NP  gabapentin (NEURONTIN) 300 MG capsule Take 300-900 mg by mouth 2 (two) times daily. 1 capsule= 300mg  every am and 3= 900mg  in the afternoon   Yes Historical Provider, MD  Ipratropium-Albuterol (COMBIVENT RESPIMAT) 20-100 MCG/ACT AERS respimat Inhale 1 puff into the lungs every 6 (six) hours. One puff up to 4x daily Patient taking differently: Inhale 1 puff into the lungs every 6 (six) hours as needed for wheezing or shortness of breath. One puff up to 4x daily 10/02/15  Yes Tanda Rockers, MD  Levomilnacipran HCl ER (FETZIMA) 80 MG CP24 Take 80 mg by mouth daily. 06/15/15  Yes Niel Hummer, NP  levothyroxine (SYNTHROID, LEVOTHROID) 50 MCG tablet Take 1 tablet (50  mcg total) by mouth daily. 06/15/15  Yes Niel Hummer, NP  meloxicam (MOBIC) 15 MG tablet Take 1 tablet (15 mg total) by mouth daily. 06/15/15  Yes Niel Hummer, NP  oxybutynin (DITROPAN XL) 15 MG 24 hr tablet Take 1 tablet (15 mg total) by mouth daily. 06/15/15  Yes Niel Hummer, NP  tiZANidine (ZANAFLEX) 4 MG tablet Take 4 mg by mouth 3 (three) times daily as needed for muscle spasms.  06/10/15 12/07/15 Yes Historical Provider, MD  ziprasidone (GEODON) 80 MG capsule Take 2 capsules by mouth at bedtime. 08/15/15  Yes Historical Provider, MD  fentaNYL (DURAGESIC - DOSED MCG/HR) 25 MCG/HR patch Place 1 patch (25 mcg total) onto the skin every 3 (three) days. 06/15/15   Niel Hummer, NP   BP 122/76 mmHg  Pulse 89  Temp(Src) 97.6 F (36.4 C) (Oral)  Resp 18  Ht 4\' 10"  (1.473 m)  Wt 132 lb (59.875 kg)  BMI 27.60 kg/m2  SpO2 100% Physical Exam  Constitutional: She is oriented to person, place, and time. She appears well-developed and well-nourished.  HENT:  Head: Normocephalic and atraumatic.  Cardiovascular: Regular rhythm.   No murmur heard. Tachycardic  Pulmonary/Chest: Effort normal. No respiratory distress.  Diffuse fine crackles  Abdominal: Soft. There is no tenderness. There is no rebound and no guarding.  Musculoskeletal: She exhibits no edema or tenderness.  Neurological: She is alert and oriented to person, place, and time.  Skin: Skin is warm and dry.  Psychiatric: She has a normal mood and affect. Her behavior is normal.  Nursing note and vitals reviewed.   ED Course  Procedures (including critical care time) Labs Review Labs Reviewed  CBC WITH DIFFERENTIAL/PLATELET - Abnormal; Notable for the following:    WBC 12.4 (*)    Neutro Abs 7.9 (*)    Eosinophils Absolute 1.8 (*)    All other components within normal limits  BASIC METABOLIC PANEL  Randolm Idol, ED    Imaging Review Mm Digital Screening Bilateral  10/12/2015  CLINICAL DATA:  Screening. EXAM: DIGITAL  SCREENING BILATERAL MAMMOGRAM WITH CAD COMPARISON:  Previous exam(s). ACR Breast Density Category b: There are scattered areas of fibroglandular density. FINDINGS: There are no findings suspicious for malignancy. Images were processed with CAD. IMPRESSION: No mammographic evidence of malignancy. A result letter of this screening mammogram will be mailed directly to the patient. RECOMMENDATION: Screening mammogram in one year. (Code:SM-B-01Y) BI-RADS CATEGORY  1: Negative. Electronically Signed   By: Skipper Cliche M.D.   On: 10/12/2015 10:46   I have personally reviewed and evaluated these images and lab results as part of my medical decision-making.   EKG Interpretation   Date/Time:  Friday October 12 2015 12:57:55 EST Ventricular Rate:  95 PR Interval:  134 QRS Duration: 70 QT Interval:  384 QTC Calculation: 483 R Axis:   79 Text Interpretation:  Sinus rhythm Artifact in lead(s) II III aVR aVL aVF  V3 V4 V5 V6 Confirmed by Hazle Coca [id number]) on 10/12/2015 1:10:53 PM      MDM   Final diagnoses:  CAP (community acquired pneumonia)  Hypoxia    Patient here for evaluation of fever and shortness of breath. History and presentation is consistent with likely underlying lung disease as well as clinical pneumonia. Presentation is not consistent with PE, CHF, ACS. Patient feels improved following breathing treatments with no significant change in her lung exam. Treating for community-acquired pneumonia with Levaquin, also concern for underlying interstitial lung disease, treating with course of steroids. He recommended admission given patient with hypoxia when she is off the supplemental oxygen. Patient refuses admission to the hospital. She voices understanding and concerns and reason for hospitalization and risks of refusing hospitalization. Discussions were held in front of her significant other. Discussed the patient will need close follow-up with pulmonology, return precautions were  discussed. Patient is welcome to return any time for repeat evaluation.    Quintella Reichert, MD 10/12/15 1534

## 2015-10-12 NOTE — ED Notes (Addendum)
Rees placed pt on RA for approx 20 minutes; saturation resting 77% (see VS 1450) on RA noted on monitor; pt placed back on 2 lpm [place]; Rees made aware.

## 2015-10-12 NOTE — Discharge Instructions (Signed)
Community-Acquired Pneumonia, Adult Pneumonia is an infection of the lungs. There are different types of pneumonia. One type can develop while a person is in a hospital. A different type, called community-acquired pneumonia, develops in people who are not, or have not recently been, in the hospital or other health care facility.  CAUSES Pneumonia may be caused by bacteria, viruses, or funguses. Community-acquired pneumonia is often caused by Streptococcus pneumonia bacteria. These bacteria are often passed from one person to another by breathing in droplets from the cough or sneeze of an infected person. RISK FACTORS The condition is more likely to develop in:  People who havechronic diseases, such as chronic obstructive pulmonary disease (COPD), asthma, congestive heart failure, cystic fibrosis, diabetes, or kidney disease.  People who haveearly-stage or late-stage HIV.  People who havesickle cell disease.  People who havehad their spleen removed (splenectomy).  People who havepoor Human resources officer.  People who havemedical conditions that increase the risk of breathing in (aspirating) secretions their own mouth and nose.   People who havea weakened immune system (immunocompromised).  People who smoke.  People whotravel to areas where pneumonia-causing germs commonly exist.  People whoare around animal habitats or animals that have pneumonia-causing germs, including birds, bats, rabbits, cats, and farm animals. SYMPTOMS Symptoms of this condition include:  Adry cough.  A wet (productive) cough.  Fever.  Sweating.  Chest pain, especially when breathing deeply or coughing.  Rapid breathing or difficulty breathing.  Shortness of breath.  Shaking chills.  Fatigue.  Muscle aches. DIAGNOSIS Your health care provider will take a medical history and perform a physical exam. You may also have other tests, including:  Imaging studies of your chest, including  X-rays.  Tests to check your blood oxygen level and other blood gases.  Other tests on blood, mucus (sputum), fluid around your lungs (pleural fluid), and urine. If your pneumonia is severe, other tests may be done to identify the specific cause of your illness. TREATMENT The type of treatment that you receive depends on many factors, such as the cause of your pneumonia, the medicines you take, and other medical conditions that you have. For most adults, treatment and recovery from pneumonia may occur at home. In some cases, treatment must happen in a hospital. Treatment may include:  Antibiotic medicines, if the pneumonia was caused by bacteria.  Antiviral medicines, if the pneumonia was caused by a virus.  Medicines that are given by mouth or through an IV tube.  Oxygen.  Respiratory therapy. Although rare, treating severe pneumonia may include:  Mechanical ventilation. This is done if you are not breathing well on your own and you cannot maintain a safe blood oxygen level.  Thoracentesis. This procedureremoves fluid around one lung or both lungs to help you breathe better. HOME CARE INSTRUCTIONS  Take over-the-counter and prescription medicines only as told by your health care provider.  Only takecough medicine if you are losing sleep. Understand that cough medicine can prevent your body's natural ability to remove mucus from your lungs.  If you were prescribed an antibiotic medicine, take it as told by your health care provider. Do not stop taking the antibiotic even if you start to feel better.  Sleep in a semi-upright position at night. Try sleeping in a reclining chair, or place a few pillows under your head.  Do not use tobacco products, including cigarettes, chewing tobacco, and e-cigarettes. If you need help quitting, ask your health care provider.  Drink enough water to keep your urine  clear or pale yellow. This will help to thin out mucus secretions in your  lungs. PREVENTION There are ways that you can decrease your risk of developing community-acquired pneumonia. Consider getting a pneumococcal vaccine if:  You are older than 46 years of age.  You are older than 46 years of age and are undergoing cancer treatment, have chronic lung disease, or have other medical conditions that affect your immune system. Ask your health care provider if this applies to you. There are different types and schedules of pneumococcal vaccines. Ask your health care provider which vaccination option is best for you. You may also prevent community-acquired pneumonia if you take these actions:  Get an influenza vaccine every year. Ask your health care provider which type of influenza vaccine is best for you.  Go to the dentist on a regular basis.  Wash your hands often. Use hand sanitizer if soap and water are not available. SEEK MEDICAL CARE IF:  You have a fever.  You are losing sleep because you cannot control your cough with cough medicine. SEEK IMMEDIATE MEDICAL CARE IF:  You have worsening shortness of breath.  You have increased chest pain.  Your sickness becomes worse, especially if you are an older adult or have a weakened immune system.  You cough up blood.   This information is not intended to replace advice given to you by your health care provider. Make sure you discuss any questions you have with your health care provider.   Document Released: 11/17/2005 Document Revised: 08/08/2015 Document Reviewed: 03/14/2015 Elsevier Interactive Patient Education 2016 Else[employer]  Hypoxemia Hypoxemia occurs when your blood does not contain enough oxygen. The body cannot work well when it does not have enough oxygen because every part of your body needs oxygen. Oxygen travels to all parts of the body through your blood. Hypoxemia can develop suddenly or can come on slowly. CAUSES Some common causes of hypoxemia include:  Long-term (chronic) lung  diseases, such as chronic obstructive pulmonary disease (COPD) or interstitial lung disease.  Disorders that affect breathing at night, such as sleep apnea.  Fluid buildup in your lungs (pulmonary edema).  Lung infection (pneumonia).  Lung or throat cancer.  Abnormal blood flow that bypasses the lungs (shunt).  Certain diseasesthat affect nerves or muscles.  A collapsed lung (pneumothorax).  A blood clot in the lungs (pulmonary embolus).  Certain types of heart disease.  Slow or shallow breathing (hypoventilation).  Certain medicines.  High altitudes.  Toxic chemicals and gases. SIGNS AND SYMPTOMS Not everyone who has hypoxemia will develop symptoms. If the hypoxemia developed quickly, you will likely have symptoms such as shortness of breath. If the hypoxemia came on slowly over months or years, you may not notice any symptoms. Symptoms can include:  Shortness of breath (dyspnea).  Bluish color of the skin, lips, or nail beds.  Breathing that is fast, noisy, or shallow.  A fast heartbeat.  Feeling tired or sleepy.  Being confused or feeling anxious. DIAGNOSIS To determine if you have hypoxemia, your health care provider may perform:  A physical exam.  Blood tests.  A pulse oximetry. A sensor will be put on your finger, toe, or earlobe to measure the percent of oxygen in your blood. TREATMENT You will likely be treated with oxygen therapy. Depending on the cause of your hypoxemia, you may need oxygen for a short time (weeks or months), or you may need it indefinitely. Your health care provider may also recommend other therapies to  treat the underlying cause of your hypoxemia. HOME CARE INSTRUCTIONS  Only take over-the-counter or prescription medicines as directed by your health care provider.  Follow oxygen safety measures if you are on oxygen therapy. These may include:  Always having a backup supply of oxygen.  Not allowing anyone to smoke around  oxygen.  Handling the oxygen tanks carefully and as instructed.  If you smoke, quit. Stay away from people who smoke.  Follow up with your health care provider as directed. SEEK MEDICAL CARE IF:  You have any concerns about your oxygen therapy.  You still have trouble breathing.  You become short of breath when you exercise.  You are tired when you wake up.  You have a headache when you wake up. SEEK IMMEDIATE MEDICAL CARE IF:   Your breathing gets worse.  You have new shortness of breath with normal activity.  You have a bluish color of the skin, lips, or nail beds.  You have confusion or cloudy thinking.  You cough up dark mucus.  You have chest pain.  You have a fever. MAKE SURE YOU:  Understand these instructions.  Will watch your condition.  Will get help right away if you are not doing well or get worse.   This information is not intended to replace advice given to you by your health care provider. Make sure you discuss any questions you have with your health care provider.   Document Released: 06/02/2011 Document Revised: 11/22/2013 Document Reviewed: 06/16/2013 Elsevier Interactive Patient Education [employer].

## 2015-10-12 NOTE — ED Notes (Signed)
Pt c/o cough x 1 month, SOB x 3-4 days, and fever x 2 days.  Denies pain.  Pt reports that she was seen at [facility] yesterday and had an O2 sat 85%.  Chest x-ray was negative.  Hx of asthma, PNA, and pulmonary fibrosis.  Pt is followed by Melvyn Novas MD w/ [facility].

## 2015-10-16 NOTE — Telephone Encounter (Signed)
This is an ER doctor, pt no longer in ER nor is DR Ayesha Rumpf

## 2015-10-16 NOTE — Telephone Encounter (Signed)
Dr. Wert please advise.  Thanks! 

## 2016-10-01 ENCOUNTER — Ambulatory Visit: Payer: Self-pay | Admitting: Internal Medicine

## 2017-05-08 ENCOUNTER — Ambulatory Visit
Admission: RE | Admit: 2017-05-08 | Discharge: 2017-05-08 | Disposition: A | Discharge status: Home / Self Care | Payer: Medicare Other | Source: Ambulatory Visit | Attending: *Deleted | Admitting: *Deleted

## 2017-05-08 ENCOUNTER — Other Ambulatory Visit: Payer: Self-pay | Admitting: *Deleted

## 2017-05-08 DIAGNOSIS — M549 Dorsalgia, unspecified: Secondary | ICD-10-CM

## 2018-09-30 ENCOUNTER — Other Ambulatory Visit: Payer: Self-pay | Admitting: Family Medicine

## 2018-09-30 DIAGNOSIS — Z1231 Encounter for screening mammogram for malignant neoplasm of breast: Secondary | ICD-10-CM

## 2019-08-03 NOTE — H&P (Signed)
I reviewed the patient's notes. She has chronic pain followed by Dr. Amalia Hailey in the past. Last time I saw her are prescribed oxybutynin rapaflo and desmopressin. She has been on chronic pain medicine in the past. She has been followed with fentanyl patch at a pain clinic. She has an InterStim. We talked about x-ray in trouble shoot in the past.   Patient has done very well with minimal pain. Has no nocturia. Now is voiding every 1 hour instead of 3 or 4 hours and she thinks the battery life is deteriorating in the device. She thinks is worsen in 2 months without other associated symptoms or infection. The battery she believes was changed in 2004   All 3 prescription renewed. I will set up an appointment with the [employer] representative to trouble shoot the device. we can also get a urine culture on that day   Today  Frequency is stable. No urine culture sent.  Patient underwent troubleshooting today. The battery life is so low it was going off and on and difficult to evaluate. The impedance in all 4 positions was abnormal but it could be a false-positive. She was also wondering about the new charge will device since she uses 1 for her chronic pain syndrome. She would need the entire lead changed if this was done.   I discussed the following. We would checked the impedance of the lead. If normal I would recommend to keep it in place because it could be helping her pain syndrome noted above and this is a tenuous situation. We would then changed the IPG. I would use my judgment if the impedances were abnormal we would change the entire device. Risk of retained tip discussed. Risk of ongoing or worsening symptoms especially pain discussed. We also discussed if she went to the full replacement with a charge a bowl the same risk applied.   The patient even with a diagnosis of interstitial cystitis she says she does not get pelvic pain. The pain stimulator she has is in the high left back. The IPG is in the  lower right buttock. She would rather have the new charge herbal and she thinks she can be compliant once a week. She did this also for travel and operative reasons with her mother aging. We will call her when the implant is available and schedule it.   Our [employer] representative will let me know when the device is available and the patient will be scheduled         ALLERGIES: Benadryl TABS Claritin TABS Doxycycline Hyclate CAPS    MEDICATIONS: Oxybutynin Chloride Er 15 mg tablet, extended release 24 hr 1 tablet PO Daily  Rapaflo 8 mg capsule 1 capsule PO Daily  Tamsulosin Hcl 0.4 mg capsule 1 capsule PO Daily  Buprenorphine 20 mcg/hour patch, transdermal weekly  Desmopressin Acetate 0.2 mg tablet 2 tablet PO Q HS  Doxepin Hcl 100 mg capsule  Levoxyl 50 mcg tablet Oral  Prilosec 20 mg capsule,delayed release Oral  Trintellix 10 mg tablet     GU PSH: None     PSH Notes: Knee Surgery, Bladder Surgery   NON-GU PSH: None   GU PMH: Interstitial Cystitis (w/o hematuria), Chronic interstitial cystitis without hematuria - 2017 Urinary Frequency, Increased urinary frequency - 2017 Weak Urinary Stream, Weak urinary stream - 2016 Nocturia, Nocturia - 2014 Urinary Urgency, Urinary urgency - 2014      PMH Notes:  2006-12-10 12:41:07 - Note: Anxiety  2006-12-10 12:41:07 - Note: Bipolar Disorder  NON-GU PMH: Encounter for general adult medical examination without abnormal findings, Encounter for preventive health examination - 2016 Asthma, Asthma - 2014 Personal history of diseases of the skin and subcutaneous tissue, History of psoriasis - 2014 Personal history of other diseases of the digestive system, History of esophageal reflux - 2014 Personal history of other endocrine, nutritional and metabolic disease, History of hypothyroidism - 2014 Personal history of other mental and behavioral disorders, History of depression - 2014 Seizure disorder, Convulsions (As Sx) - 2014     FAMILY HISTORY: None   SOCIAL HISTORY: Marital Status: Single Preferred Language: English; Ethnicity: Not Hispanic Or Latino; Race: White     Notes: Marital History - Single, Tobacco Use, Occupation:, Caffeine Use, Alcohol Use   REVIEW OF SYSTEMS:    GU Review Female:   Patient denies frequent urination, hard to postpone urination, burning /pain with urination, get up at night to urinate, leakage of urine, stream starts and stops, trouble starting your stream, have to strain to urinate, and being pregnant.  Gastrointestinal (Upper):   Patient denies nausea, vomiting, and indigestion/ heartburn.  Gastrointestinal (Lower):   Patient denies diarrhea and constipation.  Constitutional:   Patient denies fever, night sweats, weight loss, and fatigue.  Skin:   Patient denies skin rash/ lesion and itching.  Eyes:   Patient denies blurred vision and double vision.  Ears/ Nose/ Throat:   Patient denies sore throat and sinus problems.  Hematologic/Lymphatic:   Patient denies swollen glands and easy bruising.  Cardiovascular:   Patient denies leg swelling and chest pains.  Respiratory:   Patient denies cough and shortness of breath.  Endocrine:   Patient denies excessive thirst.  Musculoskeletal:   Patient denies back pain and joint pain.  Neurological:   Patient denies headaches and dizziness.  Psychologic:   Patient denies depression and anxiety.   VITAL SIGNS: None   PAST DATA REVIEWED:  Source Of History:  Patient   PROCEDURES:          Urinalysis Dipstick Dipstick Cont'd  Color: Yellow Bilirubin: Neg mg/dL  Appearance: Clear Ketones: Neg mg/dL  Specific Gravity: <=1.005 Blood: Neg ery/uL  pH: 6.5 Protein: Neg mg/dL  Glucose: Neg mg/dL Urobilinogen: 0.2 mg/dL    Nitrites: Neg    Leukocyte Esterase: Neg leu/uL    ASSESSMENT:      ICD-10 Details  1 GU:   Interstitial Cystitis (w/o hematuria) - N30.10   2   Urinary Frequency - R35.0      PLAN:           Schedule Return  Visit/Planned Activity: Return PRN - Office Visit   After a thorough review of the management options for the patient's condition the patient  elected to proceed with surgical therapy as noted above. We have discussed the potential benefits and risks of the procedure, side effects of the proposed treatment, the likelihood of the patient achieving the goals of the procedure, and any potential problems that might occur during the procedure or recuperation. Informed consent has been obtained.

## 2019-08-04 NOTE — Progress Notes (Signed)
Please add surgical orders. Pt is scheduled for her PAT appt tomorrow. Thank you.

## 2019-08-04 NOTE — Patient Instructions (Addendum)
DUE TO COVID-19 ONLY ONE VISITOR IS ALLOWED TO COME WITH YOU AND STAY IN THE WAITING ROOM ONLY DURING PRE OP AND PROCEDURE DAY OF SURGERY. THE 1 VISITOR MAY VISIT WITH YOU AFTER SURGERY IN YOUR PRIVATE ROOM DURING VISITING HOURS ONLY!  YOU NEED TO HAVE A COVID 19 TEST ON 08-09-19 @ 9:55 AM, THIS TEST MUST BE DONE BEFORE SURGERY, COME  [address], [place] , [id number].  ([place]) ONCE YOUR COVID TEST IS COMPLETED, PLEASE BEGIN THE QUARANTINE INSTRUCTIONS AS OUTLINED IN YOUR HANDOUT.                YARITZA DANNER  08/04/2019   Your procedure is scheduled on: 08-09-19    Report to [facility] Main  Entrance    Report to Admitting at 8:00 AM     Call this number if you have problems the morning of surgery [phone]    Remember: Do not eat food or drink liquids :After Midnight.    Take these medicines the morning of surgery with A SIP OF WATER: Aripiprazole (Abilify), Levothryoxine (Synthroid), Tamsulosin (Flomax),  and Vortioxetine  BRUSH YOUR TEETH MORNING OF SURGERY AND RINSE YOUR MOUTH OUT, NO CHEWING GUM CANDY OR MINTS.                                 You may not have any metal on your body including hair pins and              piercings     Do not wear jewelry, make-up, lotions, powders or perfumes, deodorant              Do not wear nail polish.  Do not shave  48 hours prior to surgery.                 Do not bring valuables to the hospital. [place].  Contacts, dentures or bridgework may not be worn into surgery.       Patients discharged the day of surgery will not be allowed to drive home. IF YOU ARE HAVING SURGERY AND GOING HOME THE SAME DAY, YOU MUST HAVE AN ADULT TO DRIVE YOU HOME AND BE WITH YOU FOR 24 HOURS. YOU MAY GO HOME BY TAXI OR UBER OR ORTHERWISE, BUT AN ADULT MUST ACCOMPANY YOU HOME AND STAY WITH YOU FOR 24 HOURS.  Name and phone number of your driver: Vear Mcaulay (Number  to be obtained)  Special Instructions: N/A              Please read over the following fact sheets you were given: _____________________________________________________________________             [facility] - Preparing for Surgery Before surgery, you can play an important role.  Because skin is not sterile, your skin needs to be as free of germs as possible.  You can reduce the number of germs on your skin by washing with CHG (chlorahexidine gluconate) soap before surgery.  CHG is an antiseptic cleaner which kills germs and bonds with the skin to continue killing germs even after washing. Please DO NOT use if you have an allergy to CHG or antibacterial soaps.  If your skin becomes reddened/irritated stop using the CHG and inform your nurse when you arrive at Short  Stay. Do not shave (including legs and underarms) for at least 48 hours prior to the first CHG shower.  You may shave your face/neck. Please follow these instructions carefully:  1.  Shower with CHG Soap the night before surgery and the  morning of Surgery.  2.  If you choose to wash your hair, wash your hair first as usual with your  normal  shampoo.  3.  After you shampoo, rinse your hair and body thoroughly to remove the  shampoo.                           4.  Use CHG as you would any other liquid soap.  You can apply chg directly  to the skin and wash                       Gently with a scrungie or clean washcloth.  5.  Apply the CHG Soap to your body ONLY FROM THE NECK DOWN.   Do not use on face/ open                           Wound or open sores. Avoid contact with eyes, ears mouth and genitals (private parts).                       Wash face,  Genitals (private parts) with your normal soap.             6.  Wash thoroughly, paying special attention to the area where your surgery  will be performed.  7.  Thoroughly rinse your body with warm water from the neck down.  8.  DO NOT shower/wash with your normal soap after using and  rinsing off  the CHG Soap.                9.  Pat yourself dry with a clean towel.            10.  Wear clean pajamas.            11.  Place clean sheets on your bed the night of your first shower and do not  sleep with pets. Day of Surgery : Do not apply any lotions/deodorants the morning of surgery.  Please wear clean clothes to the hospital/surgery center.  FAILURE TO FOLLOW THESE INSTRUCTIONS MAY RESULT IN THE CANCELLATION OF YOUR SURGERY PATIENT SIGNATURE_________________________________  NURSE SIGNATURE__________________________________  ________________________________________________________________________

## 2019-08-05 ENCOUNTER — Encounter (HOSPITAL_COMMUNITY)
Admission: RE | Admit: 2019-08-05 | Discharge: 2019-08-05 | Disposition: A | Discharge status: Home / Self Care | Payer: Medicare Other | Source: Ambulatory Visit | Attending: Family Medicine | Admitting: Family Medicine

## 2019-08-05 ENCOUNTER — Other Ambulatory Visit: Payer: Self-pay

## 2019-08-05 ENCOUNTER — Encounter (HOSPITAL_COMMUNITY): Payer: Self-pay

## 2019-08-05 ENCOUNTER — Other Ambulatory Visit (HOSPITAL_COMMUNITY)
Admission: RE | Admit: 2019-08-05 | Discharge: 2019-08-05 | Disposition: A | Discharge status: Home / Self Care | Payer: Medicare Other | Source: Ambulatory Visit | Attending: Urology | Admitting: Urology

## 2019-08-05 DIAGNOSIS — Z01812 Encounter for preprocedural laboratory examination: Secondary | ICD-10-CM | POA: Insufficient documentation

## 2019-08-05 DIAGNOSIS — Z20828 Contact with and (suspected) exposure to other viral communicable diseases: Secondary | ICD-10-CM | POA: Diagnosis not present

## 2019-08-05 LAB — CBC
HCT: 43.5 % (ref 36.0–46.0)
Hemoglobin: 14.8 g/dL (ref 12.0–15.0)
MCH: 30.3 pg (ref 26.0–34.0)
MCHC: 34 g/dL (ref 30.0–36.0)
MCV: 89.1 fL (ref 80.0–100.0)
Platelets: 210 10*3/uL (ref 150–400)
RBC: 4.88 MIL/uL (ref 3.87–5.11)
RDW: 12.9 % (ref 11.5–15.5)
WBC: 9.7 10*3/uL (ref 4.0–10.5)
nRBC: 0 % (ref 0.0–0.2)

## 2019-08-05 LAB — HCG, SERUM, QUALITATIVE: Preg, Serum: POSITIVE — AB

## 2019-08-05 LAB — BASIC METABOLIC PANEL
Anion gap: 8 (ref 5–15)
BUN: 13 mg/dL (ref 6–20)
CO2: 26 mmol/L (ref 22–32)
Calcium: 9.3 mg/dL (ref 8.9–10.3)
Chloride: 102 mmol/L (ref 98–111)
Creatinine, Ser: 0.69 mg/dL (ref 0.44–1.00)
GFR calc Af Amer: >60 mL/min (ref >60)
GFR calc non Af Amer: >60 mL/min (ref >60)
Glucose, Bld: 104 mg/dL — ABNORMAL HIGH (ref 70–99)
Potassium: 4.1 mmol/L (ref 3.5–5.1)
Sodium: 136 mmol/L (ref 135–145)

## 2019-08-06 LAB — NOVEL CORONAVIRUS, NAA (HOSP ORDER, SEND-OUT TO REF LAB; TAT 18-24 HRS): SARS-CoV-2, NAA: NOT DETECTED

## 2019-08-08 NOTE — Anesthesia Preprocedure Evaluation (Addendum)
Anesthesia Evaluation  Patient identified by MRN, date of birth, ID band Patient awake    Reviewed: Allergy & Precautions, NPO status , Patient's Chart, lab work & pertinent test results  Airway Mallampati: I  TM Distance: >3 FB Neck ROM: Full    Dental no notable dental hx. (+) Implants, Teeth Intact   Pulmonary asthma , Current Smoker,    Pulmonary exam normal breath sounds clear to auscultation       Cardiovascular Exercise Tolerance: Good Normal cardiovascular exam Rhythm:Regular Rate:Normal     Neuro/Psych PSYCHIATRIC DISORDERS Bipolar Disorder negative neurological ROS     GI/Hepatic Neg liver ROS, GERD  Medicated,  Endo/Other  Hypothyroidism   Renal/GU negative Renal ROS   unrinary incontinence    Musculoskeletal Hx of chronicpain   Abdominal   Peds  Hematology negative hematology ROS (+)   Anesthesia Other Findings All: see list  Reproductive/Obstetrics preg test result: weak positive  Pt denies that she could possibly be pregnant because she only has female partners.                          Anesthesia Physical Anesthesia Plan  ASA: II  Anesthesia Plan: General   Post-op Pain Management:    Induction: Intravenous  PONV Risk Score and Plan: 3 and Treatment may vary due to age or medical condition, Ondansetron, Dexamethasone and Midazolam  Airway Management Planned: Oral ETT  Additional Equipment: None  Intra-op Plan:   Post-operative Plan: Extubation in OR  Informed Consent: I have reviewed the patients History and Physical, chart, labs and discussed the procedure including the risks, benefits and alternatives for the proposed anesthesia with the patient or authorized representative who has indicated his/her understanding and acceptance.     Dental advisory given  Plan Discussed with:   Anesthesia Plan Comments: (Pt on buprenorphine.numerous allergies ? 0.3  mcg/kg Ketamine)       Anesthesia Quick Evaluation

## 2019-08-09 ENCOUNTER — Ambulatory Visit (HOSPITAL_COMMUNITY)
Admission: RE | Admit: 2019-08-09 | Discharge: 2019-08-09 | Disposition: A | Discharge status: Home / Self Care | Payer: Medicare Other | Attending: Urology | Admitting: Urology

## 2019-08-09 ENCOUNTER — Encounter (HOSPITAL_COMMUNITY)
Admission: RE | Disposition: A | Discharge status: Home / Self Care | Payer: Self-pay | Source: Home / Self Care | Attending: Urology

## 2019-08-09 ENCOUNTER — Encounter (HOSPITAL_COMMUNITY): Payer: Self-pay | Admitting: *Deleted

## 2019-08-09 ENCOUNTER — Ambulatory Visit (HOSPITAL_COMMUNITY): Payer: Medicare Other

## 2019-08-09 ENCOUNTER — Ambulatory Visit (HOSPITAL_COMMUNITY): Payer: Medicare Other | Admitting: Anesthesiology

## 2019-08-09 ENCOUNTER — Ambulatory Visit (HOSPITAL_COMMUNITY): Payer: Medicare Other | Admitting: Physician Assistant

## 2019-08-09 DIAGNOSIS — F319 Bipolar disorder, unspecified: Secondary | ICD-10-CM | POA: Diagnosis not present

## 2019-08-09 DIAGNOSIS — N3010 Interstitial cystitis (chronic) without hematuria: Secondary | ICD-10-CM | POA: Insufficient documentation

## 2019-08-09 DIAGNOSIS — N3941 Urge incontinence: Secondary | ICD-10-CM | POA: Insufficient documentation

## 2019-08-09 DIAGNOSIS — F419 Anxiety disorder, unspecified: Secondary | ICD-10-CM | POA: Diagnosis not present

## 2019-08-09 DIAGNOSIS — G894 Chronic pain syndrome: Secondary | ICD-10-CM | POA: Diagnosis not present

## 2019-08-09 DIAGNOSIS — K219 Gastro-esophageal reflux disease without esophagitis: Secondary | ICD-10-CM | POA: Diagnosis not present

## 2019-08-09 DIAGNOSIS — F17200 Nicotine dependence, unspecified, uncomplicated: Secondary | ICD-10-CM | POA: Insufficient documentation

## 2019-08-09 DIAGNOSIS — Y753 Surgical instruments, materials and neurological devices (including sutures) associated with adverse incidents: Secondary | ICD-10-CM | POA: Insufficient documentation

## 2019-08-09 DIAGNOSIS — Z888 Allergy status to other drugs, medicaments and biological substances status: Secondary | ICD-10-CM | POA: Diagnosis not present

## 2019-08-09 DIAGNOSIS — Z79890 Hormone replacement therapy (postmenopausal): Secondary | ICD-10-CM | POA: Diagnosis not present

## 2019-08-09 DIAGNOSIS — E039 Hypothyroidism, unspecified: Secondary | ICD-10-CM | POA: Insufficient documentation

## 2019-08-09 DIAGNOSIS — J45909 Unspecified asthma, uncomplicated: Secondary | ICD-10-CM | POA: Insufficient documentation

## 2019-08-09 DIAGNOSIS — T85113A Breakdown (mechanical) of implanted electronic neurostimulator, generator, initial encounter: Secondary | ICD-10-CM | POA: Diagnosis present

## 2019-08-09 DIAGNOSIS — Z79899 Other long term (current) drug therapy: Secondary | ICD-10-CM | POA: Diagnosis not present

## 2019-08-09 HISTORY — PX: INTERSTIM IMPLANT PLACEMENT: SHX5130

## 2019-08-09 SURGERY — INSERTION, SACRAL NERVE STIMULATOR, INTERSTIM, STAGE 2
Anesthesia: General

## 2019-08-09 MED ORDER — OXYCODONE HCL 5 MG/5ML PO SOLN: 5.0000 mg | Freq: Once | ORAL | Status: DC | PRN

## 2019-08-09 MED ORDER — ONDANSETRON HCL 4 MG/2ML IJ SOLN
INTRAMUSCULAR | Status: AC
  Filled 2019-08-09: qty 2

## 2019-08-09 MED ORDER — HYDROMORPHONE HCL 1 MG/ML IJ SOLN
0.2500 mg | INTRAMUSCULAR | Status: DC | PRN
  Administered 2019-08-09: 0.5 mg via INTRAVENOUS

## 2019-08-09 MED ORDER — BUPIVACAINE-EPINEPHRINE 0.5% -1:200000 IJ SOLN
INTRAMUSCULAR | Status: AC
  Filled 2019-08-09: qty 1

## 2019-08-09 MED ORDER — EPHEDRINE SULFATE-NACL 50-0.9 MG/10ML-% IV SOSY
PREFILLED_SYRINGE | INTRAVENOUS | Status: DC | PRN
  Administered 2019-08-09: 10 mg via INTRAVENOUS

## 2019-08-09 MED ORDER — PHENYLEPHRINE 40 MCG/ML (10ML) SYRINGE FOR IV PUSH (FOR BLOOD PRESSURE SUPPORT)
PREFILLED_SYRINGE | INTRAVENOUS | Status: AC
  Filled 2019-08-09: qty 10

## 2019-08-09 MED ORDER — KETOROLAC TROMETHAMINE 30 MG/ML IJ SOLN: 30.0000 mg | Freq: Once | INTRAMUSCULAR | Status: DC | PRN

## 2019-08-09 MED ORDER — GABAPENTIN 300 MG PO CAPS
300.0000 mg | ORAL_CAPSULE | Freq: Once | ORAL | Status: AC
  Administered 2019-08-09: 300 mg via ORAL
  Filled 2019-08-09: qty 1

## 2019-08-09 MED ORDER — LIDOCAINE-EPINEPHRINE (PF) 1 %-1:200000 IJ SOLN
INTRAMUSCULAR | Status: DC | PRN
  Administered 2019-08-09: 13 mL

## 2019-08-09 MED ORDER — LACTATED RINGERS IV SOLN
INTRAVENOUS | Status: DC
  Administered 2019-08-09 (×2): via INTRAVENOUS

## 2019-08-09 MED ORDER — OXYCODONE HCL 5 MG PO TABS: 5.0000 mg | ORAL_TABLET | Freq: Once | ORAL | Status: DC | PRN

## 2019-08-09 MED ORDER — ACETAMINOPHEN 500 MG PO TABS
1000.0000 mg | ORAL_TABLET | Freq: Once | ORAL | Status: AC
  Administered 2019-08-09: 08:00:00 1000 mg via ORAL
  Filled 2019-08-09: qty 2

## 2019-08-09 MED ORDER — PROPOFOL 10 MG/ML IV BOLUS
INTRAVENOUS | Status: AC
  Filled 2019-08-09: qty 20

## 2019-08-09 MED ORDER — GENTAMICIN SULFATE 40 MG/ML IJ SOLN
5.0000 mg/kg | Freq: Once | INTRAVENOUS | Status: AC
  Administered 2019-08-09: 280 mg via INTRAVENOUS
  Filled 2019-08-09: qty 7

## 2019-08-09 MED ORDER — PROPOFOL 10 MG/ML IV BOLUS
INTRAVENOUS | Status: DC | PRN
  Administered 2019-08-09: 100 mg via INTRAVENOUS

## 2019-08-09 MED ORDER — HYDROMORPHONE HCL 1 MG/ML IJ SOLN
INTRAMUSCULAR | Status: AC
  Filled 2019-08-09: qty 1

## 2019-08-09 MED ORDER — MIDAZOLAM HCL 2 MG/2ML IJ SOLN
INTRAMUSCULAR | Status: AC
  Filled 2019-08-09: qty 2

## 2019-08-09 MED ORDER — ONDANSETRON HCL 4 MG/2ML IJ SOLN
INTRAMUSCULAR | Status: DC | PRN
  Administered 2019-08-09: 4 mg via INTRAVENOUS

## 2019-08-09 MED ORDER — MIDAZOLAM HCL 5 MG/5ML IJ SOLN
INTRAMUSCULAR | Status: DC | PRN
  Administered 2019-08-09: 2 mg via INTRAVENOUS

## 2019-08-09 MED ORDER — SODIUM CHLORIDE 0.9 % IV SOLN
INTRAVENOUS | Status: DC | PRN
  Administered 2019-08-09: 40 ug/min via INTRAVENOUS

## 2019-08-09 MED ORDER — SUCCINYLCHOLINE CHLORIDE 20 MG/ML IJ SOLN
INTRAMUSCULAR | Status: DC | PRN
  Administered 2019-08-09: 60 mg via INTRAVENOUS

## 2019-08-09 MED ORDER — FENTANYL CITRATE (PF) 100 MCG/2ML IJ SOLN
INTRAMUSCULAR | Status: DC | PRN
  Administered 2019-08-09: 100 ug via INTRAVENOUS
  Administered 2019-08-09: 50 ug via INTRAVENOUS

## 2019-08-09 MED ORDER — STERILE WATER FOR IRRIGATION IR SOLN
Status: DC | PRN
  Administered 2019-08-09: 1000 mL

## 2019-08-09 MED ORDER — FENTANYL CITRATE (PF) 250 MCG/5ML IJ SOLN
INTRAMUSCULAR | Status: AC
  Filled 2019-08-09: qty 5

## 2019-08-09 MED ORDER — LIDOCAINE-EPINEPHRINE (PF) 1 %-1:200000 IJ SOLN
INTRAMUSCULAR | Status: AC
  Filled 2019-08-09: qty 30

## 2019-08-09 MED ORDER — MEPERIDINE HCL 50 MG/ML IJ SOLN: 6.2500 mg | INTRAMUSCULAR | Status: DC | PRN

## 2019-08-09 MED ORDER — VANCOMYCIN HCL IN DEXTROSE 1-5 GM/200ML-% IV SOLN
1000.0000 mg | Freq: Once | INTRAVENOUS | Status: AC
  Administered 2019-08-09: 10:00:00 1000 mg via INTRAVENOUS
  Filled 2019-08-09: qty 200

## 2019-08-09 MED ORDER — DEXAMETHASONE SODIUM PHOSPHATE 10 MG/ML IJ SOLN
INTRAMUSCULAR | Status: DC | PRN
  Administered 2019-08-09: 5 mg via INTRAVENOUS

## 2019-08-09 MED ORDER — PHENYLEPHRINE 40 MCG/ML (10ML) SYRINGE FOR IV PUSH (FOR BLOOD PRESSURE SUPPORT)
PREFILLED_SYRINGE | INTRAVENOUS | Status: DC | PRN
  Administered 2019-08-09: 120 ug via INTRAVENOUS
  Administered 2019-08-09 (×3): 80 ug via INTRAVENOUS
  Administered 2019-08-09: 120 ug via INTRAVENOUS
  Administered 2019-08-09 (×6): 80 ug via INTRAVENOUS
  Administered 2019-08-09: 120 ug via INTRAVENOUS
  Administered 2019-08-09: 80 ug via INTRAVENOUS

## 2019-08-09 MED ORDER — PHENYLEPHRINE 40 MCG/ML (10ML) SYRINGE FOR IV PUSH (FOR BLOOD PRESSURE SUPPORT)
PREFILLED_SYRINGE | INTRAVENOUS | Status: AC
  Filled 2019-08-09: qty 20

## 2019-08-09 MED ORDER — LIDOCAINE 2% (20 MG/ML) 5 ML SYRINGE
INTRAMUSCULAR | Status: DC | PRN
  Administered 2019-08-09: 60 mg via INTRAVENOUS

## 2019-08-09 MED ORDER — ONDANSETRON HCL 4 MG/2ML IJ SOLN
4.0000 mg | Freq: Once | INTRAMUSCULAR | Status: AC | PRN
  Administered 2019-08-09: 13:00:00 4 mg via INTRAVENOUS

## 2019-08-09 SURGICAL SUPPLY — 46 items
ADH SKN CLS APL DERMABOND .7 (GAUZE/BANDAGES/DRESSINGS) ×2
APL SKNCLS STERI-STRIP NONHPOA (GAUZE/BANDAGES/DRESSINGS)
BELT PT INTERSTIM MICRO SYSTEM (MISCELLANEOUS) ×2 IMPLANT
BELT SPRT LRG NS LF REUSE (MISCELLANEOUS) ×1
BENZOIN TINCTURE PRP APPL 2/3 (GAUZE/BANDAGES/DRESSINGS) IMPLANT
BLADE HEX COATED 2.75 (ELECTRODE) ×2 IMPLANT
BLADE SURG SZ10 CARB STEEL (BLADE) IMPLANT
COVER PROBE U/S 5X48 (MISCELLANEOUS) ×1 IMPLANT
COVER WAND RF STERILE (DRAPES) IMPLANT
DECANTER SPIKE VIAL GLASS SM (MISCELLANEOUS) IMPLANT
DERMABOND ADVANCED (GAUZE/BANDAGES/DRESSINGS) ×2
DERMABOND ADVANCED .7 DNX12 (GAUZE/BANDAGES/DRESSINGS) IMPLANT
DRAPE C-ARM 42X120 X-RAY (DRAPES) IMPLANT
DRAPE INCISE 23X17 IOBAN STRL (DRAPES) ×1
DRAPE INCISE 23X17 STRL (DRAPES) ×1 IMPLANT
DRAPE INCISE IOBAN 23X17 STRL (DRAPES) ×1 IMPLANT
DRAPE LAPAROSCOPIC ABDOMINAL (DRAPES) ×2 IMPLANT
DRAPE SHEET LG 3/4 BI-LAMINATE (DRAPES) IMPLANT
DRSG TEGADERM 4X4.75 (GAUZE/BANDAGES/DRESSINGS) ×4 IMPLANT
GAUZE 4X4 16PLY RFD (DISPOSABLE) ×2 IMPLANT
GAUZE SPONGE 4X4 12PLY STRL (GAUZE/BANDAGES/DRESSINGS) ×2 IMPLANT
GLOVE BIOGEL M STRL SZ7.5 (GLOVE) ×2 IMPLANT
GOWN STRL REUS W/TWL XL LVL3 (GOWN DISPOSABLE) ×2 IMPLANT
KIT BASIN OR (CUSTOM PROCEDURE TRAY) ×2 IMPLANT
KIT HANDSET INTERSTIM COMM (NEUROSIGN) ×2 IMPLANT
KIT RECHARGE INTERSTIM (NEUROSIGN) ×2 IMPLANT
KIT TURNOVER KIT A (KITS) IMPLANT
LEAD INTERSTIM 2.16 28 L (NEUROSIGN) ×2 IMPLANT
NDL HYPO 25X1 1.5 SAFETY (NEEDLE) IMPLANT
NEEDLE HYPO 25X1 1.5 SAFETY (NEEDLE) IMPLANT
NEUROSTIM INTERSTIM (Neurostimulator) ×1 IMPLANT
NEUROSTIMULATOR 1.7X2X.06 (UROLOGICAL SUPPLIES) ×1 IMPLANT
NEUROSTIMULATOR INTERSTIM (Neurostimulator) ×2 IMPLANT
NS IRRIG 1000ML POUR BTL (IV SOLUTION) IMPLANT
PACK GENERAL/GYN (CUSTOM PROCEDURE TRAY) ×2 IMPLANT
STAPLER VISISTAT 35W (STAPLE) IMPLANT
SUT SILK 2 0 (SUTURE) ×2
SUT SILK 2-0 30XBRD TIE 12 (SUTURE) ×1 IMPLANT
SUT VIC AB 3-0 SH 27 (SUTURE) ×8
SUT VIC AB 3-0 SH 27X BRD (SUTURE) ×2 IMPLANT
SUT VIC AB 4-0 PS2 27 (SUTURE) ×4 IMPLANT
SYR CONTROL 10ML LL (SYRINGE) IMPLANT
TRAY FOLEY MTR SLVR 16FR STAT (SET/KITS/TRAYS/PACK) IMPLANT
WATER STERILE IRR 1000ML POUR (IV SOLUTION) IMPLANT
kit handset interstim comm ×1 IMPLANT
kit recharge interstim ×1 IMPLANT

## 2019-08-09 NOTE — Anesthesia Postprocedure Evaluation (Signed)
Anesthesia Post Note  Patient: [name]  Procedure(s) Performed: REPLACE INTERSTIM IMPLANT SECOND STAGE IPG  LEAD REPLACEMENT AND IMPEDANCE  CHECK (N/A )     Patient location during evaluation: PACU Anesthesia Type: General Level of consciousness: awake and alert Pain management: pain level controlled Vital Signs Assessment: post-procedure vital signs reviewed and stable Respiratory status: spontaneous breathing, nonlabored ventilation, respiratory function stable and patient connected to nasal cannula oxygen Cardiovascular status: blood pressure returned to baseline and stable Postop Assessment: no apparent nausea or vomiting Anesthetic complications: no    Last Vitals:  Vitals:   08/09/19 1324 08/09/19 1330  BP: 110/66   Pulse: 78 84  Resp: 20 18  Temp:  36.5 C  SpO2: 98% 98%    Last Pain:  Vitals:   08/09/19 1330  TempSrc:   PainSc: 0-No pain                 Barnet Glasgow

## 2019-08-09 NOTE — Progress Notes (Signed)
Intended to repeat urine pregnancy pre-op however Pt adamantly denies possibility that she could be pregnant and does not want to repeat test.  Notified Drs Valma Cava and MacDiarmid and were okay with not repeating per the Patient's wishes.

## 2019-08-09 NOTE — Anesthesia Procedure Notes (Signed)
Procedure Name: Intubation Performed by: Gean Maidens, CRNA Pre-anesthesia Checklist: Patient identified, Emergency Drugs available, Suction available, Patient being monitored and Timeout performed Patient Re-evaluated:Patient Re-evaluated prior to induction Oxygen Delivery Method: Circle system utilized Preoxygenation: Pre-oxygenation with 100% oxygen Induction Type: IV induction Ventilation: Mask ventilation without difficulty Laryngoscope Size: Mac and 3 Grade View: Grade I Tube type: Oral Tube size: 7.0 mm Number of attempts: 1 Airway Equipment and Method: Stylet Placement Confirmation: ETT inserted through vocal cords under direct vision,  positive ETCO2 and breath sounds checked- equal and bilateral Secured at: 21 cm Tube secured with: Tape Dental Injury: Teeth and Oropharynx as per pre-operative assessment

## 2019-08-09 NOTE — Transfer of Care (Signed)
Immediate Anesthesia Transfer of Care Note  Patient: [name]  Procedure(s) Performed: REPLACE INTERSTIM IMPLANT SECOND STAGE IPG  LEAD REPLACEMENT AND IMPEDANCE  CHECK (N/A )  Patient Location: PACU  Anesthesia Type:General  Level of Consciousness: sedated, patient cooperative and responds to stimulation  Airway & Oxygen Therapy: Patient Spontanous Breathing and Patient connected to face mask oxygen  Post-op Assessment: Report given to RN and Post -op Vital signs reviewed and stable  Post vital signs: Reviewed and stable  Last Vitals:  Vitals Value Taken Time  BP 147/83 08/09/19 1231  Temp    Pulse 77 08/09/19 1233  Resp 13 08/09/19 1233  SpO2 100 % 08/09/19 1233  Vitals shown include unvalidated device data.  Last Pain:  Vitals:   08/09/19 0808  TempSrc: Oral         Complications: No apparent anesthesia complications

## 2019-08-09 NOTE — Op Note (Signed)
Preoperative diagnosis: Malfunctioning InterStim and refractory urgency incontinence Postoperative diagnosis: Malfunctioning InterStim and refractory urgency incontinence Surgery: Removal of malfunctioning InterStim and stage I and stage II of placement of InterStim and impedance check; rechargeable IPG utilized Surgeon: Dr. Nicki Reaper Keinan Brouillet  The patient has the above diagnosis and consented the above procedure.  Preoperative antibiotics were given.  She has a pain stimulator to the left of the midline.  Her the IPG from the InterStim is in the right high buttock but quite lateral and almost flipped over radiographically.  The lead is in the S3 foramina on her left side.  I initially marked out the position of the new IPG rechargeable device relative to her right upper buttock incision.  I instilled 10 cc of a lidocaine epinephrine mixture.  I dissected down to the appropriate depth through subcutaneous tissue.  I did not mobilize the pouch for the IPG yet. Eventual pocket was less than one inch  Utilizing the most lateral aspect of the incision and with appropriate pressure on the IPG the IPG pseudocapsule was opened and the IPG was removed.  Lead was cut with a hemostat applied  The lead was then identified by tugging on its lateral aspect and using fluoroscopy and seeing the previous incision.  I instilled approximately 8 cc of a lidocaine epinephrine mixture.  I made 2 centimeter incision.  I dissected down through subcutaneous tissue finding the lead easily and the lead was brought from lateral to medial.  I dissected down to the bony table and under lateral fluoroscopy remove the lead in total with a hemostat with my usual technique  3.5 inch foramen needle easily located the S3 foramina on left with excellent toe and Bellow response.  Guide was placed to appropriate depth.  White trocar was placed to appropriate depth.  The lead was then brought down to the appropriate depth.  This took  approximately 20 minutes to get the lead curving laterally.  In spite of several attempts it wanted to stay quite straight.  She would get excellent response at 2 and 3 but not 0 and 1.  I finally was able to get a very nice curve.  Radiographically the lead looked perfect in the medial aspect of S3 with a nice curving and no stacking.  There was minimal toe and Bellow response at position 0 and 1 but I really did not think changing it would make further improvement.  I did not think I needed to move it to the other side.  She had tremendous responses on 2 and 3.  The original IPG pocket was opened in its dependent aspect with cutting current.  I then closed off the pocket with 3-0 Vicryl.  A Babcock was placed on inferior level of the incision and I made a nice pocket the size of the new IPG.  Using the passer the lead was brought from medial to lateral.  It was connected to the new IPG with described technique and screwdriver.  It laid in very nicely into the pocket.  The pocket was once again closed with running 3-0 Vicryl keeping it separate from the original pocket.  It was also on a slight angle relative to the pocket making migration even less likely.  The skin was closed with 4-0 subcuticular.  The medial incision was closed with 3-0 Vicryl subcutaneous and 4-0 interrupted sutures.  Dermabond was applied with sterile dressing  X-rays were taken.  Lead is in excellent position.  Hopefully she will achieve excellent  results

## 2019-08-09 NOTE — Interval H&P Note (Signed)
History and Physical Interval Note:  08/09/2019 9:34 AM  [name]  has presented today for surgery, with the diagnosis of REFRACTORY URGENCY INCONTINENCE AND MALFUNCTIONING INTERSTIM.  The various methods of treatment have been discussed with the patient and family. After consideration of risks, benefits and other options for treatment, the patient has consented to  Procedure(s): REPLACE INTERSTIM IMPLANT SECOND STAGE IPG POSSIBLE LEAD REPLACEMENT AND IMPEDANCE  CHECK (N/A) as a surgical intervention.  The patient's history has been reviewed, patient examined, no change in status, stable for surgery.  I have reviewed the patient's chart and labs.  Questions were answered to the patient's satisfaction.     Elva Mauro A Claudetta Sallie

## 2019-08-09 NOTE — Progress Notes (Signed)
Dr Suzanne Boron Diarmid notified that narcotic sent to [facility] and unable to change to [facility] on [address].  Pt aware.

## 2019-08-09 NOTE — Discharge Instructions (Signed)
I have reviewed discharge instructions in detail with the patient. They will follow-up with me or their physician as scheduled. My nurse will also be calling the patients as per protocol.    General Anesthesia, Adult, Care After This sheet gives you information about how to care for yourself after your procedure. Your health care provider may also give you more specific instructions. If you have problems or questions, contact your health care provider. What can I expect after the procedure? After the procedure, the following side effects are common:  Pain or discomfort at the IV site.  Nausea.  Vomiting.  Sore throat.  Trouble concentrating.  Feeling cold or chills.  Weak or tired.  Sleepiness and fatigue.  Soreness and body aches. These side effects can affect parts of the body that were not involved in surgery. Follow these instructions at home:  For at least 24 hours after the procedure:  Have a responsible adult stay with you. It is important to have someone help care for you until you are awake and alert.  Rest as needed.  Do not: ? Participate in activities in which you could fall or become injured. ? Drive. ? Use heavy machinery. ? Drink alcohol. ? Take sleeping pills or medicines that cause drowsiness. ? Make important decisions or sign legal documents. ? Take care of children on your own. Eating and drinking  Follow any instructions from your health care provider about eating or drinking restrictions.  When you feel hungry, start by eating small amounts of foods that are soft and easy to digest (bland), such as toast. Gradually return to your regular diet.  Drink enough fluid to keep your urine pale yellow.  If you vomit, rehydrate by drinking water, juice, or clear broth. General instructions  If you have sleep apnea, surgery and certain medicines can increase your risk for breathing problems. Follow instructions from your health care provider about  wearing your sleep device: ? Anytime you are sleeping, including during daytime naps. ? While taking prescription pain medicines, sleeping medicines, or medicines that make you drowsy.  Return to your normal activities as told by your health care provider. Ask your health care provider what activities are safe for you.  Take over-the-counter and prescription medicines only as told by your health care provider.  If you smoke, do not smoke without supervision.  Keep all follow-up visits as told by your health care provider. This is important. Contact a health care provider if:  You have nausea or vomiting that does not get better with medicine.  You cannot eat or drink without vomiting.  You have pain that does not get better with medicine.  You are unable to pass urine.  You develop a skin rash.  You have a fever.  You have redness around your IV site that gets worse. Get help right away if:  You have difficulty breathing.  You have chest pain.  You have blood in your urine or stool, or you vomit blood. Summary  After the procedure, it is common to have a sore throat or nausea. It is also common to feel tired.  Have a responsible adult stay with you for the first 24 hours after general anesthesia. It is important to have someone help care for you until you are awake and alert.  When you feel hungry, start by eating small amounts of foods that are soft and easy to digest (bland), such as toast. Gradually return to your regular diet.  Drink enough fluid  to keep your urine pale yellow.  Return to your normal activities as told by your health care provider. Ask your health care provider what activities are safe for you. This information is not intended to replace advice given to you by your health care provider. Make sure you discuss any questions you have with your health care provider. Document Released: 02/23/2001 Document Revised: 11/20/2017 Document Reviewed:  07/03/2017 Elsevier Patient Education  2020 [employer].

## 2019-08-11 ENCOUNTER — Encounter (HOSPITAL_COMMUNITY): Payer: Self-pay | Admitting: Urology

## 2019-08-30 ENCOUNTER — Encounter: Payer: Self-pay | Admitting: Pulmonary Disease

## 2019-08-30 ENCOUNTER — Other Ambulatory Visit: Payer: Self-pay

## 2019-08-30 ENCOUNTER — Ambulatory Visit (INDEPENDENT_AMBULATORY_CARE_PROVIDER_SITE_OTHER): Payer: Medicare Other | Admitting: Pulmonary Disease

## 2019-08-30 ENCOUNTER — Institutional Professional Consult (permissible substitution): Payer: Medicare Other | Admitting: Internal Medicine

## 2019-08-30 VITALS — BP 112/70 | HR 92 | Temp 98.1°F | Ht <= 58 in | Wt 125.8 lb

## 2019-08-30 DIAGNOSIS — J849 Interstitial pulmonary disease, unspecified: Secondary | ICD-10-CM | POA: Diagnosis not present

## 2019-08-30 DIAGNOSIS — Z23 Encounter for immunization: Secondary | ICD-10-CM

## 2019-08-30 NOTE — Patient Instructions (Signed)
Pneumonia 23 shot today  Will schedule high resolution CT chest  Follow up in 2 weeks

## 2019-08-30 NOTE — Progress Notes (Signed)
  Subjective:     Patient ID: [name], female   DOB: 1971-11-03, 48 y.o.   MRN: [id number]  HPI   Review of Systems  Constitutional: Negative.   HENT: Negative.   Eyes: Negative.   Respiratory: Positive for cough.   Cardiovascular: Negative.   Gastrointestinal: Negative.   Endocrine: Positive for heat intolerance.  Genitourinary: Negative.   Musculoskeletal: Positive for back pain.  Skin: Negative.   Allergic/Immunologic: Positive for immunocompromised state.  Neurological: Negative.   Hematological: Negative.   Psychiatric/Behavioral: Negative.        Objective:   Physical Exam     Assessment:         Plan:

## 2019-08-30 NOTE — Progress Notes (Signed)
[facility]  Chief Complaint  Patient presents with  . Consult    Pulmonary Fibrosis last seen in 2016 by Dr. Melvyn Novas.    Constitutional:  BP 112/70 (BP Location: Right Arm, Patient Position: Sitting, Cuff Size: Normal)   Pulse 92   Temp 98.1 F (36.7 C)   Ht 4\' 9"  (1.448 m)   Wt 125 lb 12.8 oz (57.1 kg)   LMP 08/04/2016 (Approximate)   SpO2 97% Comment: on room air  BMI 27.22 kg/m   Past Medical History:  Seizures, Psoriasis, Hypothyroidism, Depression, Bipolar, Anxiety, GERD  Brief Summary:  [name] is a 48 y.o. 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  She was seen by Dr. Melvyn Novas last in 2016.  She had several episodes of pneumonia with concern for ILD.  PFT showed diffusion defect.  She does smoke cigarettes.    She has occasional cough with clear sputum.  Uses combivent prn and this helps.  She gets cough when she goes outside around grasses and pollen.  Doesn't feel like her breathing is a problem otherwise.  No having fever, sweats, weight loss, or hemoptysis.  No problems swallowing or with her vision.  Has history of psoriasis, but hasn't been an issue recently.  She worked in an office.  She is disabled due to Bipolar disease.  Still smokes 12 to 15 cigarettes per day.  Not interesting in quitting at this time.  Hasn't had pneumonia since she got prevar in 2016.  No history of TB.  Has pet cat and dog.  No family history of lung disease.   Physical Exam:   Appearance - well kempt   ENMT - clear nasal mucosa, midline nasal  septum, no oral exudates, no LAN, trachea midline  Respiratory - normal chest wall, normal respiratory effort, no accessory muscle use, no wheeze/rales  CV - s1s2 regular rate and rhythm, no murmurs, no peripheral edema, radial pulses symmetric  GI - soft, non tender, no masses  Lymph - no adenopathy noted in neck and axillary areas  MSK - normal gait  Ext - no cyanosis, clubbing, or joint inflammation noted  Skin - no rashes, lesions, or ulcers  Neuro - normal strength, oriented x 3  Psych - normal mood and affect  Discussion:  She continues to smoke cigarettes, reports history of asthma, and has previous diagnosis of ILD.  She doesn't have significant symptoms at present and clinical exam is benign.  Assessment/Plan:   Hx of ILD. - will arrange for HRCT chest - if this has significant abnormalities, would then need PFT and additional lab testing; she would otherwise like to avoid repeating PFT if possible  Tobacco abuse with history of asthma. - discussed options for smoking cessation - continue prn combivent  Vaccinations. - pneumovax booster today    Patient Instructions  Pneumonia 23 shot today  Will schedule high resolution CT chest  Follow up in 2 weeks    Chesley Mires, MD [facility] Pager: [phone] 08/30/2019, 11:40 AM  Flow Sheet     Pulmonary tests:  Serology 06/29/09 >> ACE negative, ANA 1:80 PFT 01/20/14 >> FEV1 1.89 (79%), FEV1% 92, TLC 3.35 (80%), DLCO 68% PFT 10/02/15 >> FEV1 1.75 (74%), FEV1% 91, TLC 3.23 (77%), DLCO 59%  Chest imaging:  CT angio chest 06/28/09 >> diffuse patchy GGO CT angio chest 07/02/14 >> patchy GGO b/l  Review of Systems:  Constitutional: Negative.   HENT: Negative.   Eyes: Negative.   Respiratory:  Positive for cough.   Cardiovascular: Negative.   Gastrointestinal: Negative.   Endocrine: Positive for heat intolerance.  Genitourinary: Negative.   Musculoskeletal: Positive for back pain.  Skin: Negative.   Allergic/Immunologic: Positive for immunocompromised state.  Neurological: Negative.   Hematological: Negative.   Psychiatric/Behavioral: Negative.    Medications:   Allergies as of 08/30/2019      Reactions   Diphenhydramine Hives, Itching   Silicone Itching, Rash   Zyrtec [cetirizine Hcl] Shortness Of Breath   Throat swelling   Allegra [fexofenadine] Other (See Comments), Swelling    Facial and head swelling.   Claritin [loratadine] Swelling   Facial swelling   Clindamycin/lincomycin Hives, Rash   Benadryl [diphenhydramine Hcl] Hives   Chantix [varenicline]    Agitated, anxious   Propranolol Other (See Comments)   Confusion, vision changes, disorientation   Lamictal [lamotrigine] Rash      Medication List       Accurate as of August 30, 2019 11:40 AM. If you have any questions, ask your nurse or doctor.        STOP taking these medications   esomeprazole 40 MG capsule Commonly known as: Lavon Stopped by: Chesley Mires, MD   fentaNYL 25 MCG/HR Commonly known as: DURAGESIC Stopped by: Chesley Mires, MD   Ingrezza 40 MG Caps Generic drug: Administrator Stopped by: Chesley Mires, MD   Levomilnacipran HCl ER 80 MG Cp24 Commonly known as: Fetzima Stopped by: Chesley Mires, MD   meloxicam 15 MG tablet Commonly known as: MOBIC Stopped by: Chesley Mires, MD   predniSONE 10 MG tablet Commonly known as: DELTASONE Stopped by: Chesley Mires, MD   QUEtiapine 50 MG tablet Commonly known as: SEROQUEL Stopped by: Chesley Mires, MD     TAKE these medications   ARIPiprazole 5 MG tablet Commonly known as: ABILIFY Take 5 mg by mouth daily.   desmopressin 0.2 MG tablet Commonly known as: DDAVP Take 2 tablets (0.4 mg total) by mouth at bedtime.   diclofenac sodium 1 % Gel Commonly known as: VOLTAREN Apply 1 application topically once a week.   doxepin 75 MG capsule Commonly known as: SINEQUAN Take 75 mg by mouth 2 (two) times daily.   Ipratropium-Albuterol 20-100 MCG/ACT Aers respimat Commonly known as: Combivent Respimat Inhale 1 puff into the lungs every 6 (six) hours. One puff up to 4x daily   levofloxacin 750 MG tablet Commonly known as: LEVAQUIN Take 1 tablet (750 mg total) by mouth daily.   levothyroxine 50 MCG tablet Commonly known as: SYNTHROID Take 1 tablet (50 mcg total) by mouth daily.   Nucynta 50 MG tablet Generic drug: tapentadol  Take 50 mg by mouth every 6 (six) hours as needed for moderate pain.   omeprazole 40 MG capsule Commonly known as: PRILOSEC Take 40 mg by mouth at bedtime.   oxybutynin 15 MG 24 hr tablet Commonly known as: DITROPAN XL Take 1 tablet (15 mg total) by mouth daily. What changed: when to take this   tamsulosin 0.4 MG Caps capsule Commonly known as: FLOMAX Take 0.4 mg by mouth daily.   Trintellix 20 MG Tabs tablet Generic drug: vortioxetine HBr Take 20 mg by mouth daily.   ziprasidone 80 MG capsule Commonly known as: GEODON Take 160 capsules by mouth at bedtime.       Past Surgical History:  She  has a past surgical history that includes Total knee arthroplasty; bladder pacemaker; Wisdom tooth extraction; Dilatation & curettage/hysteroscopy with myosure (N/A, 12/26/2014); Dilatation & currettage/hysteroscopy with  hydrothermal ablation (N/A, 12/26/2014); and Interstim Implant placement (N/A, 08/09/2019).  Family History:  Her family history includes Alcoholism in her sister; Dementia in her mother; Diabetes type II in her sister.  Social History:  She  reports that she has been smoking cigarettes. She has a 7.25 pack-year smoking history. She has never used smokeless tobacco. She reports that she does not drink alcohol or use drugs.

## 2019-08-31 NOTE — Addendum Note (Signed)
Addended by: Nena Polio on: 08/31/2019 04:35 PM   Modules accepted: Orders

## 2019-09-06 ENCOUNTER — Telehealth: Payer: Self-pay | Admitting: Pulmonary Disease

## 2019-09-06 NOTE — Telephone Encounter (Signed)
Erline Levine called back, states that they would not want to complete the HRCT during the first trimester if the patient is in fact pregnant.  Erline Levine states that we can send her a staff message with the provider's response.  Please advise, thanks!

## 2019-09-06 NOTE — Telephone Encounter (Signed)
lmtcb for [facility] at [facility]. Per chart, pt had a "weak positive HCG urine level on 08/05/2019 during pre-admission testing for her 9/8 sx.  Pt declined to repeat test on 08/09/2019 and surgeons were ok with not repeating test per pt's wishes (see progress note from 08/09/2019.)  Pt has a HRCT scheduled on 09/08/2019.   Routing to APP of the day as VS is working in hospital this week.  Tammy please advise on how to proceed.  Thanks!

## 2019-09-06 NOTE — Telephone Encounter (Signed)
Will need repeat pregnancy test to confirm neg pregnancy unless has had a hysterectomy .

## 2019-09-06 NOTE — Telephone Encounter (Signed)
Spoke with [name] and notified of recs per TP  She verbalized understanding   ATC the pt and advise her needs to come get pregnancy test unless has had hysterectomy- Line busy [facility]

## 2019-09-07 NOTE — Telephone Encounter (Signed)
Serum pregnancy (blood)

## 2019-09-07 NOTE — Telephone Encounter (Signed)
I mean its required for the CT scan. I did not order this. She needs to have it done from my understanding

## 2019-09-07 NOTE — Telephone Encounter (Signed)
ATC x 2 and her line is still busy

## 2019-09-07 NOTE — Telephone Encounter (Signed)
Beth-before I attempt to reach this pt again- what type of lab test do you want to order? Blood or urine? Needs to be done today or stat early tomorrow as her CT is scheduled for 11:15 09/08/19  Please advise thanks!

## 2019-09-07 NOTE — Telephone Encounter (Signed)
Patient states "she is gay there is not way she is pregnant"

## 2019-09-07 NOTE — Telephone Encounter (Signed)
Spoke with Stacy in [place]. States that she spoke with the pt and the pt told her, "I have slept with a man in over 30 years, there is no way on God's [place] I am pregnant." Marzetta Board states that they are going to go ahead and scan the pt since they aren't scanning the pt's abdomen. Nothing further was needed at this time.

## 2019-09-07 NOTE — Telephone Encounter (Signed)
ATC pt, line rang busy.

## 2019-09-08 ENCOUNTER — Other Ambulatory Visit: Payer: Self-pay

## 2019-09-08 ENCOUNTER — Ambulatory Visit (INDEPENDENT_AMBULATORY_CARE_PROVIDER_SITE_OTHER)
Admission: RE | Admit: 2019-09-08 | Discharge: 2019-09-08 | Disposition: A | Discharge status: Home / Self Care | Payer: Medicare Other | Source: Ambulatory Visit | Attending: Pulmonary Disease | Admitting: Pulmonary Disease

## 2019-09-08 ENCOUNTER — Encounter (INDEPENDENT_AMBULATORY_CARE_PROVIDER_SITE_OTHER): Payer: Self-pay

## 2019-09-08 DIAGNOSIS — J849 Interstitial pulmonary disease, unspecified: Secondary | ICD-10-CM

## 2019-09-15 ENCOUNTER — Ambulatory Visit (INDEPENDENT_AMBULATORY_CARE_PROVIDER_SITE_OTHER): Payer: Medicare Other | Admitting: Pulmonary Disease

## 2019-09-15 ENCOUNTER — Other Ambulatory Visit: Payer: Self-pay

## 2019-09-15 ENCOUNTER — Encounter: Payer: Self-pay | Admitting: Pulmonary Disease

## 2019-09-15 VITALS — BP 116/68 | HR 96 | Temp 97.8°F | Ht 59.0 in | Wt 127.4 lb

## 2019-09-15 DIAGNOSIS — Z720 Tobacco use: Secondary | ICD-10-CM

## 2019-09-15 DIAGNOSIS — R918 Other nonspecific abnormal finding of lung field: Secondary | ICD-10-CM | POA: Diagnosis not present

## 2019-09-15 DIAGNOSIS — R911 Solitary pulmonary nodule: Secondary | ICD-10-CM

## 2019-09-15 NOTE — Progress Notes (Signed)
[facility]  Chief Complaint  Patient presents with  . Results    Discuss results of high resolution chest CT.    Constitutional:  BP 116/68 (BP Location: Left Arm, Patient Position: Sitting, Cuff Size: Normal)   Pulse 96   Temp 97.8 F (36.6 C)   Ht 4\' 11"  (1.499 m)   Wt 127 lb 6.4 oz (57.8 kg)   LMP 08/04/2016 (Approximate)   SpO2 99% Comment: on room air  BMI 25.73 kg/m   Past Medical History:  Seizures, Psoriasis, Hypothyroidism, Depression, Bipolar, Anxiety, GERD  Brief Summary:  [name] is a 48 y.o. female smoker with cough and lung nodule.  She had CT chest.  Reviewed by me.  No ILD. Had lung nodules Rt upper lobe.  She continues to smoke.  Can't use medications due to bipolar.  Wants to try hypnosis, but can't afford it.  Tried E cigarette with flavoring, but then got pneumonia years ago.    Not having cough, wheeze, sputum.  Hasn't needed combivent recently.    Physical Exam:   Appearance - well kempt   ENMT - no sinus tenderness, no nasal discharge, no oral exudate  Neck - no masses, trachea midline, no thyromegaly, no elevation in JVP  Respiratory - normal appearance of chest wall, normal respiratory effort w/o accessory muscle use, no dullness on percussion, no wheezing or rales  CV - s1s2 regular rate and rhythm, no murmurs, no peripheral edema, radial pulses symmetric  GI - soft, non tender  Lymph - no adenopathy noted in neck and axillary areas  MSK - normal gait  Ext - no cyanosis, clubbing, or joint inflammation noted  Skin - no rashes, lesions, or ulcers  Neuro - normal strength, oriented x 3  Psych - normal mood and affect   Discussion:  No evidence of ILD.  She does continue to smoke, has asthma, and is at risk for developing COPD.  Also found to have lung nodule.  Assessment/Plan:   Tobacco abuse with history of asthma. - discussed options to help with smoking cessation - she  will try e cigarette w/o flavoring additive - prn combivent - she declined option to repeat PFT at this time  Lung nodule. - will need CT chest w/o contrast in October 2021   Patient Instructions  Can try electronic cigarette to help quit smoking  Will schedule CT chest for October 2021  Follow up in 1 year   Chesley Mires, MD [facility] Pager: [phone] 09/15/2019, 10:48 AM  Flow Sheet     Pulmonary tests:  Serology 06/29/09 >> ACE negative, ANA 1:80 PFT 01/20/14 >> FEV1 1.89 (79%), FEV1% 92, TLC 3.35 (80%), DLCO 68% PFT 10/02/15 >> FEV1 1.75 (74%), FEV1% 91, TLC 3.23 (77%), DLCO 59%  Chest imaging:  CT angio chest 06/28/09 >> diffuse patchy GGO CT angio chest 07/02/14 >> patchy GGO b/. HRCT chest 09/08/19 >> small nodules RUL, fatty liver  Medications:   Allergies as of 09/15/2019      Reactions   Diphenhydramine Hives, Itching   Silicone Itching, Rash   Develops what looks like a 3rd degree burn.   Zyrtec [cetirizine Hcl] Shortness Of Breath   Throat swelling   Allegra [fexofenadine] Other (See Comments), Swelling   Facial and head swelling.   Claritin [loratadine] Swelling   Facial swelling   Clindamycin/lincomycin Hives, Rash   Benadryl [diphenhydramine Hcl] Hives   Chantix [varenicline]    Agitated, anxious  Propranolol Other (See Comments)   Confusion, vision changes, disorientation   Lamictal [lamotrigine] Rash      Medication List       Accurate as of September 15, 2019 10:48 AM. If you have any questions, ask your nurse or doctor.        STOP taking these medications   levofloxacin 750 MG tablet Commonly known as: LEVAQUIN Stopped by: Chesley Mires, MD     TAKE these medications   ARIPiprazole 5 MG tablet Commonly known as: ABILIFY Take 5 mg by mouth daily.   desmopressin 0.2 MG tablet Commonly known as: DDAVP Take 2 tablets (0.4 mg total) by mouth at bedtime.   diclofenac sodium 1 % Gel Commonly known as:  VOLTAREN Apply 1 application topically once a week.   doxepin 75 MG capsule Commonly known as: SINEQUAN Take 75 mg by mouth 2 (two) times daily.   Ipratropium-Albuterol 20-100 MCG/ACT Aers respimat Commonly known as: Combivent Respimat Inhale 1 puff into the lungs every 6 (six) hours. One puff up to 4x daily   levothyroxine 50 MCG tablet Commonly known as: SYNTHROID Take 1 tablet (50 mcg total) by mouth daily.   Nucynta 50 MG tablet Generic drug: tapentadol Take 50 mg by mouth every 6 (six) hours as needed for moderate pain.   omeprazole 40 MG capsule Commonly known as: PRILOSEC Take 40 mg by mouth at bedtime.   oxybutynin 15 MG 24 hr tablet Commonly known as: DITROPAN XL Take 1 tablet (15 mg total) by mouth daily. What changed: when to take this   tamsulosin 0.4 MG Caps capsule Commonly known as: FLOMAX Take 0.4 mg by mouth daily.   Trintellix 20 MG Tabs tablet Generic drug: vortioxetine HBr Take 20 mg by mouth daily.   ziprasidone 80 MG capsule Commonly known as: GEODON Take 160 capsules by mouth at bedtime.       Past Surgical History:  She  has a past surgical history that includes Total knee arthroplasty; bladder pacemaker; Wisdom tooth extraction; Dilatation & curettage/hysteroscopy with myosure (N/A, 12/26/2014); Dilatation & currettage/hysteroscopy with hydrothermal ablation (N/A, 12/26/2014); and Interstim Implant placement (N/A, 08/09/2019).  Family History:  Her family history includes Alcoholism in her sister; Dementia in her mother; Diabetes type II in her sister.  Social History:  She  reports that she has been smoking cigarettes. She has a 7.25 pack-year smoking history. She has never used smokeless tobacco. She reports that she does not drink alcohol or use drugs.

## 2019-09-15 NOTE — Patient Instructions (Signed)
Can try electronic cigarette to help quit smoking  Will schedule CT chest for October 2021  Follow up in 1 year

## 2020-02-15 ENCOUNTER — Other Ambulatory Visit: Payer: Self-pay | Admitting: Urology

## 2020-02-16 NOTE — Progress Notes (Signed)
DUE TO COVID-19 ONLY ONE VISITOR IS ALLOWED TO COME WITH YOU AND STAY IN THE WAITING ROOM ONLY DURING PRE OP AND PROCEDURE DAY OF SURGERY. THE 1 VISITOR MAY VISIT WITH YOU AFTER SURGERY IN YOUR PRIVATE ROOM DURING VISITING HOURS ONLY!  YOU NEED TO HAVE A COVID 19 TEST ON___3/19/21 1315pm____ @_______ , THIS TEST MUST BE DONE BEFORE SURGERY, COME  [name] , [id number].  ([place]) ONCE YOUR COVID TEST IS COMPLETED, PLEASE BEGIN THE QUARANTINE INSTRUCTIONS AS OUTLINED IN YOUR HANDOUT.                [name]  02/16/2020   Your procedure is scheduled on:  02/21/2020   Report to [facility] Main  Entrance   Report to admitting at    0800 AM     Call this number if you have problems the morning of surgery [phone]    Remember: Do not eat food or drink liquids :After Midnight. BRUSH YOUR TEETH MORNING OF SURGERY AND RINSE YOUR MOUTH OUT, NO CHEWING GUM CANDY OR MINTS.     Take these medicines the morning of surgery with A SIP OF WATER:  Abilify, Inhalers as usual and bring, Synthroid, Bactrim, Flomax ( if takes in am), Trintellix                                 You may not have any metal on your body including hair pins and              piercings  Do not wear jewelry, make-up, lotions, powders or perfumes, deodorant             Do not wear nail polish on your fingernails.  Do not shave  48 hours prior to surgery.                 Do not bring valuables to the hospital. [place].  Contacts, dentures or bridgework may not be worn into surgery.  Leave suitcase in the car. After surgery it may be brought to your room.     Patients discharged the day of surgery will not be allowed to drive home. IF YOU ARE HAVING SURGERY AND GOING HOME THE SAME DAY, YOU MUST HAVE AN ADULT TO DRIVE YOU HOME AND BE WITH YOU FOR 24 HOURS. YOU MAY GO HOME BY TAXI OR UBER OR ORTHERWISE, BUT AN ADULT MUST  ACCOMPANY YOU HOME AND STAY WITH YOU FOR 24 HOURS.  Name and phone number of your driver:                Please read over the following fact sheets you were given: _____________________________________________________________________             [facility] - Preparing for Surgery Before surgery, you can play an important role.  Because skin is not sterile, your skin needs to be as free of germs as possible.  You can reduce the number of germs on your skin by washing with CHG (chlorahexidine gluconate) soap before surgery.  CHG is an antiseptic cleaner which kills germs and bonds with the skin to continue killing germs even after washing. Please DO NOT use if you have an allergy to CHG or antibacterial soaps.  If your skin becomes reddened/irritated stop using  the CHG and inform your nurse when you arrive at Short Stay. Do not shave (including legs and underarms) for at least 48 hours prior to the first CHG shower.  You may shave your face/neck. Please follow these instructions carefully:  1.  Shower with CHG Soap the night before surgery and the  morning of Surgery.  2.  If you choose to wash your hair, wash your hair first as usual with your  normal  shampoo.  3.  After you shampoo, rinse your hair and body thoroughly to remove the  shampoo.                           4.  Use CHG as you would any other liquid soap.  You can apply chg directly  to the skin and wash                       Gently with a scrungie or clean washcloth.  5.  Apply the CHG Soap to your body ONLY FROM THE NECK DOWN.   Do not use on face/ open                           Wound or open sores. Avoid contact with eyes, ears mouth and genitals (private parts).                       Wash face,  Genitals (private parts) with your normal soap.             6.  Wash thoroughly, paying special attention to the area where your surgery  will be performed.  7.  Thoroughly rinse your body with warm water from the neck down.  8.  DO NOT  shower/wash with your normal soap after using and rinsing off  the CHG Soap.                9.  Pat yourself dry with a clean towel.            10.  Wear clean pajamas.            11.  Place clean sheets on your bed the night of your first shower and do not  sleep with pets. Day of Surgery : Do not apply any lotions/deodorants the morning of surgery.  Please wear clean clothes to the hospital/surgery center.  FAILURE TO FOLLOW THESE INSTRUCTIONS MAY RESULT IN THE CANCELLATION OF YOUR SURGERY PATIENT SIGNATURE_________________________________  NURSE SIGNATURE__________________________________  ________________________________________________________________________

## 2020-02-16 NOTE — Progress Notes (Signed)
Need orders in epic for surgery on 02/21/20.  Preop phone call on 02/17/20.  Thanks.

## 2020-02-17 ENCOUNTER — Encounter (HOSPITAL_COMMUNITY): Payer: Self-pay

## 2020-02-17 ENCOUNTER — Other Ambulatory Visit (HOSPITAL_COMMUNITY)
Admission: RE | Admit: 2020-02-17 | Discharge: 2020-02-17 | Disposition: A | Discharge status: Home / Self Care | Payer: Medicare Other | Source: Ambulatory Visit | Attending: Urology | Admitting: Urology

## 2020-02-17 ENCOUNTER — Other Ambulatory Visit (HOSPITAL_COMMUNITY): Payer: Medicare Other

## 2020-02-17 ENCOUNTER — Other Ambulatory Visit: Payer: Self-pay

## 2020-02-17 ENCOUNTER — Encounter (HOSPITAL_COMMUNITY)
Admission: RE | Admit: 2020-02-17 | Discharge: 2020-02-17 | Disposition: A | Discharge status: Home / Self Care | Payer: Medicare Other | Source: Ambulatory Visit | Attending: Urology | Admitting: Urology

## 2020-02-17 DIAGNOSIS — Z01812 Encounter for preprocedural laboratory examination: Secondary | ICD-10-CM | POA: Diagnosis present

## 2020-02-17 DIAGNOSIS — Z20822 Contact with and (suspected) exposure to covid-19: Secondary | ICD-10-CM | POA: Diagnosis not present

## 2020-02-17 LAB — SARS CORONAVIRUS 2 (TAT 6-24 HRS): SARS Coronavirus 2: NEGATIVE

## 2020-02-19 NOTE — H&P (Signed)
Patient has interstitial cystitis. Reviewed no November 09, 2019. September 8th had full replacement. She had little bit of drainage from her incision last time and we had some dictation issues. I gave her Keflex for a week. Her pain and frequency was very well controlled at that time.   Patient feels fine. Frequency a discomfort well control. She always has minimal discomfort. She said there is a scab on her back but no drainage   Her right buttock incision looked normal though the lateral edge he was a little bit elevated but I thought within normal limits. The midline there was a superficial boil that when I touched and it drained I cultured it. I expressed a little bit of pus. It was approximately 4 mm in depth and approximately 6 mm x 2 mm in size. Skin around it was minimally red. Was nontender. Sterile dressing was applied. She will use a banding system at home. I will call in Bactrim for 10 days and 1 see her in 2 weeks   Today  Culture came back Staphylococcus sensitive to Bactrim.  Frequency and discomfort stable. The device has been working well  Patient said that she felt good until Wednesday when she had 1 little weight area pus drainage from the midline incision. Her lower buttock is sore and a bit uncomfortable to set   On examination right buttock incision is normal. Again the right lateral edge is a little bit elevated but not fluctuant. She I was able to express the smallest dot of white fluid from the midline dry incision and sent for culture. She was not tender and there is no cellulitis. No bony or sacral tenderness   Patient understands that the device could be infected. I hate to take it out until things are more obvious. I do not think she is having infection involving bony structures. We discussed this rare risk. We discussed removing the device 1st another course of antibiotics recognizing his limitation.   I will call in the Bactrim again for 10 days and see in 2 weeks. If  her buttock pain worsens which was hard for her to define or locate she will let me know. It was several cm below the midline buttock incision. She ambulates well. We talked about removing the device but we will work to this together and hopefully things settle down but otherwise she may need explantation.   Today  Last culture Staphylococcus.  Within 2 days of taking antibiotics he was not have any pain. She has had a dried up and now it is just today starting to lose again.  Midline incision she has a little bit of draining pustule.  We talked about removing the device and I think we need to move on and she agreed. Retained tip that was infected and serious sequelae discussed but this would be very rare. We would wait several weeks and reconsider another implant to the helps her symptoms so greatly.  Based on culture I gave her gentamicin prior to surgery. I will also give her 10 days of Bactrim again to make sure infection does not worsen in the interim and she will have a few days after surgery. She was very understanding hopefully we can replace this within approximately 6 weeks but we did talk about surgical guideline principles today     ALLERGIES: Benadryl TABS Claritin TABS Doxycycline Hyclate CAPS    MEDICATIONS: Oxybutynin Chloride Er 15 mg tablet, extended release 24 hr 1 tablet PO Daily  Tamsulosin  Hcl 0.4 mg capsule 1 capsule PO Daily  Abilify 5 mg tablet  Desmopressin Acetate 0.2 mg tablet 2 tablet PO Q HS  Doxepin Hcl 100 mg capsule  Geodon  Levoxyl 50 mcg tablet Oral  Nucynta  Prilosec 20 mg capsule,delayed release Oral  Trintellix 10 mg tablet     GU PSH: Interstim Stage 2 Generator - 08/09/2019 Interstim Stage One Neurostimulator - 08/09/2019       PSH Notes: Knee Surgery, Bladder Surgery   NON-GU PSH: No Non-GU PSH    GU PMH: Interstitial Cystitis (w/o hematuria), Chronic interstitial cystitis without hematuria - 2017 Urinary Frequency, Increased urinary frequency  - 2017 Weak Urinary Stream, Weak urinary stream - 2016 Nocturia, Nocturia - 2014 Urinary Urgency, Urinary urgency - 2014      PMH Notes:  2006-12-10 12:41:07 - Note: Anxiety  2006-12-10 12:41:07 - Note: Bipolar Disorder   NON-GU PMH: Encounter for general adult medical examination without abnormal findings, Encounter for preventive health examination - 2016 Asthma, Asthma - 2014 Personal history of diseases of the skin and subcutaneous tissue, History of psoriasis - 2014 Personal history of other diseases of the digestive system, History of esophageal reflux - 2014 Personal history of other endocrine, nutritional and metabolic disease, History of hypothyroidism - 2014 Personal history of other mental and behavioral disorders, History of depression - 2014 Seizure disorder, Convulsions (As Sx) - 2014    FAMILY HISTORY: No Family History    SOCIAL HISTORY: Marital Status: Single Preferred Language: English; Ethnicity: Not Hispanic Or Latino; Race: White     Notes: Marital History - Single, Tobacco Use, Occupation:, Caffeine Use, Alcohol Use   REVIEW OF SYSTEMS:    GU Review Female:   Patient denies frequent urination, hard to postpone urination, burning /pain with urination, get up at night to urinate, leakage of urine, stream starts and stops, trouble starting your stream, have to strain to urinate, and being pregnant.  Gastrointestinal (Upper):   Patient denies nausea, vomiting, and indigestion/ heartburn.  Gastrointestinal (Lower):   Patient denies diarrhea and constipation.  Constitutional:   Patient denies fever, night sweats, weight loss, and fatigue.  Skin:   Patient denies skin rash/ lesion and itching.  Eyes:   Patient denies blurred vision and double vision.  Ears/ Nose/ Throat:   Patient denies sore throat and sinus problems.  Hematologic/Lymphatic:   Patient denies swollen glands and easy bruising.  Cardiovascular:   Patient denies leg swelling and chest pains.   Respiratory:   Patient denies cough and shortness of breath.  Endocrine:   Patient denies excessive thirst.  Musculoskeletal:   Patient denies back pain and joint pain.  Neurological:   Patient denies headaches and dizziness.  Psychologic:   Patient denies depression and anxiety.   Notes: fu    VITAL SIGNS:      02/15/2020 09:24 AM  Weight 127.0 lb / 57.61 kg  Height 58 in / 147.32 cm  BP 124/77 mmHg  Pulse 91 /min  Temperature 97.5 F / 36.3 C  BMI 26.5 kg/m   PAST DATA REVIEWED:  Source Of History:  Patient   PROCEDURES:          Urinalysis w/Scope - 81001 Dipstick Dipstick Cont'd Micro  Specimen: Voided Bilirubin: Neg WBC/hpf: 0 - 5/hpf  Color: Yellow Ketones: Neg RBC/hpf: 0 - 2/hpf  Appearance: Clear Blood: Neg Bacteria: NS (Not Seen)  Specific Gravity: 1.025 Protein: Neg Cystals: NS (Not Seen)  pH: 6.0 Urobilinogen: 0.2 Casts: NS (Not Seen)  Glucose: Neg Nitrites: Neg Trichomonas: Not Present    Leukocyte Esterase: Trace Mucous: Not Present      Epithelial Cells: 0 - 5/hpf      Yeast: NS (Not Seen)      Sperm: Not Present         Urinalysis w/Scope - 81001 Dipstick Dipstick Cont'd Micro  Color: Yellow Bilirubin: Neg mg/dL WBC/hpf: 0 - 5/hpf  Appearance: Clear Ketones: Neg mg/dL RBC/hpf: 0 - 2/hpf  Specific Gravity: 1.025 Blood: Neg ery/uL Bacteria: NS (Not Seen)  pH: 6.0 Protein: Neg mg/dL Cystals: NS (Not Seen)  Glucose: Neg mg/dL Urobilinogen: 0.2 mg/dL Casts: NS (Not Seen)    Nitrites: Neg Trichomonas: Not Present    Leukocyte Esterase: Trace leu/uL Mucous: Not Present      Epithelial Cells: 0 - 5/hpf      Yeast: NS (Not Seen)      Sperm: Not Present    ASSESSMENT:      ICD-10 Details  1 GU:   Interstitial Cystitis (w/o hematuria) - N30.10   2   Urinary Frequency - R35.0      PLAN:            Medications New Meds: Bactrim Ds 800 mg-160 mg tablet 1 tablet PO BID   #20  0 Refill(s)            Document Letter(s):  Created for Patient: Clinical  Summary      After a thorough review of the management options for the patient's condition the patient  elected to proceed with surgical therapy as noted above. We have discussed the potential benefits and risks of the procedure, side effects of the proposed treatment, the likelihood of the patient achieving the goals of the procedure, and any potential problems that might occur during the procedure or recuperation. Informed consent has been obtained.

## 2020-02-20 ENCOUNTER — Encounter (HOSPITAL_COMMUNITY)
Admission: RE | Admit: 2020-02-20 | Discharge: 2020-02-20 | Disposition: A | Discharge status: Home / Self Care | Payer: Medicare Other | Source: Ambulatory Visit | Attending: Family Medicine | Admitting: Family Medicine

## 2020-02-20 ENCOUNTER — Other Ambulatory Visit: Payer: Self-pay

## 2020-02-20 ENCOUNTER — Other Ambulatory Visit (HOSPITAL_COMMUNITY): Payer: Medicare Other

## 2020-02-20 DIAGNOSIS — Z01812 Encounter for preprocedural laboratory examination: Secondary | ICD-10-CM | POA: Diagnosis not present

## 2020-02-20 LAB — CBC
HCT: 41.3 % (ref 36.0–46.0)
Hemoglobin: 14 g/dL (ref 12.0–15.0)
MCH: 30 pg (ref 26.0–34.0)
MCHC: 33.9 g/dL (ref 30.0–36.0)
MCV: 88.4 fL (ref 80.0–100.0)
Platelets: 266 10*3/uL (ref 150–400)
RBC: 4.67 MIL/uL (ref 3.87–5.11)
RDW: 13.8 % (ref 11.5–15.5)
WBC: 8.4 10*3/uL (ref 4.0–10.5)
nRBC: 0 % (ref 0.0–0.2)

## 2020-02-21 ENCOUNTER — Encounter (HOSPITAL_COMMUNITY)
Admission: RE | Disposition: A | Discharge status: Home / Self Care | Payer: Self-pay | Source: Home / Self Care | Attending: Urology

## 2020-02-21 ENCOUNTER — Ambulatory Visit (HOSPITAL_COMMUNITY): Payer: Medicare Other

## 2020-02-21 ENCOUNTER — Encounter (HOSPITAL_COMMUNITY): Payer: Self-pay | Admitting: Urology

## 2020-02-21 ENCOUNTER — Ambulatory Visit (HOSPITAL_COMMUNITY): Payer: Medicare Other | Admitting: Physician Assistant

## 2020-02-21 ENCOUNTER — Ambulatory Visit (HOSPITAL_COMMUNITY)
Admission: RE | Admit: 2020-02-21 | Discharge: 2020-02-21 | Disposition: A | Discharge status: Home / Self Care | Payer: Medicare Other | Attending: Urology | Admitting: Urology

## 2020-02-21 DIAGNOSIS — R32 Unspecified urinary incontinence: Secondary | ICD-10-CM | POA: Diagnosis not present

## 2020-02-21 DIAGNOSIS — Z881 Allergy status to other antibiotic agents status: Secondary | ICD-10-CM | POA: Insufficient documentation

## 2020-02-21 DIAGNOSIS — L409 Psoriasis, unspecified: Secondary | ICD-10-CM | POA: Insufficient documentation

## 2020-02-21 DIAGNOSIS — F419 Anxiety disorder, unspecified: Secondary | ICD-10-CM | POA: Insufficient documentation

## 2020-02-21 DIAGNOSIS — R350 Frequency of micturition: Secondary | ICD-10-CM | POA: Insufficient documentation

## 2020-02-21 DIAGNOSIS — F319 Bipolar disorder, unspecified: Secondary | ICD-10-CM | POA: Diagnosis not present

## 2020-02-21 DIAGNOSIS — X58XXXA Exposure to other specified factors, initial encounter: Secondary | ICD-10-CM | POA: Insufficient documentation

## 2020-02-21 DIAGNOSIS — Z79899 Other long term (current) drug therapy: Secondary | ICD-10-CM | POA: Diagnosis not present

## 2020-02-21 DIAGNOSIS — J45909 Unspecified asthma, uncomplicated: Secondary | ICD-10-CM | POA: Insufficient documentation

## 2020-02-21 DIAGNOSIS — Z1629 Resistance to other single specified antibiotic: Secondary | ICD-10-CM | POA: Diagnosis not present

## 2020-02-21 DIAGNOSIS — Z888 Allergy status to other drugs, medicaments and biological substances status: Secondary | ICD-10-CM | POA: Diagnosis not present

## 2020-02-21 DIAGNOSIS — K219 Gastro-esophageal reflux disease without esophagitis: Secondary | ICD-10-CM | POA: Insufficient documentation

## 2020-02-21 DIAGNOSIS — T85738A Infection and inflammatory reaction due to other nervous system device, implant or graft, initial encounter: Secondary | ICD-10-CM | POA: Diagnosis not present

## 2020-02-21 DIAGNOSIS — E039 Hypothyroidism, unspecified: Secondary | ICD-10-CM | POA: Diagnosis not present

## 2020-02-21 DIAGNOSIS — F17200 Nicotine dependence, unspecified, uncomplicated: Secondary | ICD-10-CM | POA: Diagnosis not present

## 2020-02-21 DIAGNOSIS — N3010 Interstitial cystitis (chronic) without hematuria: Secondary | ICD-10-CM | POA: Diagnosis not present

## 2020-02-21 DIAGNOSIS — B957 Other staphylococcus as the cause of diseases classified elsewhere: Secondary | ICD-10-CM | POA: Diagnosis not present

## 2020-02-21 HISTORY — PX: INTERSTIM IMPLANT REMOVAL: SHX5131

## 2020-02-21 SURGERY — REMOVAL, NEUROSTIMULATOR, SACRAL
Anesthesia: General | Site: Back

## 2020-02-21 MED ORDER — FENTANYL CITRATE (PF) 250 MCG/5ML IJ SOLN
INTRAMUSCULAR | Status: DC | PRN
  Administered 2020-02-21: 50 ug via INTRAVENOUS
  Administered 2020-02-21: 100 ug via INTRAVENOUS

## 2020-02-21 MED ORDER — DEXAMETHASONE SODIUM PHOSPHATE 10 MG/ML IJ SOLN
INTRAMUSCULAR | Status: AC
  Filled 2020-02-21: qty 1

## 2020-02-21 MED ORDER — SODIUM CHLORIDE 0.9 % IV SOLN
INTRAVENOUS | Status: AC
  Filled 2020-02-21: qty 500000

## 2020-02-21 MED ORDER — SUCCINYLCHOLINE CHLORIDE 200 MG/10ML IV SOSY
PREFILLED_SYRINGE | INTRAVENOUS | Status: DC | PRN
  Administered 2020-02-21: 140 mg via INTRAVENOUS

## 2020-02-21 MED ORDER — ONDANSETRON HCL 4 MG/2ML IJ SOLN
INTRAMUSCULAR | Status: AC
  Filled 2020-02-21: qty 2

## 2020-02-21 MED ORDER — ACETAMINOPHEN 500 MG PO TABS
1000.0000 mg | ORAL_TABLET | Freq: Once | ORAL | Status: AC
  Administered 2020-02-21: 09:00:00 1000 mg via ORAL
  Filled 2020-02-21: qty 2

## 2020-02-21 MED ORDER — LIDOCAINE 2% (20 MG/ML) 5 ML SYRINGE
INTRAMUSCULAR | Status: AC
  Filled 2020-02-21: qty 5

## 2020-02-21 MED ORDER — LIDOCAINE-EPINEPHRINE (PF) 1 %-1:200000 IJ SOLN
INTRAMUSCULAR | Status: DC | PRN
  Administered 2020-02-21: 3 mL

## 2020-02-21 MED ORDER — FENTANYL CITRATE (PF) 100 MCG/2ML IJ SOLN
25.0000 ug | INTRAMUSCULAR | Status: DC | PRN
  Administered 2020-02-21: 50 ug via INTRAVENOUS

## 2020-02-21 MED ORDER — SODIUM CHLORIDE 0.9 % IV SOLN
INTRAVENOUS | Status: DC | PRN
  Administered 2020-02-21: 500 mL

## 2020-02-21 MED ORDER — PROPOFOL 10 MG/ML IV BOLUS
INTRAVENOUS | Status: DC | PRN
  Administered 2020-02-21: 50 mg via INTRAVENOUS
  Administered 2020-02-21: 150 mg via INTRAVENOUS

## 2020-02-21 MED ORDER — MIDAZOLAM HCL 5 MG/5ML IJ SOLN
INTRAMUSCULAR | Status: DC | PRN
  Administered 2020-02-21: 2 mg via INTRAVENOUS

## 2020-02-21 MED ORDER — SUCCINYLCHOLINE CHLORIDE 200 MG/10ML IV SOSY
PREFILLED_SYRINGE | INTRAVENOUS | Status: AC
  Filled 2020-02-21: qty 10

## 2020-02-21 MED ORDER — LACTATED RINGERS IV SOLN: INTRAVENOUS | Status: DC

## 2020-02-21 MED ORDER — LIDOCAINE-EPINEPHRINE 1 %-1:100000 IJ SOLN
INTRAMUSCULAR | Status: AC
  Filled 2020-02-21: qty 1

## 2020-02-21 MED ORDER — GENTAMICIN SULFATE 40 MG/ML IJ SOLN
5.0000 mg/kg | INTRAVENOUS | Status: AC
  Administered 2020-02-21: 10:00:00 289 mg via INTRAVENOUS
  Filled 2020-02-21: qty 7.25

## 2020-02-21 MED ORDER — ONDANSETRON HCL 4 MG/2ML IJ SOLN
INTRAMUSCULAR | Status: DC | PRN
  Administered 2020-02-21: 4 mg via INTRAVENOUS

## 2020-02-21 MED ORDER — ROCURONIUM BROMIDE 10 MG/ML (PF) SYRINGE
PREFILLED_SYRINGE | INTRAVENOUS | Status: AC
  Filled 2020-02-21: qty 10

## 2020-02-21 MED ORDER — FENTANYL CITRATE (PF) 250 MCG/5ML IJ SOLN
INTRAMUSCULAR | Status: AC
  Filled 2020-02-21: qty 5

## 2020-02-21 MED ORDER — PROPOFOL 10 MG/ML IV BOLUS
INTRAVENOUS | Status: AC
  Filled 2020-02-21: qty 20

## 2020-02-21 MED ORDER — DEXAMETHASONE SODIUM PHOSPHATE 10 MG/ML IJ SOLN
INTRAMUSCULAR | Status: DC | PRN
  Administered 2020-02-21: 5 mg via INTRAVENOUS

## 2020-02-21 MED ORDER — PROMETHAZINE HCL 25 MG/ML IJ SOLN: 6.2500 mg | INTRAMUSCULAR | Status: DC | PRN

## 2020-02-21 MED ORDER — MIDAZOLAM HCL 2 MG/2ML IJ SOLN
INTRAMUSCULAR | Status: AC
  Filled 2020-02-21: qty 2

## 2020-02-21 MED ORDER — CELECOXIB 200 MG PO CAPS
200.0000 mg | ORAL_CAPSULE | Freq: Once | ORAL | Status: AC
  Administered 2020-02-21: 200 mg via ORAL
  Filled 2020-02-21: qty 1

## 2020-02-21 MED ORDER — FENTANYL CITRATE (PF) 100 MCG/2ML IJ SOLN
INTRAMUSCULAR | Status: AC
  Filled 2020-02-21: qty 2

## 2020-02-21 MED ORDER — LIDOCAINE 2% (20 MG/ML) 5 ML SYRINGE
INTRAMUSCULAR | Status: DC | PRN
  Administered 2020-02-21: 60 mg via INTRAVENOUS

## 2020-02-21 SURGICAL SUPPLY — 34 items
ADH SKN CLS APL DERMABOND .7 (GAUZE/BANDAGES/DRESSINGS)
COVER WAND RF STERILE (DRAPES) IMPLANT
DECANTER SPIKE VIAL GLASS SM (MISCELLANEOUS) ×1 IMPLANT
DERMABOND ADVANCED (GAUZE/BANDAGES/DRESSINGS)
DERMABOND ADVANCED .7 DNX12 (GAUZE/BANDAGES/DRESSINGS) ×1 IMPLANT
DRAPE INCISE 23X17 IOBAN STRL (DRAPES) ×1
DRAPE INCISE 23X17 STRL (DRAPES) ×1 IMPLANT
DRAPE INCISE IOBAN 23X17 STRL (DRAPES) ×1 IMPLANT
DRAPE LAPAROSCOPIC ABDOMINAL (DRAPES) ×2 IMPLANT
DRSG TEGADERM 2-3/8X2-3/4 SM (GAUZE/BANDAGES/DRESSINGS) ×1 IMPLANT
DRSG TEGADERM 4X4.75 (GAUZE/BANDAGES/DRESSINGS) ×4 IMPLANT
DRSG TELFA 3X8 NADH (GAUZE/BANDAGES/DRESSINGS) ×2 IMPLANT
DRSG TELFA 4X8 ISLAND (GAUZE/BANDAGES/DRESSINGS) ×1 IMPLANT
GAUZE 4X4 16PLY RFD (DISPOSABLE) ×1 IMPLANT
GLOVE BIOGEL M STRL SZ7.5 (GLOVE) ×2 IMPLANT
GOWN STRL REUS W/TWL XL LVL3 (GOWN DISPOSABLE) ×2 IMPLANT
KIT BASIN OR (CUSTOM PROCEDURE TRAY) ×2 IMPLANT
KIT TURNOVER KIT A (KITS) IMPLANT
NDL HYPO 25X1 1.5 SAFETY (NEEDLE) IMPLANT
NEEDLE HYPO 25X1 1.5 SAFETY (NEEDLE) IMPLANT
NS IRRIG 1000ML POUR BTL (IV SOLUTION) IMPLANT
PACK GENERAL/GYN (CUSTOM PROCEDURE TRAY) ×2 IMPLANT
PAD DRESSING TELFA 3X8 NADH (GAUZE/BANDAGES/DRESSINGS) ×1 IMPLANT
PENCIL SMOKE EVACUATOR (MISCELLANEOUS) IMPLANT
SUT CHROMIC 3 0 SH 27 (SUTURE) ×3 IMPLANT
SUT ETHILON 3 0 PS 1 (SUTURE) ×2 IMPLANT
SUT VIC AB 2-0 UR6 27 (SUTURE) ×2 IMPLANT
SUT VIC AB 3-0 SH 27 (SUTURE) ×2
SUT VIC AB 3-0 SH 27X BRD (SUTURE) IMPLANT
SUT VIC AB 4-0 PS2 27 (SUTURE) ×3 IMPLANT
SYR CONTROL 10ML LL (SYRINGE) IMPLANT
TOWEL OR 17X26 10 PK STRL BLUE (TOWEL DISPOSABLE) ×3 IMPLANT
TRAY PREP A LATEX SAFE STRL (SET/KITS/TRAYS/PACK) ×1 IMPLANT
WATER STERILE IRR 1000ML POUR (IV SOLUTION) IMPLANT

## 2020-02-21 NOTE — Interval H&P Note (Signed)
History and Physical Interval Note:  02/21/2020 11:34 AM  [name]  has presented today for surgery, with the diagnosis of INFECTED INTERSTIM.  The various methods of treatment have been discussed with the patient and family. After consideration of risks, benefits and other options for treatment, the patient has consented to  Procedure(s): REMOVAL OF INTERSTIM IMPLANT (N/A) as a surgical intervention.  The patient's history has been reviewed, patient examined, no change in status, stable for surgery.  I have reviewed the patient's chart and labs.  Questions were answered to the patient's satisfaction.     Coleen Cardiff A Owin Vignola

## 2020-02-21 NOTE — Anesthesia Postprocedure Evaluation (Signed)
Anesthesia Post Note  Patient: [name]  Procedure(s) Performed: REMOVAL OF INTERSTIM IMPLANT (N/A Back)     Patient location during evaluation: PACU Anesthesia Type: General Level of consciousness: sedated Pain management: pain level controlled Vital Signs Assessment: post-procedure vital signs reviewed and stable Respiratory status: spontaneous breathing and respiratory function stable Cardiovascular status: stable Postop Assessment: no apparent nausea or vomiting Anesthetic complications: no    Last Vitals:  Vitals:   02/21/20 1301 02/21/20 1315  BP: 116/64 (!) 114/56  Pulse: 81 80  Resp: 14   Temp: 36.6 C   SpO2: 100% 96%    Last Pain:  Vitals:   02/21/20 1301  TempSrc: Oral  PainSc:                  Hamad Whyte DANIEL

## 2020-02-21 NOTE — Anesthesia Procedure Notes (Signed)
Procedure Name: Intubation Date/Time: 02/21/2020 10:29 AM Performed by: Talbot Grumbling, CRNA Pre-anesthesia Checklist: Patient identified, Emergency Drugs available, Suction available and Patient being monitored Patient Re-evaluated:Patient Re-evaluated prior to induction Oxygen Delivery Method: Circle system utilized Preoxygenation: Pre-oxygenation with 100% oxygen Induction Type: IV induction Ventilation: Mask ventilation without difficulty Laryngoscope Size: Mac and 3 Grade View: Grade I Tube type: Oral Tube size: 7.0 mm Number of attempts: 1 Airway Equipment and Method: Stylet Placement Confirmation: ETT inserted through vocal cords under direct vision,  positive ETCO2 and breath sounds checked- equal and bilateral Secured at: 21 cm Tube secured with: Tape Dental Injury: Teeth and Oropharynx as per pre-operative assessment

## 2020-02-21 NOTE — Op Note (Signed)
Preop diagnosis: Infected InterStim Postop diagnosis: Infected InterStim Surgery: Removal of infected InterStim and fluoroscopy Surgeon: Dr. Nicki Reaper Anddy Wingert  The patient has the above diagnosis and consented the above procedure.  Extra care was taken with positioning the patient.  Fluoroscopy was used.  Extra skin preparation utilized.  I could easily identified radiographically and by palpation the right sided IPG.  She was using dischargeable device.  I could see the lead on her left side through the S3 foramina.  Her pain stimulator was high on her left side away from the field.  I opened the right upper buttock incision and carried down open the pseudocapsule removing the IPG.  I cut the lead and dispose of the IPG.  I opened up the back of the pseudocapsule Cultures were taken.  Tissues were indurated but not obviously infected.  I opened up the left midline incision 2 cm and carried down and could feel the lead.  I gently pulled back in lead from the lateral side and came out easily.  The tissues look normal on the patient's left side incision.  The pus pocket involve the right midline incision overlying the course of the lead from right to midline.  It was open approximately 2 cm I dissected down.  I fulgurated the base.  I closed all 3 incisions with 3-0 Vicryl subcutaneous tissue loosely.  I used loose interrupted 3-0 chromic for all skin incisions.  Sterile dressing was applied  The lead was removed in total proven by inspection and x-ray.  She will be followed as per protocol

## 2020-02-21 NOTE — Anesthesia Preprocedure Evaluation (Addendum)
Anesthesia Evaluation  Patient identified by MRN, date of birth, ID band Patient awake    Reviewed: Allergy & Precautions, NPO status , Patient's Chart, lab work & pertinent test results  History of Anesthesia Complications Negative for: history of anesthetic complications  Airway Mallampati: II  TM Distance: >3 FB Neck ROM: Full    Dental  (+) Dental Advisory Given, Implants, Edentulous Lower   Pulmonary asthma , Current SmokerPatient did not abstain from smoking.,    Pulmonary exam normal        Cardiovascular Exercise Tolerance: Good Normal cardiovascular exam     Neuro/Psych PSYCHIATRIC DISORDERS Anxiety Depression Bipolar Disorder negative neurological ROS     GI/Hepatic Neg liver ROS, GERD  Medicated,  Endo/Other  Hypothyroidism   Renal/GU negative Renal ROS   unrinary incontinence    Musculoskeletal Hx of chronicpain   Abdominal   Peds  Hematology negative hematology ROS (+)   Anesthesia Other Findings All: see list  Reproductive/Obstetrics preg test result: weak positive  Pt denies that she could possibly be pregnant because she only has female partners.                           Anesthesia Physical  Anesthesia Plan  ASA: II  Anesthesia Plan: General   Post-op Pain Management:    Induction: Intravenous  PONV Risk Score and Plan: 3 and Treatment may vary due to age or medical condition, Ondansetron, Dexamethasone and Midazolam  Airway Management Planned: Oral ETT and LMA  Additional Equipment: None  Intra-op Plan:   Post-operative Plan: Extubation in OR  Informed Consent: I have reviewed the patients History and Physical, chart, labs and discussed the procedure including the risks, benefits and alternatives for the proposed anesthesia with the patient or authorized representative who has indicated his/her understanding and acceptance.     Dental advisory  given  Plan Discussed with: Anesthesiologist and CRNA  Anesthesia Plan Comments:        Anesthesia Quick Evaluation

## 2020-02-21 NOTE — Transfer of Care (Signed)
Immediate Anesthesia Transfer of Care Note  Patient: Lakethia A Helgeson  Procedure(s) Performed: REMOVAL OF INTERSTIM IMPLANT (N/A Back)  Patient Location: PACU  Anesthesia Type:General  Level of Consciousness: sedated  Airway & Oxygen Therapy: Patient Spontanous Breathing and Patient connected to face mask oxygen  Post-op Assessment: Report given to RN and Post -op Vital signs reviewed and stable  Post vital signs: Reviewed and stable  Last Vitals:  Vitals Value Taken Time  BP    Temp    Pulse 79 02/21/20 1153  Resp 18 02/21/20 1153  SpO2 92 % 02/21/20 1153  Vitals shown include unvalidated device data.  Last Pain:  Vitals:   02/21/20 0846  TempSrc:   PainSc: 0-No pain         Complications: No apparent anesthesia complications

## 2020-02-21 NOTE — Discharge Instructions (Signed)
I have reviewed discharge instructions in detail with the patient. They will follow-up with me or their physician as scheduled. My nurse will also be calling the patient as per protocol.  Dressing may be removed on Saturday. Pt may shower, but no tub baths until directed by physician. Prescriptions have been sent to patient's pharmacy on file, [facility] on [address] in [place].

## 2020-02-22 ENCOUNTER — Encounter: Payer: Self-pay | Admitting: *Deleted

## 2020-02-26 LAB — AEROBIC/ANAEROBIC CULTURE W GRAM STAIN (SURGICAL/DEEP WOUND)

## 2020-03-06 ENCOUNTER — Other Ambulatory Visit: Payer: Self-pay | Admitting: Urology

## 2020-04-02 NOTE — Patient Instructions (Addendum)
DUE TO COVID-19 ONLY ONE VISITOR IS ALLOWED TO COME WITH YOU AND STAY IN THE WAITING ROOM ONLY DURING PRE OP AND PROCEDURE DAY OF SURGERY. THE 1 VISITOR MAY VISIT WITH YOU AFTER SURGERY IN YOUR PRIVATE ROOM DURING VISITING HOURS ONLY!  YOU NEED TO HAVE A COVID 19 TEST ON 04-07-20 @ 10:05 AM, THIS TEST MUST BE DONE BEFORE SURGERY, COME  [place], [place] , [address].  ([place]) ONCE YOUR COVID TEST IS COMPLETED, PLEASE BEGIN THE QUARANTINE INSTRUCTIONS AS OUTLINED IN YOUR HANDOUT.                ALEIDA SCHATZLE  04/02/2020   Your procedure is scheduled on: 04-11-20   Report to [facility] Main  Entrance    Report to Admitting at 1:15 PM     Call this number if you have problems the morning of surgery [phone]    Remember: Do not eat food or drink liquids :After Midnight, you may have a Clear Liquid Diet until 9:15 AM. After 9:15 AM, nothing until after surgery.     CLEAR LIQUID DIET   Foods Allowed                                                                     Foods Excluded  Coffee and tea, regular and decaf                             liquids that you cannot  Plain Jell-O any favor except red or purple                                           see through such as: Fruit ices (not with fruit pulp)                                     milk, soups, orange juice  Iced Popsicles                                    All solid food Carbonated beverages, regular and diet                                    Cranberry, grape and apple juices Sports drinks like Gatorade Lightly seasoned clear broth or consume(fat free) Sugar, honey syrup  _____________________________________________________________________     Take these medicines the morning of surgery with A SIP OF WATER: Aripiprazole (Abilify), Hydroyzine (Vistaril), Levothyroxine (Synthroid), and Vortioxetine (Trintelllix). You may also use your inhaler   BRUSH YOUR TEETH MORNING OF  SURGERY AND RINSE YOUR MOUTH OUT, NO CHEWING GUM CANDY OR MINTS.                                 You may not have any metal  on your body including hair pins and              piercings    Do not wear jewelry, make-up, lotions, powders or perfumes, deodorant             Do not wear nail polish on your fingernails.  Do not shave  48 hours prior to surgery.     Do not bring valuables to the hospital. [place].  Contacts, dentures or bridgework may not be worn into surgery.  .     Patients discharged the day of surgery will not be allowed to drive home. IF YOU ARE HAVING SURGERY AND GOING HOME THE SAME DAY, YOU MUST HAVE AN ADULT TO DRIVE YOU HOME AND BE WITH YOU FOR 24 HOURS. YOU MAY GO HOME BY TAXI OR UBER OR ORTHERWISE, BUT AN ADULT MUST ACCOMPANY YOU HOME AND STAY WITH YOU FOR 24 HOURS.  Name and phone number of your driver: Zane Machida  Special Instructions: N/A              Please read over the following fact sheets you were given: _____________________________________________________________________             [facility] - Preparing for Surgery Before surgery, you can play an important role.  Because skin is not sterile, your skin needs to be as free of germs as possible.  You can reduce the number of germs on your skin by washing with CHG (chlorahexidine gluconate) soap before surgery.  CHG is an antiseptic cleaner which kills germs and bonds with the skin to continue killing germs even after washing. Please DO NOT use if you have an allergy to CHG or antibacterial soaps.  If your skin becomes reddened/irritated stop using the CHG and inform your nurse when you arrive at Short Stay. Do not shave (including legs and underarms) for at least 48 hours prior to the first CHG shower.  You may shave your face/neck. Please follow these instructions carefully:  1.  Shower with CHG Soap the night before surgery and the  morning of  Surgery.  2.  If you choose to wash your hair, wash your hair first as usual with your  normal  shampoo.  3.  After you shampoo, rinse your hair and body thoroughly to remove the  shampoo.                           4.  Use CHG as you would any other liquid soap.  You can apply chg directly  to the skin and wash                       Gently with a scrungie or clean washcloth.  5.  Apply the CHG Soap to your body ONLY FROM THE NECK DOWN.   Do not use on face/ open                           Wound or open sores. Avoid contact with eyes, ears mouth and genitals (private parts).                       Wash face,  Genitals (private parts) with your normal soap.  6.  Wash thoroughly, paying special attention to the area where your surgery  will be performed.  7.  Thoroughly rinse your body with warm water from the neck down.  8.  DO NOT shower/wash with your normal soap after using and rinsing off  the CHG Soap.                9.  Pat yourself dry with a clean towel.            10.  Wear clean pajamas.            11.  Place clean sheets on your bed the night of your first shower and do not  sleep with pets. Day of Surgery : Do not apply any lotions/deodorants the morning of surgery.  Please wear clean clothes to the hospital/surgery center.  FAILURE TO FOLLOW THESE INSTRUCTIONS MAY RESULT IN THE CANCELLATION OF YOUR SURGERY PATIENT SIGNATURE_________________________________  NURSE SIGNATURE__________________________________  ________________________________________________________________________

## 2020-04-02 NOTE — Progress Notes (Signed)
PCP - Helane Rima, MD Cardiologist -   Chest x-ray -  EKG -  Stress Test -  ECHO -  Cardiac Cath -   Sleep Study -  CPAP -   Fasting Blood Sugar -  Checks Blood Sugar _____ times a day  Blood Thinner Instructions: Aspirin Instructions: Last Dose:  Anesthesia review:   Patient denies shortness of breath, fever, cough and chest pain at PAT appointment   Patient verbalized understanding of instructions that were given to them at the PAT appointment. Patient was also instructed that they will need to review over the PAT instructions again at home before surgery.

## 2020-04-03 ENCOUNTER — Other Ambulatory Visit: Payer: Self-pay

## 2020-04-03 ENCOUNTER — Encounter (HOSPITAL_COMMUNITY)
Admission: RE | Admit: 2020-04-03 | Discharge: 2020-04-03 | Disposition: A | Discharge status: Home / Self Care | Payer: Medicare Other | Source: Ambulatory Visit | Attending: Urology | Admitting: Urology

## 2020-04-03 ENCOUNTER — Encounter (HOSPITAL_COMMUNITY): Payer: Self-pay

## 2020-04-03 DIAGNOSIS — Z01812 Encounter for preprocedural laboratory examination: Secondary | ICD-10-CM | POA: Diagnosis not present

## 2020-04-03 LAB — CBC
HCT: 42.5 % (ref 36.0–46.0)
Hemoglobin: 14.6 g/dL (ref 12.0–15.0)
MCH: 30 pg (ref 26.0–34.0)
MCHC: 34.4 g/dL (ref 30.0–36.0)
MCV: 87.4 fL (ref 80.0–100.0)
Platelets: 287 10*3/uL (ref 150–400)
RBC: 4.86 MIL/uL (ref 3.87–5.11)
RDW: 13.1 % (ref 11.5–15.5)
WBC: 9.5 10*3/uL (ref 4.0–10.5)
nRBC: 0 % (ref 0.0–0.2)

## 2020-04-07 ENCOUNTER — Other Ambulatory Visit (HOSPITAL_COMMUNITY)
Admission: RE | Admit: 2020-04-07 | Discharge: 2020-04-07 | Disposition: A | Discharge status: Home / Self Care | Payer: Medicare Other | Source: Ambulatory Visit | Attending: Urology | Admitting: Urology

## 2020-04-07 DIAGNOSIS — Z01812 Encounter for preprocedural laboratory examination: Secondary | ICD-10-CM | POA: Diagnosis present

## 2020-04-07 DIAGNOSIS — Z20822 Contact with and (suspected) exposure to covid-19: Secondary | ICD-10-CM | POA: Diagnosis not present

## 2020-04-07 LAB — SARS CORONAVIRUS 2 (TAT 6-24 HRS): SARS Coronavirus 2: NEGATIVE

## 2020-04-11 ENCOUNTER — Encounter (HOSPITAL_COMMUNITY): Payer: Self-pay | Admitting: Urology

## 2020-04-11 ENCOUNTER — Ambulatory Visit (HOSPITAL_COMMUNITY): Payer: Medicare Other | Admitting: Anesthesiology

## 2020-04-11 ENCOUNTER — Ambulatory Visit (HOSPITAL_COMMUNITY): Payer: Medicare Other

## 2020-04-11 ENCOUNTER — Ambulatory Visit (HOSPITAL_COMMUNITY)
Admission: RE | Admit: 2020-04-11 | Discharge: 2020-04-11 | Disposition: A | Discharge status: Home / Self Care | Payer: Medicare Other | Attending: Urology | Admitting: Urology

## 2020-04-11 ENCOUNTER — Encounter (HOSPITAL_COMMUNITY)
Admission: RE | Disposition: A | Discharge status: Home / Self Care | Payer: Self-pay | Source: Home / Self Care | Attending: Urology

## 2020-04-11 DIAGNOSIS — Z515 Encounter for palliative care: Secondary | ICD-10-CM

## 2020-04-11 DIAGNOSIS — E039 Hypothyroidism, unspecified: Secondary | ICD-10-CM | POA: Insufficient documentation

## 2020-04-11 DIAGNOSIS — Z888 Allergy status to other drugs, medicaments and biological substances status: Secondary | ICD-10-CM | POA: Diagnosis not present

## 2020-04-11 DIAGNOSIS — N3010 Interstitial cystitis (chronic) without hematuria: Secondary | ICD-10-CM | POA: Insufficient documentation

## 2020-04-11 DIAGNOSIS — N3941 Urge incontinence: Secondary | ICD-10-CM | POA: Insufficient documentation

## 2020-04-11 HISTORY — PX: INTERSTIM IMPLANT PLACEMENT: SHX5130

## 2020-04-11 SURGERY — INSERTION, SACRAL NERVE STIMULATOR, INTERSTIM, STAGE 1
Anesthesia: General

## 2020-04-11 MED ORDER — EPHEDRINE 5 MG/ML INJ
INTRAVENOUS | Status: AC
  Filled 2020-04-11: qty 10

## 2020-04-11 MED ORDER — CELECOXIB 200 MG PO CAPS
200.0000 mg | ORAL_CAPSULE | Freq: Once | ORAL | Status: AC
  Administered 2020-04-11: 200 mg via ORAL
  Filled 2020-04-11: qty 1

## 2020-04-11 MED ORDER — FENTANYL CITRATE (PF) 100 MCG/2ML IJ SOLN
INTRAMUSCULAR | Status: AC
  Filled 2020-04-11: qty 2

## 2020-04-11 MED ORDER — PROPOFOL 500 MG/50ML IV EMUL
INTRAVENOUS | Status: DC | PRN
  Administered 2020-04-11 (×2): 20 mg via INTRAVENOUS

## 2020-04-11 MED ORDER — ONDANSETRON HCL 4 MG/2ML IJ SOLN
INTRAMUSCULAR | Status: DC | PRN
  Administered 2020-04-11: 4 mg via INTRAVENOUS

## 2020-04-11 MED ORDER — BUPIVACAINE-EPINEPHRINE 0.5% -1:200000 IJ SOLN
INTRAMUSCULAR | Status: AC
  Filled 2020-04-11: qty 1

## 2020-04-11 MED ORDER — PROPOFOL 10 MG/ML IV BOLUS
INTRAVENOUS | Status: AC
  Filled 2020-04-11: qty 40

## 2020-04-11 MED ORDER — PHENYLEPHRINE 40 MCG/ML (10ML) SYRINGE FOR IV PUSH (FOR BLOOD PRESSURE SUPPORT)
PREFILLED_SYRINGE | INTRAVENOUS | Status: AC
  Filled 2020-04-11: qty 10

## 2020-04-11 MED ORDER — LIDOCAINE HCL (CARDIAC) PF 100 MG/5ML IV SOSY
PREFILLED_SYRINGE | INTRAVENOUS | Status: DC | PRN
  Administered 2020-04-11: 50 mg via INTRATRACHEAL

## 2020-04-11 MED ORDER — SODIUM CHLORIDE 0.9 % IV SOLN
INTRAVENOUS | Status: AC
  Filled 2020-04-11: qty 500000

## 2020-04-11 MED ORDER — LIDOCAINE-EPINEPHRINE 1 %-1:100000 IJ SOLN
INTRAMUSCULAR | Status: AC
  Filled 2020-04-11: qty 1

## 2020-04-11 MED ORDER — LIDOCAINE-EPINEPHRINE 1 %-1:100000 IJ SOLN
INTRAMUSCULAR | Status: DC | PRN
  Administered 2020-04-11: 5 mL

## 2020-04-11 MED ORDER — PROPOFOL 500 MG/50ML IV EMUL
INTRAVENOUS | Status: DC | PRN
  Administered 2020-04-11: 125 ug/kg/min via INTRAVENOUS

## 2020-04-11 MED ORDER — ONDANSETRON HCL 4 MG/2ML IJ SOLN
INTRAMUSCULAR | Status: AC
  Filled 2020-04-11: qty 2

## 2020-04-11 MED ORDER — CIPROFLOXACIN IN D5W 400 MG/200ML IV SOLN
400.0000 mg | INTRAVENOUS | Status: AC
  Administered 2020-04-11: 400 mg via INTRAVENOUS
  Filled 2020-04-11: qty 200

## 2020-04-11 MED ORDER — HYDROCODONE-ACETAMINOPHEN 5-325 MG PO TABS: 1.0000 | ORAL_TABLET | Freq: Four times a day (QID) | ORAL | 0 refills | Status: AC | PRN

## 2020-04-11 MED ORDER — LIDOCAINE 2% (20 MG/ML) 5 ML SYRINGE
INTRAMUSCULAR | Status: AC
  Filled 2020-04-11: qty 5

## 2020-04-11 MED ORDER — VANCOMYCIN HCL IN DEXTROSE 1-5 GM/200ML-% IV SOLN
1000.0000 mg | INTRAVENOUS | Status: AC
  Administered 2020-04-11: 1000 mg via INTRAVENOUS
  Filled 2020-04-11: qty 200

## 2020-04-11 MED ORDER — FENTANYL CITRATE (PF) 100 MCG/2ML IJ SOLN
INTRAMUSCULAR | Status: DC | PRN
  Administered 2020-04-11 (×2): 50 ug via INTRAVENOUS

## 2020-04-11 MED ORDER — LACTATED RINGERS IV SOLN: INTRAVENOUS | Status: DC

## 2020-04-11 MED ORDER — STERILE WATER FOR IRRIGATION IR SOLN
Status: DC | PRN
  Administered 2020-04-11: 1000 mL

## 2020-04-11 MED ORDER — CIPROFLOXACIN HCL 250 MG PO TABS
250.0000 mg | ORAL_TABLET | Freq: Two times a day (BID) | ORAL | 0 refills | Status: AC
Start: 2020-04-11 — End: 2020-04-21

## 2020-04-11 MED ORDER — FENTANYL CITRATE (PF) 100 MCG/2ML IJ SOLN
25.0000 ug | INTRAMUSCULAR | Status: DC | PRN
  Administered 2020-04-11: 50 ug via INTRAVENOUS

## 2020-04-11 MED ORDER — BUPIVACAINE-EPINEPHRINE 0.5% -1:200000 IJ SOLN
INTRAMUSCULAR | Status: DC | PRN
  Administered 2020-04-11: 5 mL

## 2020-04-11 MED ORDER — MIDAZOLAM HCL 2 MG/2ML IJ SOLN
INTRAMUSCULAR | Status: AC
  Filled 2020-04-11: qty 2

## 2020-04-11 MED ORDER — PROMETHAZINE HCL 25 MG/ML IJ SOLN: 6.2500 mg | INTRAMUSCULAR | Status: DC | PRN

## 2020-04-11 MED ORDER — MIDAZOLAM HCL 5 MG/5ML IJ SOLN
INTRAMUSCULAR | Status: DC | PRN
  Administered 2020-04-11: 2 mg via INTRAVENOUS

## 2020-04-11 MED ORDER — ACETAMINOPHEN 500 MG PO TABS
1000.0000 mg | ORAL_TABLET | Freq: Once | ORAL | Status: AC
  Administered 2020-04-11: 14:00:00 1000 mg via ORAL
  Filled 2020-04-11: qty 2

## 2020-04-11 SURGICAL SUPPLY — 63 items
ADH SKN CLS APL DERMABOND .7 (GAUZE/BANDAGES/DRESSINGS) ×1
APL PRP STRL LF DISP 70% ISPRP (MISCELLANEOUS) ×1
APL SKNCLS STERI-STRIP NONHPOA (GAUZE/BANDAGES/DRESSINGS) ×1
BELT PT INTERSTIM MICRO SYSTEM (MISCELLANEOUS) ×2 IMPLANT
BELT SPRT LRG NS LF REUSE (MISCELLANEOUS) ×1
BENZOIN TINCTURE PRP APPL 2/3 (GAUZE/BANDAGES/DRESSINGS) ×2 IMPLANT
BLADE HEX COATED 2.75 (ELECTRODE) ×2 IMPLANT
BLADE SURG 15 STRL LF DISP TIS (BLADE) ×1 IMPLANT
BLADE SURG 15 STRL SS (BLADE) ×2
BLADE SURG SZ10 CARB STEEL (BLADE) IMPLANT
CHLORAPREP W/TINT 26 (MISCELLANEOUS) ×2 IMPLANT
COVER TRANSDUCER ULTRASND GEL (DRAPE) ×2 IMPLANT
COVER WAND RF STERILE (DRAPES) IMPLANT
DECANTER SPIKE VIAL GLASS SM (MISCELLANEOUS) ×4 IMPLANT
DERMABOND ADVANCED (GAUZE/BANDAGES/DRESSINGS) ×1
DERMABOND ADVANCED .7 DNX12 (GAUZE/BANDAGES/DRESSINGS) ×1 IMPLANT
DRAPE C-ARM 42X120 X-RAY (DRAPES) ×2 IMPLANT
DRAPE C-ARMOR (DRAPES) ×2 IMPLANT
DRAPE INCISE 23X17 IOBAN STRL (DRAPES) ×1
DRAPE INCISE IOBAN 23X17 STRL (DRAPES) ×1 IMPLANT
DRAPE LAPAROSCOPIC ABDOMINAL (DRAPES) ×2 IMPLANT
DRAPE SHEET LG 3/4 BI-LAMINATE (DRAPES) ×2 IMPLANT
DRSG TEGADERM 2-3/8X2-3/4 SM (GAUZE/BANDAGES/DRESSINGS) ×2 IMPLANT
DRSG TEGADERM 4X4.75 (GAUZE/BANDAGES/DRESSINGS) ×2 IMPLANT
DRSG TELFA 3X8 NADH (GAUZE/BANDAGES/DRESSINGS) ×2 IMPLANT
DRSG TELFA PLUS 4X6 ADH ISLAND (GAUZE/BANDAGES/DRESSINGS) ×2 IMPLANT
ENVELOPE ABSORB ANTIBACTERIAL (Neuro Prosthesis/Implant) ×2 IMPLANT
GAUZE 4X4 16PLY RFD (DISPOSABLE) ×2 IMPLANT
GAUZE SPONGE 4X4 12PLY STRL (GAUZE/BANDAGES/DRESSINGS) IMPLANT
GLOVE BIO SURGEON STRL SZ7.5 (GLOVE) ×2 IMPLANT
GLOVE BIOGEL M STRL SZ7.5 (GLOVE) ×2 IMPLANT
GOWN STRL REUS W/TWL XL LVL3 (GOWN DISPOSABLE) ×4 IMPLANT
KIT BASIN (CUSTOM PROCEDURE TRAY) ×2 IMPLANT
KIT HANDSET INTERSTIM COMM (NEUROSURGERY SUPPLIES) ×2 IMPLANT
KIT RECHARGE INTERSTIM (NEUROSURGERY SUPPLIES) ×2 IMPLANT
KIT TURNOVER KIT A (KITS) IMPLANT
LEAD INTERSTIM 2.16 28 L (Lead) ×2 IMPLANT
NEEDLE FORAMEN 20GA 5  12.5CM (NEEDLE) IMPLANT
NEEDLE HYPO 25X1 1.5 SAFETY (NEEDLE) IMPLANT
NEUROSTIM INTERSTIM (Neurostimulator) ×1 IMPLANT
NEUROSTIMULATOR 1.7X2X.06 (UROLOGICAL SUPPLIES) ×2 IMPLANT
NEUROSTIMULATOR INTERSTIM (Neurostimulator) ×2 IMPLANT
NS IRRIG 1000ML POUR BTL (IV SOLUTION) IMPLANT
PACK GENERAL/GYN (CUSTOM PROCEDURE TRAY) ×2 IMPLANT
PENCIL SMOKE EVACUATOR (MISCELLANEOUS) IMPLANT
SOL PREP PROV IODINE SCRUB 4OZ (MISCELLANEOUS) IMPLANT
STAPLER VISISTAT 35W (STAPLE) IMPLANT
SUT ETHILON 3 0 PS 1 (SUTURE) IMPLANT
SUT PROLENE 2 0 CT2 30 (SUTURE) IMPLANT
SUT SILK 2 0 (SUTURE)
SUT SILK 2 0 SH (SUTURE) IMPLANT
SUT SILK 2-0 18XBRD TIE 12 (SUTURE) IMPLANT
SUT VIC AB 2-0 UR6 27 (SUTURE) ×4 IMPLANT
SUT VIC AB 3-0 SH 27 (SUTURE) ×2
SUT VIC AB 3-0 SH 27X BRD (SUTURE) ×1 IMPLANT
SUT VIC AB 4-0 PS2 18 (SUTURE) ×2 IMPLANT
SUT VIC AB 4-0 PS2 27 (SUTURE) ×4 IMPLANT
SYR CONTROL 10ML LL (SYRINGE) IMPLANT
TOWEL OR 17X26 10 PK STRL BLUE (TOWEL DISPOSABLE) ×2 IMPLANT
TOWEL OR NON WOVEN STRL DISP B (DISPOSABLE) ×2 IMPLANT
TRAY FOLEY MTR SLVR 16FR STAT (SET/KITS/TRAYS/PACK) IMPLANT
TRAY PREP A LATEX SAFE STRL (SET/KITS/TRAYS/PACK) ×2 IMPLANT
WATER STERILE IRR 1000ML POUR (IV SOLUTION) ×2 IMPLANT

## 2020-04-11 NOTE — Transfer of Care (Signed)
Immediate Anesthesia Transfer of Care Note  Patient: [name]  Procedure(s) Performed: Barrie Lyme IMPLANT FIRST STAGE (N/A ) INTERSTIM IMPLANT SECOND STAGE (N/A )  Patient Location: PACU  Anesthesia Type:MAC  Level of Consciousness: awake, alert , oriented and patient cooperative  Airway & Oxygen Therapy: Patient Spontanous Breathing and Patient connected to face mask oxygen  Post-op Assessment: Report given to RN, Post -op Vital signs reviewed and stable and Patient moving all extremities X 4  Post vital signs: stable  Last Vitals:  Vitals Value Taken Time  BP 120/76 04/11/20 1646  Temp 36.7 C 04/11/20 1645  Pulse 85 04/11/20 1645  Resp 20 04/11/20 1645  SpO2 96 % 04/11/20 1645    Last Pain:  Vitals:   04/11/20 1645  PainSc: 4          Complications: No apparent anesthesia complications

## 2020-04-11 NOTE — H&P (Signed)
On March 23 patient had removal of infected InterStim. She was using the charge double device. The infection was in the midline and grew Staph aureus sensitive to vancomycin Cipro and sulfa and doxycycline and gentamicin   The patient has a pain stimulator on left side high buttock region. The original IPG was quite lateral and the battery life had diminished. The original InterStim was in left S3 foramina and this lead is in S3 left foramina. It was difficult to get a nice curve but I finally did. I reviewed the note. I had made a smaller pocket for the rechargeable   She now voids every 30 minutes gets up once or twice a night. No pelvic pain or pressure or burning. She does not get pelvic pain   Incisions healing well   I thought it was reasonable to place a new device on her right side in 4-5 weeks. I will give her appropriate antibiotics. We will consider using tyrex to reduce the incidence of infection. I would like to stay away from her left pain stimulator. Pros cons risks and increased risk of infection discussed.   The patient will have 3 medications renewed at 3 months and 3 refills. These will include oxybutynin, Flomax, and desmopressin. I will also cancel her April appointment and we will schedule surgery for her in about a month   The patient my me she had scratched the medial incision with the finger nail it made weep some perhaps is said the infection. It is difficult to say. I remember her tell me this distantly. She will get vancomycin and ciprofloxacin. We will also give her Bactrim DS 1 tablet twice a day for 5 days following surgery. I will change clothes as well during the case and irrigate as usual. ChloraPrep will be utilized     ALLERGIES: Benadryl TABS Claritin TABS Doxycycline Hyclate CAPS    MEDICATIONS: Oxybutynin Chloride Er 15 mg tablet, extended release 24 hr 1 tablet PO Daily  Percocet 5 mg-325 mg tablet 1-2 tablet PO Q 8 H PRN  Tamsulosin Hcl 0.4 mg capsule 1  capsule PO Daily  Abilify 5 mg tablet  Desmopressin Acetate 0.2 mg tablet 2 tablet PO Q HS  Doxepin Hcl 100 mg capsule  Geodon  Levoxyl 50 mcg tablet Oral  Nucynta  Prilosec 20 mg capsule,delayed release Oral  Trintellix 10 mg tablet     GU PSH: Interstim Removal - 02/21/2020 Interstim Stage 2 Generator - 08/09/2019 Interstim Stage One Neurostimulator - 08/09/2019 Revise/remove Neuroreceiver - 02/21/2020       PSH Notes: Knee Surgery, Bladder Surgery   NON-GU PSH: None   GU PMH: Interstitial Cystitis (w/o hematuria), Chronic interstitial cystitis without hematuria - 2017 Urinary Frequency, Increased urinary frequency - 2017 Weak Urinary Stream, Weak urinary stream - 2016 Nocturia, Nocturia - 2014 Urinary Urgency, Urinary urgency - 2014      PMH Notes:  2006-12-10 12:41:07 - Note: Anxiety  2006-12-10 12:41:07 - Note: Bipolar Disorder   NON-GU PMH: Encounter for general adult medical examination without abnormal findings, Encounter for preventive health examination - 2016 Asthma, Asthma - 2014 Personal history of diseases of the skin and subcutaneous tissue, History of psoriasis - 2014 Personal history of other diseases of the digestive system, History of esophageal reflux - 2014 Personal history of other endocrine, nutritional and metabolic disease, History of hypothyroidism - 2014 Personal history of other mental and behavioral disorders, History of depression - 2014 Seizure disorder, Convulsions (As Sx) - 2014  FAMILY HISTORY: None   SOCIAL HISTORY: Marital Status: Single Preferred Language: English; Ethnicity: Not Hispanic Or Latino; Race: White     Notes: Marital History - Single, Tobacco Use, Occupation:, Caffeine Use, Alcohol Use   REVIEW OF SYSTEMS:    GU Review Female:   Patient denies frequent urination, hard to postpone urination, burning /pain with urination, get up at night to urinate, leakage of urine, stream starts and stops, trouble starting your stream,  have to strain to urinate, and being pregnant.  Gastrointestinal (Upper):   Patient denies nausea, vomiting, and indigestion/ heartburn.  Gastrointestinal (Lower):   Patient denies diarrhea and constipation.  Constitutional:   Patient denies fever, night sweats, weight loss, and fatigue.  Skin:   Patient denies skin rash/ lesion and itching.  Eyes:   Patient denies blurred vision and double vision.  Ears/ Nose/ Throat:   Patient denies sore throat and sinus problems.  Hematologic/Lymphatic:   Patient denies swollen glands and easy bruising.  Cardiovascular:   Patient denies leg swelling and chest pains.  Respiratory:   Patient denies cough and shortness of breath.  Endocrine:   Patient denies excessive thirst.  Musculoskeletal:   Patient denies back pain and joint pain.  Neurological:   Patient denies headaches and dizziness.  Psychologic:   Patient denies depression and anxiety.   VITAL SIGNS:      02/29/2020 11:02 AM  Weight 123.5 lb / 56.02 kg  Height 59 in / 149.86 cm  BP 110/74 mmHg  Pulse 118 /min  Temperature 97.5 F / 36.3 C  BMI 24.9 kg/m   PAST DATA REVIEWED:  Source Of History:  Patient   PROCEDURES:          Urinalysis w/Scope Dipstick Dipstick Cont'd Micro  Color: Yellow Bilirubin: Neg mg/dL WBC/hpf: 0 - 5/hpf  Appearance: Clear Ketones: Neg mg/dL RBC/hpf: 0 - 2/hpf  Specific Gravity: 1.020 Blood: Neg ery/uL Bacteria: Rare (0-9/hpf)  pH: 6.0 Protein: Neg mg/dL Cystals: NS (Not Seen)  Glucose: Neg mg/dL Urobilinogen: 0.2 mg/dL Casts: NS (Not Seen)    Nitrites: Neg Trichomonas: Not Present    Leukocyte Esterase: Trace leu/uL Mucous: Not Present      Epithelial Cells: 0 - 5/hpf      Yeast: NS (Not Seen)      Sperm: Not Present    ASSESSMENT:      ICD-10 Details  1 GU:   Interstitial Cystitis (w/o hematuria) - N30.10   2   Urinary Frequency - R35.0      PLAN:            Medications Refill Meds: Oxybutynin Chloride Er 15 mg tablet, extended release 24 hr 1  tablet PO Daily   #90  3 Refill(s)  Tamsulosin Hcl 0.4 mg capsule 1 capsule PO Daily   #90  3 Refill(s)  Desmopressin Acetate 0.2 mg tablet 2 tablet PO Q HS   #180  3 Refill(s)     After a thorough review of the management options for the patient's condition the patient  elected to proceed with surgical therapy as noted above. We have discussed the potential benefits and risks of the procedure, side effects of the proposed treatment, the likelihood of the patient achieving the goals of the procedure, and any potential problems that might occur during the procedure or recuperation. Informed consent has been obtained.

## 2020-04-11 NOTE — Op Note (Signed)
Preoperative diagnosis: Refractory urgency incontinence Postoperative diagnosis: Refractory urgency incontinence Surgery: Implantation of InterStim stage I and stage II and impedance check with placement of rechargeable IPG and placement of TYRX Surgeon: Dr. Nicki Reaper Allyson Tineo  The patient has a complex past history.  She is above diagnosis and consented the above procedure.  She signed an infection.  The lead was an S3 in the IPG rechargeable it was in the right side originally.  Skin preparation as per protocol utilized.  Culture sensitive antibiotics used preoperative.  I used 10 cc of lidocaine epinephrine mixture and easily located S3 foramen on the right.  She had excellent toe and Bellow response with the foramen needle at low settings.  Guide was placed followed by skin incision.  White trocar was placed to appropriate depth.  Lead was placed to appropriate depth.  She had excellent motor responses in all 4-lead positions.  It was disengaged under fluoroscopic guidance in excellent position  She is a petite lady.  I marked out a new regional area for the new IPG.  I made the incision parallel to the original incision from a number of years ago with a larger size IPG approximately 2 cm above it still well below the iliac crest.  Scalpel incision made.  I dissected down approximately half an inch.  I made a nice little pocket.  I passed the guide from medial to lateral with the usual technique and tilted to the IPG.TYRX was used as a pouch.  It was placed in nicely.  Tension-free closure with running 3-0 Vicryl subcu tennis tissue followed by 4-0 Monocryl subcuticular.  Midline incision was closed with interrupted 4-0 Vicryl.  Usual dressing was applied.  Is very pleased with the surgery.  X-rays were taken.

## 2020-04-11 NOTE — Anesthesia Preprocedure Evaluation (Addendum)
Anesthesia Evaluation  Patient identified by MRN, date of birth, ID band Patient awake    Reviewed: Allergy & Precautions, NPO status , Patient's Chart, lab work & pertinent test results  History of Anesthesia Complications Negative for: history of anesthetic complications  Airway Mallampati: II  TM Distance: >3 FB Neck ROM: Full    Dental no notable dental hx. (+) Dental Advisory Given   Pulmonary asthma , Current SmokerPatient did not abstain from smoking.,    Pulmonary exam normal        Cardiovascular Exercise Tolerance: Good Normal cardiovascular exam     Neuro/Psych PSYCHIATRIC DISORDERS Anxiety Depression Bipolar Disorder negative neurological ROS     GI/Hepatic Neg liver ROS, GERD  Medicated,  Endo/Other  Hypothyroidism   Renal/GU    unrinary incontinence    Musculoskeletal   Abdominal   Peds  Hematology negative hematology ROS (+)   Anesthesia Other Findings All: see list  Reproductive/Obstetrics                            Anesthesia Physical  Anesthesia Plan  ASA: II  Anesthesia Plan: General   Post-op Pain Management:    Induction: Intravenous  PONV Risk Score and Plan: 3 and Treatment may vary due to age or medical condition, Ondansetron, Dexamethasone and Midazolam  Airway Management Planned: Oral ETT and LMA  Additional Equipment: None  Intra-op Plan:   Post-operative Plan: Extubation in OR  Informed Consent: I have reviewed the patients History and Physical, chart, labs and discussed the procedure including the risks, benefits and alternatives for the proposed anesthesia with the patient or authorized representative who has indicated his/her understanding and acceptance.     Dental advisory given  Plan Discussed with: Anesthesiologist and CRNA  Anesthesia Plan Comments:        Anesthesia Quick Evaluation

## 2020-04-11 NOTE — Anesthesia Postprocedure Evaluation (Signed)
Anesthesia Post Note  Patient: [name]  Procedure(s) Performed: Barrie Lyme IMPLANT FIRST STAGE (N/A ) INTERSTIM IMPLANT SECOND STAGE (N/A )     Patient location during evaluation: PACU Anesthesia Type: General Level of consciousness: awake and alert Pain management: pain level controlled Vital Signs Assessment: post-procedure vital signs reviewed and stable Respiratory status: spontaneous breathing and respiratory function stable Cardiovascular status: stable Postop Assessment: no apparent nausea or vomiting Anesthetic complications: no    Last Vitals:  Vitals:   04/11/20 1630 04/11/20 1645  BP: 127/78 120/76  Pulse: 80 85  Resp: (!) 22 20  Temp: 36.8 C 36.7 C  SpO2: 94% 96%    Last Pain:  Vitals:   04/11/20 1645  PainSc: 4                  Manya Balash DANIEL

## 2020-04-11 NOTE — Discharge Instructions (Signed)
I have reviewed discharge instructions in detail with the patient. They will follow-up with me or their physician as scheduled. My nurse will also be calling the patients as per protocol. I have reviewed discharge instructions in detail with the patient. They will follow-up with me or their physician as scheduled. My nurse will also be calling the patients as per protocol.  

## 2020-04-16 ENCOUNTER — Encounter: Payer: Self-pay | Admitting: *Deleted

## 2020-08-23 ENCOUNTER — Telehealth: Payer: Self-pay | Admitting: Pulmonary Disease

## 2020-08-23 NOTE — Telephone Encounter (Signed)
FYI - Order was placed by VS 09/15/19 for pt to have chest CT in 08/2020.  I scheduled CT & called to give pt appt info.  She states she does not want CT now and she will call & let us know when she is ready to get it.  I told her I would send a message to [facility] to make him aware.

## 2020-08-23 NOTE — Telephone Encounter (Signed)
Noted  

## 2020-08-23 NOTE — Telephone Encounter (Signed)
Forwarding to [facility] as FYI.

## 2020-09-07 ENCOUNTER — Other Ambulatory Visit: Payer: Medicare Other

## 2021-09-25 ENCOUNTER — Ambulatory Visit
Admission: RE | Admit: 2021-09-25 | Discharge: 2021-09-25 | Disposition: A | Discharge status: Home / Self Care | Payer: Medicare Other | Source: Ambulatory Visit | Attending: Physician Assistant | Admitting: Physician Assistant

## 2021-09-25 ENCOUNTER — Other Ambulatory Visit: Payer: Self-pay

## 2021-09-25 ENCOUNTER — Other Ambulatory Visit: Payer: Self-pay | Admitting: Physician Assistant

## 2021-09-25 DIAGNOSIS — M545 Low back pain, unspecified: Secondary | ICD-10-CM

## 2022-06-04 ENCOUNTER — Emergency Department (HOSPITAL_COMMUNITY): Payer: Medicare Other

## 2022-06-04 ENCOUNTER — Emergency Department (HOSPITAL_COMMUNITY)
Admission: EM | Admit: 2022-06-04 | Discharge: 2022-06-04 | Disposition: A | Discharge status: Home / Self Care | Payer: Medicare Other | Attending: Emergency Medicine | Admitting: Emergency Medicine

## 2022-06-04 ENCOUNTER — Encounter (HOSPITAL_COMMUNITY): Payer: Self-pay | Admitting: Emergency Medicine

## 2022-06-04 DIAGNOSIS — R220 Localized swelling, mass and lump, head: Secondary | ICD-10-CM | POA: Diagnosis present

## 2022-06-04 DIAGNOSIS — K047 Periapical abscess without sinus: Secondary | ICD-10-CM | POA: Diagnosis not present

## 2022-06-04 LAB — BASIC METABOLIC PANEL
Anion gap: 9 (ref 5–15)
BUN: 7 mg/dL (ref 6–20)
CO2: 26 mmol/L (ref 22–32)
Calcium: 9.7 mg/dL (ref 8.9–10.3)
Chloride: 100 mmol/L (ref 98–111)
Creatinine, Ser: 0.66 mg/dL (ref 0.44–1.00)
GFR, Estimated: >60 mL/min (ref >60)
Glucose, Bld: 107 mg/dL — ABNORMAL HIGH (ref 70–99)
Potassium: 4 mmol/L (ref 3.5–5.1)
Sodium: 135 mmol/L (ref 135–145)

## 2022-06-04 LAB — CBC WITH DIFFERENTIAL/PLATELET
Abs Immature Granulocytes: 0.05 10*3/uL (ref 0.00–0.07)
Basophils Absolute: 0.1 10*3/uL (ref 0.0–0.1)
Basophils Relative: 1 %
Eosinophils Absolute: 0.5 10*3/uL (ref 0.0–0.5)
Eosinophils Relative: 4 %
HCT: 42.5 % (ref 36.0–46.0)
Hemoglobin: 14.6 g/dL (ref 12.0–15.0)
Immature Granulocytes: 1 %
Lymphocytes Relative: 18 %
Lymphs Abs: 1.9 10*3/uL (ref 0.7–4.0)
MCH: 29.6 pg (ref 26.0–34.0)
MCHC: 34.4 g/dL (ref 30.0–36.0)
MCV: 86 fL (ref 80.0–100.0)
Monocytes Absolute: 0.5 10*3/uL (ref 0.1–1.0)
Monocytes Relative: 4 %
Neutro Abs: 7.8 10*3/uL — ABNORMAL HIGH (ref 1.7–7.7)
Neutrophils Relative %: 72 %
Platelets: 287 10*3/uL (ref 150–400)
RBC: 4.94 MIL/uL (ref 3.87–5.11)
RDW: 13.3 % (ref 11.5–15.5)
WBC: 10.9 10*3/uL — ABNORMAL HIGH (ref 4.0–10.5)
nRBC: 0 % (ref 0.0–0.2)

## 2022-06-04 MED ORDER — SODIUM CHLORIDE 0.9 % IV SOLN
1.5000 g | Freq: Once | INTRAVENOUS | Status: AC
  Administered 2022-06-04: 1.5 g via INTRAVENOUS
  Filled 2022-06-04: qty 4

## 2022-06-04 MED ORDER — IOHEXOL 300 MG/ML  SOLN
80.0000 mL | Freq: Once | INTRAMUSCULAR | Status: AC | PRN
  Administered 2022-06-04: 80 mL via INTRAVENOUS

## 2022-06-04 MED ORDER — AMOXICILLIN-POT CLAVULANATE 875-125 MG PO TABS: 1.0000 | ORAL_TABLET | Freq: Two times a day (BID) | ORAL | 0 refills | Status: AC

## 2022-06-04 NOTE — ED Triage Notes (Signed)
Patient reports sent from oral surgeon for further evaluation of submandibular swelling with concern for osteomyelitis.

## 2022-06-04 NOTE — ED Provider Notes (Signed)
[facility] DEPT Provider Note   CSN: [id number] Arrival date & time: 06/04/22  1132     History  Chief Complaint  Patient presents with   Oral Swelling    [name] is a 51 y.o. female.  51 year old female with prior medical history as detailed below presents for evaluation.  Patient with multiple episodes of submandibular swelling over the last 2 to 3 months.  Patient reports multiple rounds of antibiotics for same.  Patient reports that with antibiotics symptoms seem to improve but then recur.  Patient with history of dental implants.  Patient reports that she completed a round of antibiotics approximately 1 week ago.  Approximately 2 days ago she developed recurrent swelling.  She restarted clindamycin.  She reports that even with the clindamycin that she is currently taking she has not noted any improvement in the submandibular swelling and pain.  She denies fever.  Her dentist referred her to Dr. Donavan Burnet, oral surgeon with [facility], who saw her this morning.  Patient was sent to the ED for further evaluation and likely IV antibiotics.  The history is provided by the patient and medical records.  Illness Location:  Submandibular swelling - concern for abscess/osteo Severity:  Moderate Onset quality:  Gradual Duration:  3 days Timing:  Intermittent Progression:  Worsening Chronicity:  Recurrent      Home Medications Prior to Admission medications   Medication Sig Start Date End Date Taking? Authorizing Provider  ARIPiprazole (ABILIFY) 5 MG tablet Take 5 mg by mouth daily.    [provider]  desmopressin (DDAVP) 0.2 MG tablet Take 2 tablets (0.4 mg total) by mouth at bedtime. 06/15/15   Niel Hummer, NP  diclofenac sodium (VOLTAREN) 1 % GEL Apply 1 application topically once a week. Patient not taking: Reported on 02/15/2020 06/15/15   Niel Hummer, NP  doxepin (SINEQUAN) 75 MG capsule Take 150 mg by mouth  at bedtime.     [provider]  HYDROcodone-acetaminophen (NORCO) 5-325 MG tablet Take 1-2 tablets by mouth every 6 (six) hours as needed for moderate pain. 04/11/20   Bjorn Loser, MD  HYDROcodone-acetaminophen (NORCO) 5-325 MG tablet Take 1 tablet by mouth every 6 (six) hours as needed for moderate pain. 04/11/20   Bjorn Loser, MD  hydrOXYzine (VISTARIL) 50 MG capsule Take 50 mg by mouth 3 (three) times daily as needed for anxiety. 11/17/19   [provider]  Ipratropium-Albuterol (COMBIVENT RESPIMAT) 20-100 MCG/ACT AERS respimat Inhale 1 puff into the lungs every 6 (six) hours. One puff up to 4x daily Patient taking differently: Inhale 1 puff into the lungs every 6 (six) hours as needed for wheezing or shortness of breath.  10/02/15   Tanda Rockers, MD  levothyroxine (SYNTHROID, LEVOTHROID) 50 MCG tablet Take 1 tablet (50 mcg total) by mouth daily. Patient taking differently: Take 50 mcg by mouth daily before breakfast.  06/15/15   Niel Hummer, NP  omeprazole (PRILOSEC) 40 MG capsule Take 40 mg by mouth at bedtime.    [provider]  OVER THE COUNTER MEDICATION Take 2 tablets by mouth in the morning and at bedtime. Biodent otc supplement    [provider]  OVER THE COUNTER MEDICATION Take 2 tablets by mouth in the morning and at bedtime. Cataplex ACP otc supplement    [provider]  oxybutynin (DITROPAN XL) 15 MG 24 hr tablet Take 1 tablet (15 mg total) by mouth daily. Patient taking differently:  Take 15 mg by mouth at bedtime.  06/15/15   Niel Hummer, NP  sulfamethoxazole-trimethoprim (BACTRIM DS) 800-160 MG tablet Take 1 tablet by mouth 2 (two) times daily. 02/15/20   [provider]  tamsulosin (FLOMAX) 0.4 MG CAPS capsule Take 0.4 mg by mouth daily.    [provider]  tapentadol (NUCYNTA) 50 MG tablet Take 50 mg by mouth every 6 (six) hours as needed for moderate pain.    [provider]  vortioxetine  HBr (TRINTELLIX) 20 MG TABS tablet Take 20 mg by mouth daily.    [provider]      Allergies    Diphenhydramine, Silicone, Zyrtec [cetirizine hcl], Allegra [fexofenadine], Claritin [loratadine], Clindamycin/lincomycin, Chantix [varenicline], Propranolol, and Lamictal [lamotrigine]    Review of Systems   Review of Systems  All other systems reviewed and are negative.   Physical Exam Updated Vital Signs BP 117/67 (BP Location: Left Arm)   Pulse (!) 107   Temp 98.6 F (37 C) (Oral)   Resp 18   LMP 08/04/2016 (Approximate)   SpO2 93%  Physical Exam Vitals and nursing note reviewed.  Constitutional:      General: She is not in acute distress.    Appearance: Normal appearance. She is well-developed.  HENT:     Head: Normocephalic and atraumatic.     Mouth/Throat:     Comments: Moderate submandibular swelling (approx 2x3 cm) with tenderness Eyes:     Conjunctiva/sclera: Conjunctivae normal.     Pupils: Pupils are equal, round, and reactive to light.  Cardiovascular:     Rate and Rhythm: Normal rate and regular rhythm.     Heart sounds: Normal heart sounds.  Pulmonary:     Effort: Pulmonary effort is normal. No respiratory distress.     Breath sounds: Normal breath sounds.  Abdominal:     General: There is no distension.     Palpations: Abdomen is soft.     Tenderness: There is no abdominal tenderness.  Musculoskeletal:        General: No deformity. Normal range of motion.     Cervical back: Normal range of motion and neck supple.  Skin:    General: Skin is warm and dry.  Neurological:     General: No focal deficit present.     Mental Status: She is alert and oriented to person, place, and time.     ED Results / Procedures / Treatments   Labs (all labs ordered are listed, but only abnormal results are displayed) Labs Reviewed  BASIC METABOLIC PANEL  CBC WITH DIFFERENTIAL/PLATELET    EKG None  Radiology No results found.  Procedures Procedures     Medications Ordered in ED Medications  ampicillin-sulbactam (UNASYN) 1.5 g in sodium chloride 0.9 % 100 mL IVPB (has no administration in time range)    ED Course/ Medical Decision Making/ A&P                           Medical Decision Making Amount and/or Complexity of Data Reviewed Labs: ordered. Radiology: ordered.  Risk Prescription drug management.    Medical Screen Complete  This patient presented to the ED with complaint of submental swelling.  This complaint involves an extensive number of treatment options. The initial differential diagnosis includes, but is not limited to, abscess  This presentation is: Acute, Chronic, Self-Limited, Previously Undiagnosed, Uncertain Prognosis, Complicated, Systemic Symptoms, and Threat to Life/Bodily Function  Patient presents for evaluation of  submandibular swelling.  Patient with history consistent with likely recurrent dental abscess.  Patient was sent from the office of Dr. Elinor Parkinson oral maxillofacial surgeon.  Patient is nontoxic in appearance.  Screening labs obtained are without significant abnormality.  CT imaging reveals evidence of submental abscess.  Patient declines admission.  She does understand that close outpatient follow-up with Dr. Elinor Parkinson is imperative so that this abscess can be drained appropriately.  Patient's case discussed briefly with Dr. Benson Norway (so that patient could have backup oral surgeon available if she cannot get into see Dr. Elinor Parkinson).  Patient advised to stop taking clindamycin.  She was given a prescription for Augmentin.  She absolutely understands the need for close follow-up with oral maxillofacial surgery.  She is advised to return to the ED or seek medical attention if symptoms worsen or other concerning symptoms develop.  Additional history obtained:  External records from outside sources obtained and reviewed including prior ED visits and prior Inpatient records.    Lab Tests:  I  ordered and personally interpreted labs.  The pertinent results include: CBC, BMP   Imaging Studies ordered:  I ordered imaging studies including CT soft tissue neck I independently visualized and interpreted obtained imaging which showed submental abscess I agree with the radiologist interpretation.   Cardiac Monitoring:  The patient was maintained on a cardiac monitor.  I personally viewed and interpreted the cardiac monitor which showed an underlying rhythm of: NSR   Medicines ordered:  I ordered medication including Unasyn for suspected dental abscess Reevaluation of the patient after these medicines showed that the patient: improved    Problem List / ED Course:  Submental abscess   Reevaluation:  After the interventions noted above, I reevaluated the patient and found that they have: stayed the same   Disposition:  After consideration of the diagnostic results and the patients response to treatment, I feel that the patent would benefit from close outpatient follow-up.          Final Clinical Impression(s) / ED Diagnoses Final diagnoses:  Dental abscess    Rx / DC Orders ED Discharge Orders          Ordered    amoxicillin-clavulanate (AUGMENTIN) 875-125 MG tablet  Every 12 hours        06/04/22 1445              Valarie Merino, MD 06/04/22 1503

## 2022-06-04 NOTE — Discharge Instructions (Addendum)
Return for any problem.  Follow-up closely with Dr. Elinor Parkinson tomorrow.  Your labs today were reassuringly normal (WBC 10.9). CT imaging demonstrated abscess that will require drainage.  If you have difficulty following up with Dr. Elinor Parkinson, you may see Dr. Frederik Schmidt (Oral Surgeon). His number is [phone] and he is located at [place] [address].  Stop taking clindamycin.  Take Augmentin as prescribed.

## 2022-11-13 ENCOUNTER — Ambulatory Visit
Admission: RE | Admit: 2022-11-13 | Discharge: 2022-11-13 | Disposition: A | Discharge status: Home / Self Care | Payer: Medicare Other | Source: Ambulatory Visit | Attending: Nurse Practitioner | Admitting: Nurse Practitioner

## 2022-11-13 ENCOUNTER — Other Ambulatory Visit: Payer: Self-pay | Admitting: Nurse Practitioner

## 2022-11-13 DIAGNOSIS — M533 Sacrococcygeal disorders, not elsewhere classified: Secondary | ICD-10-CM

## 2022-11-13 DIAGNOSIS — M25551 Pain in right hip: Secondary | ICD-10-CM

## 2022-12-19 ENCOUNTER — Encounter (HOSPITAL_COMMUNITY): Payer: Self-pay

## 2022-12-19 ENCOUNTER — Emergency Department (HOSPITAL_COMMUNITY): Payer: 59

## 2022-12-19 ENCOUNTER — Emergency Department (HOSPITAL_COMMUNITY)
Admission: EM | Admit: 2022-12-19 | Discharge: 2022-12-19 | Disposition: A | Discharge status: Home / Self Care | Payer: 59 | Attending: Emergency Medicine | Admitting: Emergency Medicine

## 2022-12-19 ENCOUNTER — Other Ambulatory Visit: Payer: Self-pay | Admitting: Rehabilitation

## 2022-12-19 ENCOUNTER — Emergency Department (HOSPITAL_BASED_OUTPATIENT_CLINIC_OR_DEPARTMENT_OTHER): Payer: 59

## 2022-12-19 ENCOUNTER — Other Ambulatory Visit: Payer: Self-pay

## 2022-12-19 DIAGNOSIS — M79675 Pain in left toe(s): Secondary | ICD-10-CM | POA: Diagnosis not present

## 2022-12-19 DIAGNOSIS — W19XXXA Unspecified fall, initial encounter: Secondary | ICD-10-CM

## 2022-12-19 DIAGNOSIS — W010XXA Fall on same level from slipping, tripping and stumbling without subsequent striking against object, initial encounter: Secondary | ICD-10-CM | POA: Insufficient documentation

## 2022-12-19 DIAGNOSIS — M25521 Pain in right elbow: Secondary | ICD-10-CM | POA: Diagnosis present

## 2022-12-19 DIAGNOSIS — F17200 Nicotine dependence, unspecified, uncomplicated: Secondary | ICD-10-CM | POA: Insufficient documentation

## 2022-12-19 DIAGNOSIS — M25562 Pain in left knee: Secondary | ICD-10-CM | POA: Diagnosis not present

## 2022-12-19 DIAGNOSIS — M4316 Spondylolisthesis, lumbar region: Secondary | ICD-10-CM

## 2022-12-19 DIAGNOSIS — M79662 Pain in left lower leg: Secondary | ICD-10-CM | POA: Insufficient documentation

## 2022-12-19 DIAGNOSIS — R52 Pain, unspecified: Secondary | ICD-10-CM | POA: Diagnosis not present

## 2022-12-19 DIAGNOSIS — M4310 Spondylolisthesis, site unspecified: Secondary | ICD-10-CM

## 2022-12-19 NOTE — Discharge Instructions (Signed)
You are seen today in the emergency department due to a fall.  Your workup today was very reassuring with fractures or dislocations.  You can take Tylenol for pain, take the medications as needed for stronger pain.  Return to the ED for new or concerning symptoms.

## 2022-12-19 NOTE — Progress Notes (Signed)
Left lower extremity venous duplex has been completed. Preliminary results can be found in CV Proc through chart review.  Results were given to [facility] PA.  12/19/22 8:43 AM Carlos Levering RVT

## 2022-12-19 NOTE — ED Triage Notes (Signed)
Pt had a fall Wednesday, tripping over a cat tower and falling onto her glass table. Pt is complaining of left leg pain, left big toe and pinky toe pain, and right arm pain.

## 2022-12-19 NOTE — ED Provider Notes (Signed)
[facility] DEPT Provider Note   CSN: [id number] Arrival date & time: 12/19/22  6195     History  Chief Complaint  Patient presents with   [name]    [name] is a 52 y.o. female.   Fall     Patient with medical history of tobacco abuse, pulmonary fibrosis, anxiety, bipolar 1 presents to the emergency department due to fall.  Patient states 2 days ago she had a mechanical fall where she tripped over a cat tower.  Not on blood thinners, did not hit head or lose consciousness.  She has pain to her right elbow, left little toe and left knee.  These are worse with any movement.   States this morning she had left calf pain which she has not had previously which has been constant, is not worsened by movement but it feels like something is "protruding out of it".  She has not traveled recently, no history of DVTs or clots, denies any recent immobilization.  Home Medications Prior to Admission medications   Medication Sig Start Date End Date Taking? Authorizing Provider  amoxicillin-clavulanate (AUGMENTIN) 875-125 MG tablet Take 1 tablet by mouth every 12 (twelve) hours. 06/04/22   Valarie Merino, MD  ARIPiprazole (ABILIFY) 5 MG tablet Take 5 mg by mouth daily.    [provider]  desmopressin (DDAVP) 0.2 MG tablet Take 2 tablets (0.4 mg total) by mouth at bedtime. 06/15/15   Niel Hummer, NP  diclofenac sodium (VOLTAREN) 1 % GEL Apply 1 application topically once a week. Patient not taking: Reported on 02/15/2020 06/15/15   Niel Hummer, NP  doxepin (SINEQUAN) 75 MG capsule Take 150 mg by mouth at bedtime.     [provider]  HYDROcodone-acetaminophen (NORCO) 5-325 MG tablet Take 1-2 tablets by mouth every 6 (six) hours as needed for moderate pain. 04/11/20   Bjorn Loser, MD  HYDROcodone-acetaminophen (NORCO) 5-325 MG tablet Take 1 tablet by mouth every 6 (six) hours as needed for moderate pain. 04/11/20   Bjorn Loser, MD   hydrOXYzine (VISTARIL) 50 MG capsule Take 50 mg by mouth 3 (three) times daily as needed for anxiety. 11/17/19   [provider]  Ipratropium-Albuterol (COMBIVENT RESPIMAT) 20-100 MCG/ACT AERS respimat Inhale 1 puff into the lungs every 6 (six) hours. One puff up to 4x daily Patient taking differently: Inhale 1 puff into the lungs every 6 (six) hours as needed for wheezing or shortness of breath.  10/02/15   Tanda Rockers, MD  levothyroxine (SYNTHROID, LEVOTHROID) 50 MCG tablet Take 1 tablet (50 mcg total) by mouth daily. Patient taking differently: Take 50 mcg by mouth daily before breakfast.  06/15/15   Niel Hummer, NP  omeprazole (PRILOSEC) 40 MG capsule Take 40 mg by mouth at bedtime.    [provider]  OVER THE COUNTER MEDICATION Take 2 tablets by mouth in the morning and at bedtime. Biodent otc supplement    [provider]  OVER THE COUNTER MEDICATION Take 2 tablets by mouth in the morning and at bedtime. Cataplex ACP otc supplement    [provider]  oxybutynin (DITROPAN XL) 15 MG 24 hr tablet Take 1 tablet (15 mg total) by mouth daily. Patient taking differently: Take 15 mg by mouth at bedtime.  06/15/15   Niel Hummer, NP  sulfamethoxazole-trimethoprim (BACTRIM DS) 800-160 MG tablet Take 1 tablet by mouth 2 (two) times daily. 02/15/20   [provider]  tamsulosin (FLOMAX) 0.4 MG  CAPS capsule Take 0.4 mg by mouth daily.    [provider]  tapentadol (NUCYNTA) 50 MG tablet Take 50 mg by mouth every 6 (six) hours as needed for moderate pain.    [provider]  vortioxetine HBr (TRINTELLIX) 20 MG TABS tablet Take 20 mg by mouth daily.    [provider]      Allergies    Diphenhydramine, Silicone, Zyrtec [cetirizine hcl], Allegra [fexofenadine], Claritin [loratadine], Clindamycin/lincomycin, Chantix [varenicline], Propranolol, and Lamictal [lamotrigine]    Review of Systems   Review of Systems  Physical  Exam Updated Vital Signs BP 112/76   Pulse 80   Temp 98 F (36.7 C) (Oral)   Resp 19   Ht '4\' 10"'$  (1.473 m)   Wt 53.1 kg   LMP 08/04/2016 (Approximate)   SpO2 96%   BMI 24.45 kg/m  Physical Exam Vitals and nursing note reviewed. Exam conducted with a chaperone present.  Constitutional:      Appearance: Normal appearance.  HENT:     Head: Normocephalic and atraumatic.  Eyes:     General: No scleral icterus.       Right eye: No discharge.        Left eye: No discharge.     Extraocular Movements: Extraocular movements intact.     Pupils: Pupils are equal, round, and reactive to light.  Cardiovascular:     Rate and Rhythm: Normal rate and regular rhythm.     Pulses: Normal pulses.     Heart sounds: Normal heart sounds.     No friction rub. No gallop.     Comments: Upper and lower extremities 2+ symmetric bilaterally Pulmonary:     Effort: Pulmonary effort is normal.     Breath sounds: Rales present.     Comments: Fine rales Abdominal:     General: Abdomen is flat. Bowel sounds are normal. There is no distension.     Palpations: Abdomen is soft.     Tenderness: There is no abdominal tenderness.  Musculoskeletal:        General: Tenderness present.     Right lower leg: No edema.     Left lower leg: No edema.     Comments: Tenderness over right elbow, left big toe and left knee.  Able to tolerate passive ROM and active ROM is intact although painful.  No significant swelling, contusion, abrasion or crepitus.  Moving all other limbs without difficulty.  Tenderness with palpation of the posterior left calf  Skin:    General: Skin is warm and dry.     Capillary Refill: Capillary refill takes less than 2 seconds.     Coloration: Skin is not jaundiced.     Findings: No erythema.  Neurological:     Mental Status: She is alert. Mental status is at baseline.     Coordination: Coordination normal.     ED Results / Procedures / Treatments   Labs (all labs ordered are listed,  but only abnormal results are displayed) Labs Reviewed - No data to display  EKG None  Radiology VAS [place] LOWER EXTREMITY VENOUS (DVT) (7a-7p)  Result Date: 12/19/2022  Lower Venous DVT Study Patient Name:  JAMIEE MILHOLLAND  Date of Exam:   12/19/2022 Medical Rec #: [id number]           Accession #:    [id number] Date of Birth: 03-Sep-1971           Patient Gender: F Patient Age:   79 years Exam  Location:  [facility] Procedure:      VAS [place] LOWER EXTREMITY VENOUS (DVT) Referring Phys: Roselinda Bahena --------------------------------------------------------------------------------  Indications: Pain.  Risk Factors: None identified. Comparison Study: No prior studies. Performing Technologist: Oliver Hum RVT  Examination Guidelines: A complete evaluation includes B-mode imaging, spectral Doppler, color Doppler, and power Doppler as needed of all accessible portions of each vessel. Bilateral testing is considered an integral part of a complete examination. Limited examinations for reoccurring indications may be performed as noted. The reflux portion of the exam is performed with the patient in reverse Trendelenburg.  +-----+---------------+---------+-----------+----------+--------------+ RIGHTCompressibilityPhasicitySpontaneityPropertiesThrombus Aging +-----+---------------+---------+-----------+----------+--------------+ CFV  Full           Yes      Yes                                 +-----+---------------+---------+-----------+----------+--------------+   +---------+---------------+---------+-----------+----------+--------------+ LEFT     CompressibilityPhasicitySpontaneityPropertiesThrombus Aging +---------+---------------+---------+-----------+----------+--------------+ CFV      Full           Yes      Yes                                 +---------+---------------+---------+-----------+----------+--------------+ SFJ      Full                                                         +---------+---------------+---------+-----------+----------+--------------+ FV Prox  Full                                                        +---------+---------------+---------+-----------+----------+--------------+ FV Mid   Full                                                        +---------+---------------+---------+-----------+----------+--------------+ FV DistalFull                                                        +---------+---------------+---------+-----------+----------+--------------+ PFV      Full                                                        +---------+---------------+---------+-----------+----------+--------------+ POP      Full           Yes      Yes                                 +---------+---------------+---------+-----------+----------+--------------+ PTV      Full                                                        +---------+---------------+---------+-----------+----------+--------------+  PERO     Full                                                        +---------+---------------+---------+-----------+----------+--------------+     Summary: RIGHT: - No evidence of common femoral vein obstruction.  LEFT: - There is no evidence of deep vein thrombosis in the lower extremity.  - No cystic structure found in the popliteal fossa.  *See table(s) above for measurements and observations.    Preliminary    DG Elbow Complete Right  Result Date: 12/19/2022 CLINICAL DATA:  Fall EXAM: RIGHT ELBOW - COMPLETE 3+ VIEW COMPARISON:  None Available. FINDINGS: There is no acute fracture or dislocation. Elbow alignment is maintained. The joint spaces are preserved. There is no erosive change. The soft tissues are unremarkable. There is no effusion. IMPRESSION: No acute fracture or dislocation Electronically Signed   By: Valetta Mole M.D.   On: 12/19/2022 08:09   DG Foot Complete Left  Result Date: 12/19/2022 CLINICAL  DATA:  Fall EXAM: LEFT FOOT - COMPLETE 3 VIEW COMPARISON:  None Available. FINDINGS: There is no evidence of fracture or dislocation. There is no evidence of arthropathy or other focal bone abnormality. Soft tissues are unremarkable. IMPRESSION: Negative. Electronically Signed   By: Yetta Glassman M.D.   On: 12/19/2022 08:08   DG Knee Complete 4 Views Left  Result Date: 12/19/2022 CLINICAL DATA:  Fall.  Left posterior knee pain. EXAM: LEFT KNEE - COMPLETE 4+ VIEW COMPARISON:  None Available. FINDINGS: Postsurgical changes reflecting left knee arthroplasty are seen. Hardware alignment is within expected limits, without evidence of complication. There is no acute fracture or dislocation. The soft tissues are unremarkable. There is no effusion. IMPRESSION: Status post left knee arthroplasty without evidence of acute injury. Electronically Signed   By: Valetta Mole M.D.   On: 12/19/2022 08:08    Procedures Procedures    Medications Ordered in ED Medications - No data to display  ED Course/ Medical Decision Making/ A&P Clinical Course as of 12/19/22 0906  Fri Dec 19, 2022  0827 DG Elbow Complete Right Negative  [HS]  0828 DG Foot Complete Left Negative  [HS]  0828 DG Knee Complete 4 Views Left Negative  [HS]    Clinical Course User Index [HS] Sherrill Raring, PA-C                             Medical Decision Making Amount and/or Complexity of Data Reviewed Radiology: ordered. Decision-making details documented in ED Course.   This is a 52 year old female presenting to the emergency department due to fall.  Differential includes fracture, dislocation, muscle strain, DVT  On exam she is neurovascularly intact with brisk refill, no crepitus and she is able to ambulate.  No obvious deformities, 2+ pulses in upper and lower extremities without difficulty.  Presentation is not consistent with compartment syndrome, septic joint, dislocation. -BP 109/74   Pulse 77   Temp 99 F (37.2 C) (Oral)    Resp 16   Ht '4\' 10"'$  (1.473 m)   Wt 53.1 kg   LMP 08/04/2016 (Approximate)   SpO2 93%   BMI 24.45 kg/m   Although I think DVT is less likely on physical exam the story does not seem quite clear-cut for muscle  strain especially given the location and the fact that started today as opposed to 2 days ago and the fall initially happened.  Her Wells score is 1 due to localized tenderness along the DVT.  Will proceed with ultrasound as well as radiographic imaging.  Imaging studies are negative, agree with radiologist.  Suspect muscle pain just from the fall, doubt any emergent traumatic process.  We discussed the workup and outpatient follow-up plan as well as return precautions.  Stable for discharge at this time.        Final Clinical Impression(s) / ED Diagnoses Final diagnoses:  Fall, initial encounter    Rx / DC Orders ED Discharge Orders     None         Sherrill Raring, [place] 12/19/22 8882    Dorie Rank, MD 12/20/22 [id number]

## 2022-12-30 ENCOUNTER — Ambulatory Visit
Admission: RE | Admit: 2022-12-30 | Discharge: 2022-12-30 | Disposition: A | Discharge status: Home / Self Care | Payer: 59 | Source: Ambulatory Visit | Attending: Rehabilitation | Admitting: Rehabilitation

## 2022-12-30 DIAGNOSIS — M4316 Spondylolisthesis, lumbar region: Secondary | ICD-10-CM

## 2022-12-30 MED ORDER — MEPERIDINE HCL 50 MG/ML IJ SOLN: 50.0000 mg | Freq: Once | INTRAMUSCULAR | Status: DC | PRN

## 2022-12-30 MED ORDER — ONDANSETRON HCL 4 MG/2ML IJ SOLN: 4.0000 mg | Freq: Once | INTRAMUSCULAR | Status: DC | PRN

## 2022-12-30 MED ORDER — DIAZEPAM 5 MG PO TABS: 10.0000 mg | ORAL_TABLET | Freq: Once | ORAL | Status: DC

## 2022-12-30 MED ORDER — IOPAMIDOL (ISOVUE-M 200) INJECTION 41%
20.0000 mL | Freq: Once | INTRAMUSCULAR | Status: AC
  Administered 2022-12-30: 20 mL via INTRATHECAL

## 2022-12-30 NOTE — Discharge Instructions (Signed)

## 2022-12-30 NOTE — Progress Notes (Signed)
Pt has bladder stimulator and reports her device is CT safe, she has had previous CT scans without turning device off, she also notes that her device has not been charged in several months and device is turned it off for her CT myelogram procedure.
